# Patient Record
Sex: Female | Born: 1955 | Race: Black or African American | Hispanic: No | Smoking: Former smoker
Health system: Southern US, Community
[De-identification: ages and names within clinical notes are randomized; demographics above are authoritative.]

## PROBLEM LIST (undated history)

## (undated) DIAGNOSIS — F32A Depression, unspecified: Secondary | ICD-10-CM

## (undated) DIAGNOSIS — F419 Anxiety disorder, unspecified: Secondary | ICD-10-CM

## (undated) DIAGNOSIS — M5030 Other cervical disc degeneration, unspecified cervical region: Secondary | ICD-10-CM

## (undated) DIAGNOSIS — M810 Age-related osteoporosis without current pathological fracture: Secondary | ICD-10-CM

## (undated) DIAGNOSIS — M797 Fibromyalgia: Secondary | ICD-10-CM

## (undated) DIAGNOSIS — M179 Osteoarthritis of knee, unspecified: Secondary | ICD-10-CM

## (undated) DIAGNOSIS — K635 Polyp of colon: Secondary | ICD-10-CM

## (undated) DIAGNOSIS — E785 Hyperlipidemia, unspecified: Secondary | ICD-10-CM

## (undated) DIAGNOSIS — E042 Nontoxic multinodular goiter: Secondary | ICD-10-CM

## (undated) DIAGNOSIS — M1710 Unilateral primary osteoarthritis, unspecified knee: Secondary | ICD-10-CM

## (undated) DIAGNOSIS — K219 Gastro-esophageal reflux disease without esophagitis: Secondary | ICD-10-CM

## (undated) DIAGNOSIS — M069 Rheumatoid arthritis, unspecified: Secondary | ICD-10-CM

## (undated) DIAGNOSIS — M940 Chondrocostal junction syndrome [Tietze]: Secondary | ICD-10-CM

## (undated) DIAGNOSIS — D472 Monoclonal gammopathy: Secondary | ICD-10-CM

## (undated) HISTORY — DX: Anxiety disorder, unspecified: F41.9

## (undated) HISTORY — DX: Hyperlipidemia, unspecified: E78.5

## (undated) HISTORY — DX: Rheumatoid arthritis, unspecified: M06.9

## (undated) HISTORY — DX: Other cervical disc degeneration, unspecified cervical region: M50.30

## (undated) HISTORY — DX: Unilateral primary osteoarthritis, unspecified knee: M17.10

## (undated) HISTORY — DX: Fibromyalgia: M79.7

## (undated) HISTORY — PX: POLYPECTOMY: SHX149

## (undated) HISTORY — DX: Age-related osteoporosis without current pathological fracture: M81.0

## (undated) HISTORY — DX: Monoclonal gammopathy: D47.2

## (undated) HISTORY — DX: Depression, unspecified: F32.A

## (undated) HISTORY — DX: Polyp of colon: K63.5

## (undated) HISTORY — DX: Osteoarthritis of knee, unspecified: M17.9

## (undated) HISTORY — DX: Chondrocostal junction syndrome (tietze): M94.0

## (undated) HISTORY — DX: Nontoxic multinodular goiter: E04.2

---

## 2010-02-09 HISTORY — PX: COLONOSCOPY: SHX174

## 2013-02-09 HISTORY — PX: COLONOSCOPY: SHX174

## 2016-07-24 ENCOUNTER — Encounter: Payer: Self-pay | Admitting: Internal Medicine

## 2016-07-24 ENCOUNTER — Ambulatory Visit: Payer: BLUE CROSS/BLUE SHIELD | Attending: Internal Medicine | Admitting: Internal Medicine

## 2016-07-24 VITALS — BP 129/78 | HR 78 | Temp 98.0°F | Resp 17 | Ht 65.9 in | Wt 133.0 lb

## 2016-07-24 DIAGNOSIS — Z8249 Family history of ischemic heart disease and other diseases of the circulatory system: Secondary | ICD-10-CM | POA: Insufficient documentation

## 2016-07-24 DIAGNOSIS — Z8371 Family history of colonic polyps: Secondary | ICD-10-CM | POA: Diagnosis not present

## 2016-07-24 DIAGNOSIS — H209 Unspecified iridocyclitis: Secondary | ICD-10-CM | POA: Diagnosis not present

## 2016-07-24 DIAGNOSIS — M069 Rheumatoid arthritis, unspecified: Secondary | ICD-10-CM | POA: Insufficient documentation

## 2016-07-24 DIAGNOSIS — E042 Nontoxic multinodular goiter: Secondary | ICD-10-CM | POA: Insufficient documentation

## 2016-07-24 DIAGNOSIS — M940 Chondrocostal junction syndrome [Tietze]: Secondary | ICD-10-CM | POA: Insufficient documentation

## 2016-07-24 DIAGNOSIS — E559 Vitamin D deficiency, unspecified: Secondary | ICD-10-CM | POA: Diagnosis not present

## 2016-07-24 DIAGNOSIS — Z87898 Personal history of other specified conditions: Secondary | ICD-10-CM

## 2016-07-24 DIAGNOSIS — H2013 Chronic iridocyclitis, bilateral: Secondary | ICD-10-CM | POA: Diagnosis not present

## 2016-07-24 DIAGNOSIS — E785 Hyperlipidemia, unspecified: Secondary | ICD-10-CM | POA: Insufficient documentation

## 2016-07-24 DIAGNOSIS — Z86010 Personal history of colonic polyps: Secondary | ICD-10-CM | POA: Diagnosis not present

## 2016-07-24 DIAGNOSIS — Z880 Allergy status to penicillin: Secondary | ICD-10-CM | POA: Diagnosis not present

## 2016-07-24 DIAGNOSIS — Z87891 Personal history of nicotine dependence: Secondary | ICD-10-CM | POA: Diagnosis not present

## 2016-07-24 DIAGNOSIS — M179 Osteoarthritis of knee, unspecified: Secondary | ICD-10-CM | POA: Insufficient documentation

## 2016-07-24 DIAGNOSIS — R05 Cough: Secondary | ICD-10-CM | POA: Insufficient documentation

## 2016-07-24 DIAGNOSIS — M810 Age-related osteoporosis without current pathological fracture: Secondary | ICD-10-CM | POA: Diagnosis not present

## 2016-07-24 DIAGNOSIS — M1710 Unilateral primary osteoarthritis, unspecified knee: Secondary | ICD-10-CM | POA: Diagnosis not present

## 2016-07-24 DIAGNOSIS — E7849 Other hyperlipidemia: Secondary | ICD-10-CM | POA: Insufficient documentation

## 2016-07-24 DIAGNOSIS — K635 Polyp of colon: Secondary | ICD-10-CM

## 2016-07-24 DIAGNOSIS — D472 Monoclonal gammopathy: Secondary | ICD-10-CM | POA: Insufficient documentation

## 2016-07-24 DIAGNOSIS — Z8742 Personal history of other diseases of the female genital tract: Secondary | ICD-10-CM | POA: Insufficient documentation

## 2016-07-24 DIAGNOSIS — M5412 Radiculopathy, cervical region: Secondary | ICD-10-CM

## 2016-07-24 MED ORDER — PRAVASTATIN SODIUM 20 MG PO TABS: ORAL_TABLET | ORAL | 3 refills | Status: DC

## 2016-07-24 MED ORDER — FOLIC ACID 1 MG PO TABS: 1.0000 mg | ORAL_TABLET | Freq: Every day | ORAL | 2 refills | Status: DC

## 2016-07-24 MED ORDER — PREDNISONE 1 MG PO TABS: 4.0000 mg | ORAL_TABLET | Freq: Every day | ORAL | 2 refills | Status: DC

## 2016-07-24 NOTE — Patient Instructions (Signed)
Please sign a release for [place] to get your records from your previous primary physician in [place].  I have submitted a referral for you to see the rheumatologist.  Restart Pravachol. Take it every other day. Please let me know if you tolerate the medication.

## 2016-07-24 NOTE — Progress Notes (Signed)
Patient ID: Vickie Pearson, female    DOB: 1955/05/28  MRN: [id number]  CC: Establish Care and Arthritis (Needs medication)   Subjective: Vickie Pearson is a 61 y.o. female who presents for new patient visit. Her concerns today include:  Relocated from [place] to [place] end of April. Hx of RA, osteoporosis, degenerative disc disease of the C-spine, OA knee, osteoporosis, HL, vit D def, thyroid nodules chronic iritis of both eyes and monoclonal gammopathy.  She brings a summary of her medical history with her from her previous physician Dr. Jenene Slicker  1. Taking Z-pack since yesterday.  Seen at [facility] for upper respiratory symptoms. Diagnosed with sinusitis.  Also reports being told that she has costochondritis as she was having some tenderness in the chest with coughing.    2. RA: -Was being followed by rheumatologist. Currently on methotrexate 25 mg weekly, folic acid, prednisone 4 mg daily. The prednisone was being tapered. -had flare day after she moved to [place]. Hands, wrist and knees swollen.  Inc. Pred to 8 mg for 3 days with good results Requesting referral to a rheumatologist here in the area  3.  Osteoporosis/Vit D def:  Taking vit d 4000 QOD. -Had Reclast injection a few mths ago -Reports having had a bone density study prior to injection   4.  Reports some radicular symptoms (numbness and tingling) in the left arm and hand. Had MRI done earlier this year that revealed DDD of the C-spine C5-7 -Prescribed gabapentin but she takes only as needed  5.  HL: she stop taking Pravachol x 1 yr -cause cramping in legs. Last lipid profile done 1/2018T cholesterol 289/LDL 180  6.  Thyroid nodules: Discovered to have several nodules in the left lobe in 2014. Sizes were [phone] cm inferior complex nodule, left middle lobe subcentimeter nodule, left superior lobe complex nodule [phone] cm. Seen by endocrinologist. No FNA recommended. Repeat  ultrasound in 2016 showed that the nodules were smaller in size. Last ultrasound done 03/2016 nodules were stable. Recommended repeat ultrasound in 2 years.  HM: Last Pap March/2018 was normal per patient she will need repeat in 5 years. Does have a history of ASCUS favoring dysplasia in 2010 with colpo biopsy showing benign findings at that time. Colonoscopy/due again in 2020. History of colon polyps. MMG done 05/2016  Patient Active Problem List   Diagnosis Date Noted  . Rheumatoid arthritis involving multiple sites ([place]) 07/24/2016  . Cervical radiculopathy 07/24/2016  . Hyperlipidemia 07/24/2016  . Osteoporosis 07/24/2016  . Vitamin D deficiency 07/24/2016  . Multiple thyroid nodules 07/24/2016     No current outpatient prescriptions on file prior to visit.   No current facility-administered medications on file prior to visit.     Allergies  Allergen Reactions  . Penicillins Hives  . Piroxicam Hives  . Sulfa Antibiotics Hives    Social History   Social History  . Marital status: Widowed    Spouse name: N/A  . Number of children: N/A  . Years of education: N/A   Occupational History  . Not on file.   Social History Main Topics  . Smoking status: Former Smoker    Quit date: 07/24/1988  . Smokeless tobacco: Never Used  . Alcohol use No  . Drug use: No  . Sexual activity: Not on file   Other Topics Concern  . Not on file   Social History Narrative  . No narrative on file    Family History  Problem Relation Age  of Onset  . Hypertension Mother   . Colon polyps Mother   . Hypertension Father     History reviewed. No pertinent surgical history.  ROS: Review of Systems  Constitutional: Negative for activity change and appetite change.  Respiratory: Positive for cough. Negative for chest tightness and shortness of breath.   Cardiovascular: Negative for chest pain.  Musculoskeletal: Positive for neck pain. Negative for arthralgias.    PHYSICAL EXAM: BP  129/78 (BP Location: Left Arm, Patient Position: Sitting, Cuff Size: Normal)   Pulse 78   Temp 98 F (36.7 C) (Oral)   Resp 17   Ht 5' 5.9" (1.674 m)   Wt 133 lb (60.3 kg)   SpO2 98%   BMI 21.53 kg/m   Physical Exam  General appearance - alert, well appearing, older AAF and in no distress Mental status - alert, oriented to person, place, and time, normal mood, behavior, speech, dress, motor activity, and thought processes Mouth -dentures above Neck - supple, no significant adenopathy. No thyroid enlargement Chest - clear to auscultation, no wheezes, rales or rhonchi, symmetric air entry Heart - normal rate, regular rhythm, normal S1, S2, no murmurs, rubs, clicks or gallops Musculoskeletal - mild enlargement of PIP jts most pronounce LT index. No signs of active inflam. Extremities - peripheral pulses normal, no pedal edema, no clubbing or cyanosis  ASSESSMENT AND PLAN: 1. Rheumatoid arthritis involving multiple sites, unspecified rheumatoid factor presence ([place]) -Patient to continue her current medications until she gets established with a rheumatologist in the area. Currently she has no signs of disease activity - predniSONE (DELTASONE) 1 MG tablet; Take 4 tablets (4 mg total) by mouth daily with breakfast.  Dispense: 120 tablet; Refill: 2 - folic acid (FOLVITE) 1 MG tablet; Take 1 tablet (1 mg total) by mouth daily.  Dispense: 100 tablet; Refill: 2 - Comprehensive metabolic panel - CBC - Ambulatory referral to Rheumatology  2. Cervical radiculopathy -Continue gabapentin when necessary  3. Hyperlipidemia, unspecified hyperlipidemia type -Encouraged her to try statin again taking it every other day to see if she tolerates it. - pravastatin (PRAVACHOL) 20 MG tablet; Take one tab every other day.  Dispense: 60 tablet; Refill: 3  4. Osteoporosis, unspecified osteoporosis type, unspecified pathological fracture presence -Reclast injection this past spring. -Reportedly had a bone  density study this spring also. 5. Vitamin D deficiency - VITAMIN D 25 Hydroxy (Vit-D Deficiency, Fractures)  6. Multiple thyroid nodules -Per her old records she will be due for a follow-up ultrasound in 2020  I have asked patient to sign a release for me to get her full records from her previous physician. Patient was given the opportunity to ask questions.  Patient verbalized understanding of the plan and was able to repeat key elements of the plan.   Orders Placed This Encounter  Procedures  . Comprehensive metabolic panel  . CBC  . VITAMIN D 25 Hydroxy (Vit-D Deficiency, Fractures)  . Ambulatory referral to Rheumatology     Requested Prescriptions   Signed Prescriptions Disp Refills  . pravastatin (PRAVACHOL) 20 MG tablet 60 tablet 3    Sig: Take one tab every other day.  . predniSONE (DELTASONE) 1 MG tablet 120 tablet 2    Sig: Take 4 tablets (4 mg total) by mouth daily with breakfast.  . folic acid (FOLVITE) 1 MG tablet 100 tablet 2    Sig: Take 1 tablet (1 mg total) by mouth daily.    Return in about 7 weeks (around 09/11/2016).  Karle Plumber, MD, FACP

## 2016-07-25 LAB — CBC
Hematocrit: 40.5 % (ref 34.0–46.6)
Hemoglobin: 13.9 g/dL (ref 11.1–15.9)
MCH: 33.3 pg — ABNORMAL HIGH (ref 26.6–33.0)
MCHC: 34.3 g/dL (ref 31.5–35.7)
MCV: 97 fL (ref 79–97)
PLATELETS: 256 10*3/uL (ref 150–379)
RBC: 4.18 x10E6/uL (ref 3.77–5.28)
RDW: 15.6 % — AB (ref 12.3–15.4)
WBC: 5.8 10*3/uL (ref 3.4–10.8)

## 2016-07-25 LAB — COMPREHENSIVE METABOLIC PANEL
ALBUMIN: 4.5 g/dL (ref 3.6–4.8)
ALK PHOS: 65 IU/L (ref 39–117)
ALT: 10 IU/L (ref 0–32)
AST: 15 IU/L (ref 0–40)
Albumin/Globulin Ratio: 1.5 (ref 1.2–2.2)
BILIRUBIN TOTAL: 0.3 mg/dL (ref 0.0–1.2)
BUN / CREAT RATIO: 11 — AB (ref 12–28)
BUN: 9 mg/dL (ref 8–27)
CHLORIDE: 102 mmol/L (ref 96–106)
CO2: 25 mmol/L (ref 20–29)
CREATININE: 0.79 mg/dL (ref 0.57–1.00)
Calcium: 9.8 mg/dL (ref 8.7–10.3)
GFR calc non Af Amer: 80 mL/min/{1.73_m2} (ref >59)
GFR, EST AFRICAN AMERICAN: 93 mL/min/{1.73_m2} (ref >59)
GLOBULIN, TOTAL: 3.1 g/dL (ref 1.5–4.5)
Glucose: 99 mg/dL (ref 65–99)
Potassium: 4 mmol/L (ref 3.5–5.2)
SODIUM: 140 mmol/L (ref 134–144)
TOTAL PROTEIN: 7.6 g/dL (ref 6.0–8.5)

## 2016-07-25 LAB — VITAMIN D 25 HYDROXY (VIT D DEFICIENCY, FRACTURES): Vit D, 25-Hydroxy: 35.7 ng/mL (ref 30.0–100.0)

## 2016-07-29 ENCOUNTER — Telehealth: Payer: Self-pay

## 2016-07-29 NOTE — Telephone Encounter (Signed)
Contacted pt to go over lab results pt is aware of results and doesn't have any questions or concerns or concerns

## 2016-07-30 ENCOUNTER — Ambulatory Visit: Payer: Self-pay | Admitting: Internal Medicine

## 2016-08-04 ENCOUNTER — Encounter: Payer: Self-pay | Admitting: Internal Medicine

## 2016-08-10 ENCOUNTER — Other Ambulatory Visit: Payer: Self-pay | Admitting: Internal Medicine

## 2016-08-10 ENCOUNTER — Encounter: Payer: Self-pay | Admitting: Internal Medicine

## 2016-08-10 MED ORDER — METHOTREXATE SODIUM 2.5 MG PO TABS: 25.0000 mg | ORAL_TABLET | ORAL | 2 refills | Status: DC

## 2016-08-10 NOTE — Progress Notes (Signed)
Pt requested RF on MTX through Mychart.Marland Kitchen

## 2016-08-10 NOTE — Telephone Encounter (Signed)
Patient request

## 2016-09-15 ENCOUNTER — Ambulatory Visit: Payer: BLUE CROSS/BLUE SHIELD | Admitting: Internal Medicine

## 2016-09-29 NOTE — Progress Notes (Signed)
Office Visit Note  Patient: Vickie Pearson             Date of Birth: 1955/05/12           MRN: [id number]             PCP: Ladell Pier, MD Referring: Ladell Pier, MD Visit Date: 10/02/2016 Occupation: Retired    Subjective:  Pain in multiple joints.   History of Present Illness: Vickie Pearson is a 61 y.o. female seen in consultation per request of her PCP. According to patient she was diagnosed with rheumatoid arthritis and 1980s. Her symptoms started with a rash and joint pain and inflammation. She recalls having joint pain and swelling in her wrists joints hands and knee joints. She was having difficulty walking at the time. She was living in [place] and was seeing a rheumatologist there. She was started on prednisone and Plaquenil and then later on on methotrexate she did quite well for a while. She stopped her medications and lost follow-up with the rheumatologist for about 15 years. She was seeing her PCP and was treated with anti-inflammatories. She was initially taking ibuprofen and later nabumetone. About 2 years ago her symptoms flared and she started having increased pain in her neck, shoulders, wrist joints, hands, knees hip joints and feet. She saw her rheumatologist again and was restarted on methotrexate and prednisone. She was on prednisone 10 mg a day with gradual taper. She has tapered prednisone down to 1 mg now. Biologics were discussed with her but not a started. She'll also seen an orthopedic surgeon there who advised bilateral total knee replacement. She has had cortisone injections to her knee joints in the past. The last cortisone injection was in January 2018.Marland Kitchen She moved to [facility] in April 2018. She continues to have pain in multiple joints as described above. She developed an episode of iritis about 2 years ago for which she was seeing an ophthalmologist. She was given some eyedrops initially. Now she's been using over-the-counter  eyedrops. She has not established with an ophthalmologist here.  Activities of Daily Living:  Patient reports morning stiffness for 15 minutes.   Patient Reports nocturnal pain.  Difficulty dressing/grooming: Denies Difficulty climbing stairs: Reports Difficulty getting out of chair: Reports Difficulty using hands for taps, buttons, cutlery, and/or writing: Reports   Review of Systems  Constitutional: Negative for fatigue, night sweats, weight gain, weight loss and weakness.  HENT: Positive for mouth dryness. Negative for mouth sores, trouble swallowing, trouble swallowing and nose dryness.   Eyes: Positive for dryness. Negative for pain, redness and visual disturbance.  Respiratory: Negative for cough, shortness of breath and difficulty breathing.   Cardiovascular: Negative for chest pain, palpitations, hypertension, irregular heartbeat and swelling in legs/feet.  Gastrointestinal: Negative for blood in stool, constipation and diarrhea.  Endocrine: Negative for increased urination.  Genitourinary: Negative for vaginal dryness.  Musculoskeletal: Positive for arthralgias, joint pain, joint swelling and morning stiffness. Negative for myalgias, muscle weakness, muscle tenderness and myalgias.  Skin: Negative for color change, rash, hair loss, skin tightness, ulcers and sensitivity to sunlight.  Allergic/Immunologic: Negative for susceptible to infections.  Neurological: Negative for dizziness, memory loss and night sweats.  Hematological: Negative for swollen glands.  Psychiatric/Behavioral: Positive for sleep disturbance. Negative for depressed mood. The patient is not nervous/anxious.     PMFS History:  Patient Active Problem List   Diagnosis Date Noted  . Rheumatoid arthritis involving multiple sites ([place]) 07/24/2016  . Cervical radiculopathy 07/24/2016  .  Hyperlipidemia 07/24/2016  . Osteoporosis 07/24/2016  . Vitamin D deficiency 07/24/2016  . Multiple thyroid nodules  07/24/2016  . Monoclonal gammopathy 07/24/2016  . Colon polyps 07/24/2016  . 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 Hx of abnormal cervical Pap smear 07/24/2016  . OA (osteoarthritis) of knee 07/24/2016    Past Medical History:  Diagnosis Date  . DDD (degenerative disc disease), cervical   . Degenerative disc disease, cervical   . Hyperlipidemia   . Multiple thyroid nodules     Family History  Problem Relation Age of Onset  . Hypertension Mother   . Colon polyps Mother   . Hypertension Father    History reviewed. No pertinent surgical history. Social History   Social History Narrative  . No narrative on file     Objective: Vital Signs: BP 130/68 (BP Location: Right Arm)   Pulse 74   Resp 14   Ht 5\' 6"  (1.676 m)   Wt 140 lb (63.5 kg)   BMI 22.60 kg/m    Physical Exam  Constitutional: She is oriented to person, place, and time. She appears well-developed and well-nourished.  HENT:  Head: Normocephalic and atraumatic.  Eyes: Conjunctivae and EOM are normal.  Neck: Normal range of motion.  Cardiovascular: Normal rate, regular rhythm, normal heart sounds and intact distal pulses.   Pulmonary/Chest: Effort normal and breath sounds normal.  Abdominal: Soft. Bowel sounds are normal.  Lymphadenopathy:    She has no cervical adenopathy.  Neurological: She is alert and oriented to person, place, and time.  Skin: Skin is warm and dry. Capillary refill takes less than 2 seconds.  Psychiatric: She has a normal mood and affect. Her behavior is normal.  Nursing note and vitals reviewed.    Musculoskeletal Exam: C-spine and thoracic lumbar spine good range of motion. She has good range of motion of her shoulder joints and elbow joints. She has limited range of motion of her wrist joints with synovitis over bilateral wrist joints. Some synovitis over MCPs and PIPs was noted which is described below. She has limited range of motion of her hip joints with some discomfort. She has  limited extension of her bilateral knee joints with some warmth and swelling in her left knee. She is some tenderness over her ankle joints. Tenderness across the MTPs was noted. But no synovitis was noted.  CDAI Exam: CDAI Homunculus Exam:   Tenderness:  RUE: wrist LUE: wrist Right hand: 2nd MCP and 2nd PIP Left hand: 2nd MCP and 2nd PIP RLE: tibiofemoral LLE: tibiofemoral  Swelling:  RUE: wrist LUE: wrist Right hand: 2nd MCP Left hand: 2nd MCP RLE: tibiofemoral LLE: tibiofemoral  Joint Counts:  CDAI Tender Joint count: 8 CDAI Swollen Joint count: 6  Global Assessments:  Patient Global Assessment: 6 Provider Global Assessment: 6  CDAI Calculated Score: 26    Investigation: No additional findings. CBC Latest Ref Rng & Units 07/24/2016  WBC 3.4 - 10.8 x10E3/uL 5.8  Hemoglobin 11.1 - 15.9 g/dL 13.9  Hematocrit 34.0 - 46.6 % 40.5  Platelets 150 - 379 x10E3/uL 256   CMP Latest Ref Rng & Units 07/24/2016  Glucose 65 - 99 mg/dL 99  BUN 8 - 27 mg/dL 9  Creatinine 0.57 - 1.00 mg/dL 0.79  Sodium 134 - 144 mmol/L 140  Potassium 3.5 - 5.2 mmol/L 4.0  Chloride 96 - 106 mmol/L 102  CO2 20 - 29 mmol/L 25  Calcium 8.7 - 10.3 mg/dL 9.8  Total Protein 6.0 - 8.5  g/dL 7.6  Total Bilirubin 0.0 - 1.2 mg/dL 0.3  Alkaline Phos 39 - 117 IU/L 65  AST 0 - 40 IU/L 15  ALT 0 - 32 IU/L 10  Vitamin D 35.7 02/12/2016 anti-CCP negative Imaging: Xr Foot Complete Left  Result Date: 10/02/2016 PIP DIP narrowing was noted. First MTP narrowing was noted. No MTP erosions were noted. Metatarsotarsal and intertarsal joint space narrowing was noted. Dorsal spurring was noted. subtalar joint space narrowing was noted. Impression: These findings are consistent with rheumatoid arthritis  Xr Hip Unilat W Or W/o Pelvis 2-3 Views Left  Result Date: 10/02/2016 No hip joint narrowing or SI joint narrowing was noted.  Xr Hip Unilat W Or W/o Pelvis 2-3 Views Right  Result Date: 10/02/2016 No hip joint  narrowing was noted. No SI joint narrowing was noted.  Xr Foot Complete Right  Result Date: 10/02/2016 Tibiotarsal and subtarsal joint space severe narrowing was noted. Dorsal spurring was noted. Severe metatarsal tarsal intertarsal narrowing was noted. PIP/DIP narrowing was noted. No MTP joint narrowing or erosive changes were noted. No erosive changes were noted. Impression: These findings are consistent with rheumatoid arthritis.  Xr Hand 2 View Left  Result Date: 10/02/2016 PIP DIP narrowing was noted. Most prominent in the left second PIP and second DIP. Juxta articular osteopenia was needed noted. No significant MCP joint narrowing was noted. She has severe narrowing of intercarpal and radiocarpal joints. No erosive changes were noted. Impression: These findings are consistent with rheumatoid arthritis and osteoarthritis overlap.  Xr Hand 2 View Right  Result Date: 10/02/2016 PIP DIP narrowing was noted. Right third MCP narrowing was noted. She has severe intercarpal joint metacarpocarpal joint radiocarpal joint narrowing. No erosive changes were noted. Juxta articular osteopenia was noted. Impression: These findings are consistent with rheumatoid arthritis and osteoarthritis overlap.  Xr Knee 3 View Left  Result Date: 10/02/2016 Severe medial lateral compartment narrowing and severe patellofemoral narrowing was noted. 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    Procedures:  No procedures performed Allergies: Penicillins; Piroxicam; and Sulfa antibiotics   Assessment / Plan:     Visit Diagnoses: Rheumatoid  arthritis involving multiple sites, unspecified rheumatoid factor presence ([place]) -patient has severe rheumatoid arthritis with ongoing synovitis and multiple contractures. She is incomplete fist formation and decreased range of motion in her bilateral wrist joints. She has limitation of range of motion of bilateral hip joints. I'll obtain some baseline labs to start her on DMARD's and the following x-rays. Plan: XR Foot Complete Left, XR Foot Complete Right, XR HIP UNILAT W OR W/O PELVIS 2-3 VIEWS LEFT, XR HIP UNILAT W OR W/O PELVIS 2-3 VIEWS RIGHT, XR Hand 2 View Left, XR Hand 2 View Right, XR KNEE 3 VIEW LEFT, XR KNEE 3 VIEW RIGHT  High risk medication use - Methotrexate 8 tablets by mouth every week, folic acid 1 mg by mouth daily, prednisone 1 mg by mouth daily. A handout was given on methotrexate consent was taken. Once the lab results are available we will be able to refill her medication. She's been also advised to get a baseline chest x-ray. She is advised to get pneumococcal vaccine and also zoster vaccine. She may need more aggressive therapy.  On prednisone therapy - 1 mg per day   Chronic iritis of both eyes: She is currently not taking any medication for her iritis.  Osteoporosis: She is currently on no treatment.  Vitamin D deficiency: She states she's been taking vitamin D supplements.  Monoclonal gammopathy: She's not seeing a hematologist currently. She will need a pathology follow-up.  Multiple thyroid nodules  History of hyperlipidemia    Orders: Orders Placed This Encounter  Procedures  . XR Foot Complete Left  . XR Foot Complete Right  . XR HIP UNILAT W OR W/O PELVIS 2-3 VIEWS LEFT  . XR HIP UNILAT W OR W/O PELVIS 2-3 VIEWS RIGHT  . XR Hand 2 View Left  . XR Hand 2 View Right  . XR KNEE 3 VIEW LEFT  . XR KNEE 3 VIEW RIGHT  . DG Chest 2 View  . COMPLETE METABOLIC PANEL WITH GFR  . Urinalysis, Routine w reflex microscopic  . Urinalysis, Routine w reflex microscopic   . Sedimentation rate  . CK  . Antinuclear Antib (ANA)  . Rheumatoid Factor  . VITAMIN D 25 Hydroxy (Vit-D Deficiency, Fractures)  . Hepatitis panel, acute  . Quantiferon tb gold assay (blood)  . HIV antibody  . Immunofixation electrophoresis  . CBC with Differential/Platelet  . CBC with Differential/Platelet  . COMPLETE METABOLIC PANEL WITH GFR   No orders of the defined types were placed in this encounter.   Face-to-face time spent with patient was 50 minutes. 50% of time was spent in counseling and coordination of care.  Follow-Up Instructions: Return for Rheumatoid arthritis.   Bo Merino, MD  Note - This record has been created using Editor, commissioning.  Chart creation errors have been sought, but may not always  have been located. Such creation errors do not reflect on  the standard of medical care.

## 2016-10-01 ENCOUNTER — Ambulatory Visit: Payer: BLUE CROSS/BLUE SHIELD | Attending: Internal Medicine | Admitting: Internal Medicine

## 2016-10-01 ENCOUNTER — Encounter: Payer: Self-pay | Admitting: Internal Medicine

## 2016-10-01 VITALS — BP 113/73 | HR 72 | Temp 98.7°F | Resp 16 | Wt 140.6 lb

## 2016-10-01 DIAGNOSIS — E042 Nontoxic multinodular goiter: Secondary | ICD-10-CM | POA: Insufficient documentation

## 2016-10-01 DIAGNOSIS — Z86010 Personal history of colonic polyps: Secondary | ICD-10-CM | POA: Insufficient documentation

## 2016-10-01 DIAGNOSIS — Z09 Encounter for follow-up examination after completed treatment for conditions other than malignant neoplasm: Secondary | ICD-10-CM | POA: Diagnosis present

## 2016-10-01 DIAGNOSIS — E559 Vitamin D deficiency, unspecified: Secondary | ICD-10-CM | POA: Diagnosis not present

## 2016-10-01 DIAGNOSIS — D472 Monoclonal gammopathy: Secondary | ICD-10-CM | POA: Diagnosis not present

## 2016-10-01 DIAGNOSIS — M5412 Radiculopathy, cervical region: Secondary | ICD-10-CM | POA: Insufficient documentation

## 2016-10-01 DIAGNOSIS — H209 Unspecified iridocyclitis: Secondary | ICD-10-CM | POA: Insufficient documentation

## 2016-10-01 DIAGNOSIS — M810 Age-related osteoporosis without current pathological fracture: Secondary | ICD-10-CM | POA: Insufficient documentation

## 2016-10-01 DIAGNOSIS — M069 Rheumatoid arthritis, unspecified: Secondary | ICD-10-CM

## 2016-10-01 DIAGNOSIS — E785 Hyperlipidemia, unspecified: Secondary | ICD-10-CM | POA: Insufficient documentation

## 2016-10-01 DIAGNOSIS — Z87891 Personal history of nicotine dependence: Secondary | ICD-10-CM | POA: Insufficient documentation

## 2016-10-01 DIAGNOSIS — Z8371 Family history of colonic polyps: Secondary | ICD-10-CM | POA: Insufficient documentation

## 2016-10-01 NOTE — Progress Notes (Signed)
Patient ID: Vickie Pearson, female    DOB: 27-Mar-1955  MRN: [id number]  CC: Follow-up   Subjective: Vickie Pearson is a 61 y.o. female who presents for 2 mth f/u visit Her concerns today include:  Hx of RA, monoclonal gammopathy, HL, Vit D def, osteoporosis,cervical radiculopathy 1. RA:  appt with Rheumatologist tomorrow -doing ok on MTX and folic acid  2. Thyroid Nodules Discovered to have several nodules in the left lobe in 2014. Sizes were [phone] cm inferior complex nodule, left middle lobe subcentimeter nodule, left superior lobe complex nodule [phone] cm. Seen by endocrinologist. No FNA recommended. Repeat ultrasound in 2016 showed that the nodules were smaller in size. Last ultrasound done 03/2016 nodules were stable. Recommended repeat ultrasound in 2 years ie 03/2018  3. HL:  Tolerating Pravachol  4. Stomach grawls a lot. No abdominal pain, N/V  5. Monoclonal gammopathy Found 2010 on w/u of 2ary causes for osteoporosis  Patient Active Problem List   Diagnosis Date Noted  . Rheumatoid arthritis involving multiple sites ([place]) 07/24/2016  . Cervical radiculopathy 07/24/2016  . Hyperlipidemia 07/24/2016  . Osteoporosis 07/24/2016  . Vitamin D deficiency 07/24/2016  . Multiple thyroid nodules 07/24/2016  . Monoclonal gammopathy 07/24/2016  . Colon polyps 07/24/2016  . 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 Hx of abnormal cervical Pap smear 07/24/2016  . OA (osteoarthritis) of knee 07/24/2016     Current Outpatient Prescriptions on File Prior to Visit  Medication Sig Dispense Refill  . azithromycin (ZITHROMAX) 250 MG tablet   0  . folic acid (FOLVITE) 1 MG tablet Take 1 tablet (1 mg total) by mouth daily. 100 tablet 2  . gabapentin (NEURONTIN) 300 MG capsule Take 300 mg by mouth as needed.    . methotrexate 2.5 MG tablet Take 10 tablets (25 mg total) by mouth once a week. 40 tablet 2  . pravastatin (PRAVACHOL) 20 MG tablet Take one tab every  other day. 60 tablet 3  . predniSONE (DELTASONE) 1 MG tablet Take 4 tablets (4 mg total) by mouth daily with breakfast. 120 tablet 2   No current facility-administered medications on file prior to visit.     Allergies  Allergen Reactions  . Penicillins Hives  . Piroxicam Hives  . Sulfa Antibiotics Hives    Social History   Social History  . Marital status: Widowed    Spouse name: N/A  . Number of children: N/A  . Years of education: N/A   Occupational History  . Not on file.   Social History Main Topics  . Smoking status: Former Smoker    Quit date: 07/24/1988  . Smokeless tobacco: Never Used  . Alcohol use No  . Drug use: No  . Sexual activity: Not on file   Other Topics Concern  . Not on file   Social History Narrative  . No narrative on file    Family History  Problem Relation Age of Onset  . Hypertension Mother   . Colon polyps Mother   . Hypertension Father     No past surgical history on file.  ROS: Review of Systems Neg except as stated above PHYSICAL EXAM: BP 113/73   Pulse 72   Temp 98.7 F (37.1 C) (Oral)   Resp 16   Wt 140 lb 9.6 oz (63.8 kg)   SpO2 99%   BMI 22.76 kg/m   Wt Readings from Last 3 Encounters:  10/01/16 140 lb 9.6 oz (63.8 kg)  07/24/16 133 lb (60.3  kg)   Physical Exam  General appearance - alert, well appearing, older AAF and in no distress Mental status - alert, oriented to person, place, and time, normal mood, behavior, speech, dress, motor activity, and thought processes Mouth -dentures above Neck - supple, no significant adenopathy. No thyroid enlargement Chest - clear to auscultation Heart - RRR Abdomen - normal bowel sounds, soft, NT Musculoskeletal - mild enlargement of PIP jts most pronounce LT index. No signs of active inflam. Extremities - no LE edema  Results for orders placed or performed in visit on 07/24/16  Comprehensive metabolic panel  Result Value Ref Range   Glucose 99 65 - 99 mg/dL   BUN 9 8  - 27 mg/dL   Creatinine, Ser 0.79 0.57 - 1.00 mg/dL   GFR calc non Af Amer 80 >59 mL/min/1.73   GFR calc Af Amer 93 >59 mL/min/1.73   BUN/Creatinine Ratio 11 (L) 12 - 28   Sodium 140 134 - 144 mmol/L   Potassium 4.0 3.5 - 5.2 mmol/L   Chloride 102 96 - 106 mmol/L   CO2 25 20 - 29 mmol/L   Calcium 9.8 8.7 - 10.3 mg/dL   Total Protein 7.6 6.0 - 8.5 g/dL   Albumin 4.5 3.6 - 4.8 g/dL   Globulin, Total 3.1 1.5 - 4.5 g/dL   Albumin/Globulin Ratio 1.5 1.2 - 2.2   Bilirubin Total 0.3 0.0 - 1.2 mg/dL   Alkaline Phosphatase 65 39 - 117 IU/L   AST 15 0 - 40 IU/L   ALT 10 0 - 32 IU/L  CBC  Result Value Ref Range   WBC 5.8 3.4 - 10.8 x10E3/uL   RBC 4.18 3.77 - 5.28 x10E6/uL   Hemoglobin 13.9 11.1 - 15.9 g/dL   Hematocrit 40.5 34.0 - 46.6 %   MCV 97 79 - 97 fL   MCH 33.3 (H) 26.6 - 33.0 pg   MCHC 34.3 31.5 - 35.7 g/dL   RDW 15.6 (H) 12.3 - 15.4 %   Platelets 256 150 - 379 x10E3/uL  VITAMIN D 25 Hydroxy (Vit-D Deficiency, Fractures)  Result Value Ref Range   Vit D, 25-Hydroxy 35.7 30.0 - 100.0 ng/mL    ASSESSMENT AND PLAN: 1. Rheumatoid arthritis involving multiple sites, unspecified rheumatoid factor presence ([place]) -keep appt with rheumatologist tomorrow  2. Hyperlipidemia, unspecified hyperlipidemia type -cont Pravachol  3. Multiple thyroid nodules -stable.  4. Monoclonal gammopathy -will check SPEP and refer to heme/onc on future visit  HM: needs HiV/hep C screening. Pt thinks she may have blood test drawn tomorrow by rheumatologist. If so, she will request to have HIV/Hep C screening tests drawn same time.  Patient was given the opportunity to ask questions.  Patient verbalized understanding of the plan and was able to repeat key elements of the plan.   No orders of the defined types were placed in this encounter.    Requested Prescriptions    No prescriptions requested or ordered in this encounter    Return in about 4 months (around 01/31/2017).  Karle Plumber,  MD, FACP

## 2016-10-02 ENCOUNTER — Ambulatory Visit (INDEPENDENT_AMBULATORY_CARE_PROVIDER_SITE_OTHER): Payer: Self-pay

## 2016-10-02 ENCOUNTER — Encounter: Payer: Self-pay | Admitting: Rheumatology

## 2016-10-02 ENCOUNTER — Ambulatory Visit (HOSPITAL_COMMUNITY)
Admission: RE | Admit: 2016-10-02 | Discharge: 2016-10-02 | Disposition: A | Discharge status: Home / Self Care | Payer: BLUE CROSS/BLUE SHIELD | Source: Ambulatory Visit | Attending: Rheumatology | Admitting: Rheumatology

## 2016-10-02 ENCOUNTER — Ambulatory Visit (INDEPENDENT_AMBULATORY_CARE_PROVIDER_SITE_OTHER): Payer: BLUE CROSS/BLUE SHIELD | Admitting: Rheumatology

## 2016-10-02 VITALS — BP 130/68 | HR 74 | Resp 14 | Ht 66.0 in | Wt 140.0 lb

## 2016-10-02 DIAGNOSIS — Z79899 Other long term (current) drug therapy: Secondary | ICD-10-CM

## 2016-10-02 DIAGNOSIS — M069 Rheumatoid arthritis, unspecified: Secondary | ICD-10-CM | POA: Insufficient documentation

## 2016-10-02 DIAGNOSIS — M79642 Pain in left hand: Secondary | ICD-10-CM | POA: Diagnosis not present

## 2016-10-02 DIAGNOSIS — D472 Monoclonal gammopathy: Secondary | ICD-10-CM

## 2016-10-02 DIAGNOSIS — D849 Immunodeficiency, unspecified: Secondary | ICD-10-CM

## 2016-10-02 DIAGNOSIS — H2013 Chronic iridocyclitis, bilateral: Secondary | ICD-10-CM | POA: Diagnosis not present

## 2016-10-02 DIAGNOSIS — M25561 Pain in right knee: Secondary | ICD-10-CM | POA: Diagnosis not present

## 2016-10-02 DIAGNOSIS — M5030 Other cervical disc degeneration, unspecified cervical region: Secondary | ICD-10-CM

## 2016-10-02 DIAGNOSIS — D899 Disorder involving the immune mechanism, unspecified: Secondary | ICD-10-CM | POA: Insufficient documentation

## 2016-10-02 DIAGNOSIS — M79671 Pain in right foot: Secondary | ICD-10-CM

## 2016-10-02 DIAGNOSIS — M79641 Pain in right hand: Secondary | ICD-10-CM | POA: Diagnosis not present

## 2016-10-02 DIAGNOSIS — Z8639 Personal history of other endocrine, nutritional and metabolic disease: Secondary | ICD-10-CM | POA: Diagnosis not present

## 2016-10-02 DIAGNOSIS — M810 Age-related osteoporosis without current pathological fracture: Secondary | ICD-10-CM

## 2016-10-02 DIAGNOSIS — E559 Vitamin D deficiency, unspecified: Secondary | ICD-10-CM | POA: Diagnosis not present

## 2016-10-02 DIAGNOSIS — Z114 Encounter for screening for human immunodeficiency virus [HIV]: Secondary | ICD-10-CM

## 2016-10-02 DIAGNOSIS — M79672 Pain in left foot: Secondary | ICD-10-CM

## 2016-10-02 DIAGNOSIS — M25552 Pain in left hip: Secondary | ICD-10-CM | POA: Diagnosis not present

## 2016-10-02 DIAGNOSIS — M25551 Pain in right hip: Secondary | ICD-10-CM | POA: Diagnosis not present

## 2016-10-02 DIAGNOSIS — Z111 Encounter for screening for respiratory tuberculosis: Secondary | ICD-10-CM | POA: Diagnosis not present

## 2016-10-02 DIAGNOSIS — Z1159 Encounter for screening for other viral diseases: Secondary | ICD-10-CM | POA: Diagnosis not present

## 2016-10-02 DIAGNOSIS — R778 Other specified abnormalities of plasma proteins: Secondary | ICD-10-CM

## 2016-10-02 DIAGNOSIS — Z7952 Long term (current) use of systemic steroids: Secondary | ICD-10-CM

## 2016-10-02 DIAGNOSIS — E042 Nontoxic multinodular goiter: Secondary | ICD-10-CM

## 2016-10-02 DIAGNOSIS — M25562 Pain in left knee: Secondary | ICD-10-CM | POA: Diagnosis not present

## 2016-10-02 DIAGNOSIS — M47812 Spondylosis without myelopathy or radiculopathy, cervical region: Secondary | ICD-10-CM

## 2016-10-02 LAB — CBC WITH DIFFERENTIAL/PLATELET
BASOS ABS: 42 {cells}/uL (ref 0–200)
Basophils Relative: 1 %
EOS PCT: 3 %
Eosinophils Absolute: 126 cells/uL (ref 15–500)
HEMATOCRIT: 39.1 % (ref 35.0–45.0)
HEMOGLOBIN: 12.6 g/dL (ref 11.7–15.5)
LYMPHS ABS: 2142 {cells}/uL (ref 850–3900)
Lymphocytes Relative: 51 %
MCH: 31.7 pg (ref 27.0–33.0)
MCHC: 32.2 g/dL (ref 32.0–36.0)
MCV: 98.5 fL (ref 80.0–100.0)
MONO ABS: 336 {cells}/uL (ref 200–950)
MPV: 9.5 fL (ref 7.5–12.5)
Monocytes Relative: 8 %
NEUTROS ABS: 1554 {cells}/uL (ref 1500–7800)
NEUTROS PCT: 37 %
Platelets: 314 10*3/uL (ref 140–400)
RBC: 3.97 MIL/uL (ref 3.80–5.10)
RDW: 15.3 % — ABNORMAL HIGH (ref 11.0–15.0)
WBC: 4.2 10*3/uL (ref 3.8–10.8)

## 2016-10-02 NOTE — Progress Notes (Signed)
CXR normal.

## 2016-10-02 NOTE — Patient Instructions (Signed)
Go to [facility] for your chest Xray    Standing Labs We placed an order today for your standing lab work.    Please come back and get your standing labs in November   We have open lab Monday through Friday from 8:30-11:30 AM and 1:30-4 PM at the office of Dr. Bo Merino.   The office is located at [address], [place], [place], [place] [address] No appointment is necessary.   Labs are drawn by [facility].  You may receive a bill from [facility] for your lab work. If you have any questions regarding directions or hours of operation,  please call [phone].     Methotrexate tablets What is this medicine? METHOTREXATE (METH oh TREX ate) is a chemotherapy drug used to treat cancer including breast cancer, leukemia, and lymphoma. This medicine can also be used to treat psoriasis and certain kinds of arthritis. This medicine may be used for other purposes; ask your health care provider or pharmacist if you have questions. COMMON BRAND NAME(S): Rheumatrex, Trexall What should I tell my health care provider before I take this medicine? They need to know if you have any of these conditions: -fluid in the stomach area or lungs -if you often drink alcohol -infection or immune system problems -kidney disease or on hemodialysis -liver disease -low blood counts, like low white cell, platelet, or red cell counts -lung disease -radiation therapy -stomach ulcers -ulcerative colitis -an unusual or allergic reaction to methotrexate, other medicines, foods, dyes, or preservatives -pregnant or trying to get pregnant -breast-feeding How should I use this medicine? Take this medicine by mouth with a glass of water. Follow the directions on the prescription label. Take your medicine at regular intervals. Do not take it more often than directed. Do not stop taking except on your doctor's advice. Make sure you know why you are taking this medicine and how often you should take it. If this  medicine is used for a condition that is not cancer, like arthritis or psoriasis, it should be taken weekly, NOT daily. Taking this medicine more often than directed can cause serious side effects, even death. Talk to your healthcare provider about safe handling and disposal of this medicine. You may need to take special precautions. Talk to your pediatrician regarding the use of this medicine in children. While this drug may be prescribed for selected conditions, precautions do apply. Overdosage: If you think you have taken too much of this medicine contact a poison control center or emergency room at once. NOTE: This medicine is only for you. Do not share this medicine with others. What if I miss a dose? If you miss a dose, talk with your doctor or health care professional. Do not take double or extra doses. What may interact with this medicine? This medicine may interact with the following medication: -acitretin -aspirin and aspirin-like medicines including salicylates -azathioprine -certain antibiotics like penicillins, tetracycline, and chloramphenicol -cyclosporine -gold -hydroxychloroquine -live virus vaccines -NSAIDs, medicines for pain and inflammation, like ibuprofen or naproxen -other cytotoxic agents -penicillamine -phenylbutazone -phenytoin -probenecid -retinoids such as isotretinoin and tretinoin -steroid medicines like prednisone or cortisone -sulfonamides like sulfasalazine and trimethoprim/sulfamethoxazole -theophylline This list may not describe all possible interactions. Give your health care provider a list of all the medicines, herbs, non-prescription drugs, or dietary supplements you use. Also tell them if you smoke, drink alcohol, or use illegal drugs. Some items may interact with your medicine. What should I watch for while using this medicine? Avoid  alcoholic drinks. This medicine can make you more sensitive to the sun. Keep out of the sun. If you cannot avoid  being in the sun, wear protective clothing and use sunscreen. Do not use sun lamps or tanning beds/booths. You may need blood work done while you are taking this medicine. Call your doctor or health care professional for advice if you get a fever, chills or sore throat, or other symptoms of a cold or flu. Do not treat yourself. This drug decreases your body's ability to fight infections. Try to avoid being around people who are sick. This medicine may increase your risk to bruise or bleed. Call your doctor or health care professional if you notice any unusual bleeding. Check with your doctor or health care professional if you get an attack of severe diarrhea, nausea and vomiting, or if you sweat a lot. The loss of too much body fluid can make it dangerous for you to take this medicine. Talk to your doctor about your risk of cancer. You may be more at risk for certain types of cancers if you take this medicine. Both men and women must use effective birth control with this medicine. Do not become pregnant while taking this medicine or until at least 1 normal menstrual cycle has occurred after stopping it. Women should inform their doctor if they wish to become pregnant or think they might be pregnant. Men should not father a child while taking this medicine and for 3 months after stopping it. There is a potential for serious side effects to an unborn child. Talk to your health care professional or pharmacist for more information. Do not breast-feed an infant while taking this medicine. What side effects may I notice from receiving this medicine? Side effects that you should report to your doctor or health care professional as soon as possible: -allergic reactions like skin rash, itching or hives, swelling of the face, lips, or tongue -breathing problems or shortness of breath -diarrhea -dry, nonproductive cough -low blood counts - this medicine may decrease the number of white blood cells, red blood cells  and platelets. You may be at increased risk for infections and bleeding. -mouth sores -redness, blistering, peeling or loosening of the skin, including inside the mouth -signs of infection - fever or chills, cough, sore throat, pain or trouble passing urine -signs and symptoms of bleeding such as bloody or black, tarry stools; red or dark-brown urine; spitting up blood or brown material that looks like coffee grounds; red spots on the skin; unusual bruising or bleeding from the eye, gums, or nose -signs and symptoms of kidney injury like trouble passing urine or change in the amount of urine -signs and symptoms of liver injury like dark yellow or brown urine; general ill feeling or flu-like symptoms; light-colored stools; loss of appetite; nausea; right upper belly pain; unusually weak or tired; yellowing of the eyes or skin Side effects that usually do not require medical attention (report to your doctor or health care professional if they continue or are bothersome): -dizziness -hair loss -tiredness -upset stomach -vomiting This list may not describe all possible side effects. Call your doctor for medical advice about side effects. You may report side effects to FDA at 1-800-FDA-1088. Where should I keep my medicine? Keep out of the reach of children. Store at room temperature between 20 and 25 degrees C (68 and 77 degrees F). Protect from light. Throw away any unused medicine after the expiration date. NOTE: This sheet is a summary.  It may not cover all possible information. If you have questions about this medicine, talk to your doctor, pharmacist, or health care provider.  2018 Elsevier/Gold Standard (2014-10-01 05:39:22)

## 2016-10-03 LAB — URINALYSIS, MICROSCOPIC ONLY
BACTERIA UA: NONE SEEN [HPF]
CASTS: NONE SEEN [LPF]
CRYSTALS: NONE SEEN [HPF]
Yeast: NONE SEEN [HPF]

## 2016-10-03 LAB — QUANTIFERON TB GOLD ASSAY (BLOOD)
INTERFERON GAMMA RELEASE ASSAY: NEGATIVE
QUANTIFERON NIL VALUE: 0.04 [IU]/mL
Quantiferon Tb Ag Minus Nil Value: <0 IU/mL

## 2016-10-03 LAB — COMPLETE METABOLIC PANEL WITH GFR
ALT: 14 U/L (ref 6–29)
AST: 17 U/L (ref 10–35)
Albumin: 3.9 g/dL (ref 3.6–5.1)
Alkaline Phosphatase: 53 U/L (ref 33–130)
BUN: 10 mg/dL (ref 7–25)
CALCIUM: 9.4 mg/dL (ref 8.6–10.4)
CHLORIDE: 104 mmol/L (ref 98–110)
CO2: 29 mmol/L (ref 20–32)
CREATININE: 0.7 mg/dL (ref 0.50–0.99)
GFR, Est African American: >89 mL/min (ref >=60)
GFR, Est Non African American: >89 mL/min (ref >=60)
GLUCOSE: 79 mg/dL (ref 65–99)
Potassium: 4.6 mmol/L (ref 3.5–5.3)
SODIUM: 140 mmol/L (ref 135–146)
Total Bilirubin: 0.3 mg/dL (ref 0.2–1.2)
Total Protein: 6.5 g/dL (ref 6.1–8.1)

## 2016-10-03 LAB — HEPATITIS PANEL, ACUTE
HCV Ab: NONREACTIVE
Hep A IgM: 0
Hep B C IgM: NONREACTIVE
Hepatitis B Surface Ag: NONREACTIVE

## 2016-10-03 LAB — URINALYSIS, ROUTINE W REFLEX MICROSCOPIC
BILIRUBIN URINE: NEGATIVE
GLUCOSE, UA: NEGATIVE
Hgb urine dipstick: NEGATIVE
KETONES UR: NEGATIVE
Nitrite: NEGATIVE
PH: 7 (ref 5.0–8.0)
Protein, ur: NEGATIVE
SPECIFIC GRAVITY, URINE: 1.017 (ref 1.001–1.035)

## 2016-10-03 LAB — VITAMIN D 25 HYDROXY (VIT D DEFICIENCY, FRACTURES): Vit D, 25-Hydroxy: 27 ng/mL — ABNORMAL LOW (ref 30–100)

## 2016-10-03 LAB — HIV ANTIBODY (ROUTINE TESTING W REFLEX): HIV: NONREACTIVE

## 2016-10-03 LAB — SEDIMENTATION RATE: SED RATE: 7 mm/h (ref 0–30)

## 2016-10-03 LAB — CK: Total CK: 46 U/L (ref 29–143)

## 2016-10-05 LAB — ANA: ANA: NEGATIVE

## 2016-10-05 LAB — RHEUMATOID FACTOR

## 2016-10-07 LAB — IMMUNOFIXATION ELECTROPHORESIS
IMMUNOFIX ELECTR INT: DETECTED
IMMUNOGLOBULIN A: 233 mg/dL (ref 81–463)
Immunoglobulin G: 1167 mg/dL (ref 694–1618)
Immunoglobulin M: 39 mg/dL — ABNORMAL LOW (ref 48–271)

## 2016-10-07 NOTE — Progress Notes (Signed)
Patient has known history of mono clonal gammopathy. She has abnormal IFE. Please ask patient if she has an appointment with hematology here. If she does not have an appointment either she can get a referral from PCP or from [place]. I will discuss rest the labs at follow-up visit.

## 2016-10-08 ENCOUNTER — Telehealth: Payer: Self-pay | Admitting: Radiology

## 2016-10-08 DIAGNOSIS — R778 Other specified abnormalities of plasma proteins: Secondary | ICD-10-CM

## 2016-10-08 NOTE — Telephone Encounter (Signed)
-----   Message from Bo Merino, MD sent at 10/07/2016  1:22 PM EDT ----- Patient has known history of mono clonal gammopathy. She has abnormal IFE. Please ask patient if she has an appointment with hematology here. If she does not have an appointment either she can get a referral from PCP or from [place]. I will discuss rest t he labs at follow-up visit.

## 2016-10-08 NOTE — Telephone Encounter (Signed)
Called pt regarding abnormal SPEP she voiced understanding, and agrees to hematology referral

## 2016-10-26 ENCOUNTER — Telehealth: Payer: Self-pay | Admitting: Rheumatology

## 2016-10-26 NOTE — Telephone Encounter (Signed)
I called patient and gave # to [facility], left message on machine for [facility] to call patient to schedule appt.

## 2016-10-26 NOTE — Telephone Encounter (Signed)
Patient states Dr. Estanislado Pandy referred her to a hematologist, but she still has not heard anything from the referral. Per patient it has been several weeks now. Please call to advise patient on status of referral.

## 2016-10-28 ENCOUNTER — Encounter: Payer: Self-pay | Admitting: Hematology and Oncology

## 2016-10-28 ENCOUNTER — Telehealth: Payer: Self-pay | Admitting: Hematology and Oncology

## 2016-10-28 NOTE — Telephone Encounter (Signed)
Appt has been scheduled for the pt to see Dr. Lebron Conners on 10/1 at 11am. Pt aware to arrive 30 minutes early. Letter mailed.

## 2016-11-02 NOTE — Progress Notes (Signed)
Office Visit Note  Patient: Vickie Pearson             Date of Birth: Apr 19, 1955           MRN: [id number]             PCP: Ladell Pier, MD Referring: Ladell Pier, MD Visit Date: 11/03/2016 Occupation: _0 @    Subjective:  Pain knees.   History of Present Illness: Vickie Pearson is a 61 y.o. female with history of seronegative rheumatoid arthritis and chronic arthritis. She states she continues to have some joint stiffness she denies any joint swelling. She's been having some discomfort in her neck, lower back and her bilateral knee joints. She states her last flare of her arthritis was about a year ago. She's not seen an ophthalmologist years of 4.  Activities of Daily Living:  Patient reports morning stiffness for 15 minute.   Patient Reports nocturnal pain.  Difficulty dressing/grooming: Denies Difficulty climbing stairs: Denies Difficulty getting out of chair: Denies Difficulty using hands for taps, buttons, cutlery, and/or writing: Denies   Review of Systems  Constitutional: Negative.  Negative for fatigue.  HENT: Positive for mouth dryness.   Eyes: Positive for dryness.  Respiratory: Negative.  Negative for cough, shortness of breath and difficulty breathing.   Cardiovascular: Positive for hypertension. Negative for chest pain, palpitations and irregular heartbeat.  Gastrointestinal: Negative.  Negative for blood in stool, constipation and diarrhea.  Endocrine: Negative.   Genitourinary: Negative.  Negative for nocturia.  Musculoskeletal: Positive for arthralgias, joint pain and muscle weakness. Negative for joint swelling, myalgias and myalgias.  Skin: Negative.  Negative for color change, rash, hair loss, ulcers and sensitivity to sunlight.  Neurological: Negative for light-headedness, numbness, headaches and memory loss.  Hematological: Negative.  Negative for swollen glands.  Psychiatric/Behavioral: Positive for sleep disturbance.  Negative for depressed mood. The patient is not nervous/anxious.     PMFS History:  Patient Active Problem List   Diagnosis Date Noted  . Rheumatoid arthritis involving multiple sites ([place]) 07/24/2016  . Cervical radiculopathy 07/24/2016  . Hyperlipidemia 07/24/2016  . Osteoporosis 07/24/2016  . Vitamin D deficiency 07/24/2016  . Multiple thyroid nodules 07/24/2016  . Monoclonal gammopathy 07/24/2016  . Colon polyps 07/24/2016  . 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 Hx of abnormal cervical Pap smear 07/24/2016  . OA (osteoarthritis) of knee 07/24/2016    Past Medical History:  Diagnosis Date  . DDD (degenerative disc disease), cervical   . Degenerative disc disease, cervical   . Hyperlipidemia   . Multiple thyroid nodules     Family History  Problem Relation Age of Onset  . Hypertension Mother   . Colon polyps Mother   . Hypertension Father    History reviewed. No pertinent surgical history. Social History   Social History Narrative  . No narrative on file     Objective: Vital Signs: BP 125/70   Pulse 70   Resp 12   Ht _1  (1.676 m)   Wt 138 lb (62.6 kg)   BMI 22.27 kg/m    Physical Exam  Constitutional: She is oriented to person, place, and time. She appears well-developed and well-nourished.  HENT:  Head: Normocephalic and atraumatic.  Eyes: Conjunctivae and EOM are normal.  Neck: Normal range of motion.  Cardiovascular: Normal rate, regular rhythm, normal heart sounds and intact distal pulses.   Pulmonary/Chest: Effort normal and breath sounds normal.  Abdominal: Soft. Bowel sounds are normal.  Lymphadenopathy:  She has no cervical adenopathy.  Neurological: She is alert and oriented to person, place, and time.  Skin: Skin is warm and dry. Capillary refill takes less than 2 seconds.  Psychiatric: She has a normal mood and affect. Her behavior is normal.  Nursing note and vitals reviewed.    Musculoskeletal Exam: C-spine some discomfort  range of motion. Lumbar spine discomfort with range of motion. Shoulder joints elbow joints are good range of motion with no synovitis. She has very limited range of motion of her wrist joints. Synovitis was noted and wrist joints MCP joints as described below. She has bilateral knee joint contractures limited extension. There was no synovitis of her MTPs.  CDAI Exam: CDAI Homunculus Exam:   Tenderness:  RUE: wrist LUE: wrist Right hand: 2nd MCP RLE: tibiofemoral LLE: tibiofemoral  Swelling:  RUE: wrist LUE: wrist Right hand: 2nd MCP  Joint Counts:  CDAI Tender Joint count: 5 CDAI Swollen Joint count: 3  Global Assessments:  Patient Global Assessment: 4 Provider Global Assessment: 4  CDAI Calculated Score: 16   Investigation: No additional findings. 10/02/2016 CBC normal, CMP normal, UA negative, hep panel negative, TB gold negative, HIV negative, immunoglobulins normal, IFE positive for IgA Monoclonal Protein, vitamin D 27 ESR 7, CK 46, RF negative, ANA negative Chest x-ray normal Imaging: No results found.  Speciality Comments: No specialty comments available.    Procedures:  Large Joint Inj Date/Time: 11/03/2016 12:20 PM Performed by: Bo Merino Authorized by: Bo Merino   Consent Given by:  Patient Site marked: the procedure site was marked   Timeout: prior to procedure the correct patient, procedure, and site was verified   Indications:  Pain and joint swelling Location:  Knee Site:  R knee Prep: patient was prepped and draped in usual sterile fashion   Needle Size:  27 G Needle Length:  1.5 inches Approach:  Medial Ultrasound Guidance: No   Fluoroscopic Guidance: No   Arthrogram: No   Medications:  1.5 mL lidocaine 1 %; 40 mg triamcinolone acetonide 40 MG/ML Aspiration Attempted: Yes   Aspirate amount (mL):  0 Patient tolerance:  Patient tolerated the procedure well with no immediate complications Large Joint Inj Date/Time: 11/03/2016  12:20 PM Performed by: Bo Merino Authorized by: Bo Merino   Consent Given by:  Patient Site marked: the procedure site was marked   Timeout: prior to procedure the correct patient, procedure, and site was verified   Indications:  Pain and joint swelling Location:  Knee Site:  L knee Prep: patient was prepped and draped in usual sterile fashion   Needle Size:  27 G Needle Length:  1.5 inches Approach:  Medial Ultrasound Guidance: No   Fluoroscopic Guidance: No   Arthrogram: No   Medications:  1.5 mL lidocaine 1 %; 40 mg triamcinolone acetonide 40 MG/ML Aspiration Attempted: Yes   Aspirate amount (mL):  0 Patient tolerance:  Patient tolerated the procedure well with no immediate complications   Allergies: Penicillins; Piroxicam; and Sulfa antibiotics   Assessment / Plan:     Visit Diagnoses: Rheumatoid arthritis of multiple sites with negative rheumatoid factor ([place]) - With multiple contractures. I will check CCP antibody and 14 33 eta with her next labs.. She has severe disease with erosive changes. She still have some synovitis in her hands and wrist. She believes her arthritis is not that severe and she would like to continue with methotrexate without any Biologics. I discussed the option of subcutaneous methotrexate. She was in agreement. We  will proceed with subcutaneous methotrexate. Indications side effects contraindications were discussed. She was given a prescription for methotrexate 0.8 ML subcutaneously every week. Prescription for needles and syringes was given and injection technique was explained in the office today.  High risk medication use - Methotrexate 8 tablets by mouth every week, folic acid 1 mg by mouth daily, prednisone 1 mg by mouth daily . Her labs have been stable.we will continue to monitor her labs every 3 months. She will discontinue oral methotrexate once she started on subcutaneous methotrexate.  Chronic iritis of both eyes - last flare  07/2015. Patient needs to see ophthalmologist.  Vitamin D deficiency - 09/2016 vitamin D 27 -I will call and vitamin D 50,000 units once a week for 3 months and will check level III months later Plan: VITAMIN D 25 Hydroxy (Vit-D Deficiency, Fractures)  Age-related osteoporosis without current pathological fracture: Patient reports that she had DEXA this year at [place]. And she also had Reclast IV in 2018 at [place].I've advised her to get those records for [place].  Rheumatoid arthritis involving both hands : She is ongoing synovitis.  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 She was having severe pain and discomfort and has contractures. She states she used to get cortisone injections in the past per request bilateral knee joints were prepped in sterile fashion injected with cortisone as described above.  Rheumatoid arthritis involving both feet - With erosive changes  Monoclonal gammopathy - Patient has appointment scheduled for 11/09/2016.  Mixed hyperlipidemia  Multiple thyroid nodules    Orders: Orders Placed This Encounter  Procedures  . Large Joint Injection/Arthrocentesis  . Large Joint Injection/Arthrocentesis  . VITAMIN D 25 Hydroxy (Vit-D Deficiency, Fractures)  . 14-3-3 eta Protein  . Cyclic citrul peptide antibody, IgG   Meds ordered this encounter  Medications  . methotrexate 50 MG/2ML injection    Sig: Inject 0.8 mLs (20 mg total) into the skin once a week.    Dispense:  10 mL    Refill:  0    Please dispense 2 ml vials w/preservatives.  . Tuberculin-Allergy Syringes 27G X 1/2" 1 ML MISC    Sig: Patient to inject methotrexate once weekly.    Dispense:  12 each    Refill:  3  . Vitamin D, Ergocalciferol, (DRISDOL) 50000 units CAPS capsule    Sig: Take 1 capsule (50,000 Units total) by mouth every 7 (seven) days.    Dispense:  12 capsule    Refill:  0    Face-to-face time spent with patient was 30 minutes.  Greater than50% of time was spent in counseling and coordination of care.  Follow-Up Instructions: Return in about 4 months (around 03/05/2017) for Rheumatoid arthritis, iritis.   Bo Merino, MD  Note - This record has been created using Editor, commissioning.  Chart creation errors have been sought, but may not always  have been located. Such creation errors do not reflect on  the standard of medical care.

## 2016-11-03 ENCOUNTER — Ambulatory Visit (INDEPENDENT_AMBULATORY_CARE_PROVIDER_SITE_OTHER): Payer: BLUE CROSS/BLUE SHIELD | Admitting: Rheumatology

## 2016-11-03 ENCOUNTER — Telehealth: Payer: Self-pay

## 2016-11-03 ENCOUNTER — Encounter: Payer: Self-pay | Admitting: Rheumatology

## 2016-11-03 VITALS — BP 125/70 | HR 70 | Resp 12 | Ht 66.0 in | Wt 138.0 lb

## 2016-11-03 DIAGNOSIS — M06042 Rheumatoid arthritis without rheumatoid factor, left hand: Secondary | ICD-10-CM | POA: Diagnosis not present

## 2016-11-03 DIAGNOSIS — M170 Bilateral primary osteoarthritis of knee: Secondary | ICD-10-CM | POA: Diagnosis not present

## 2016-11-03 DIAGNOSIS — D472 Monoclonal gammopathy: Secondary | ICD-10-CM | POA: Diagnosis not present

## 2016-11-03 DIAGNOSIS — M06071 Rheumatoid arthritis without rheumatoid factor, right ankle and foot: Secondary | ICD-10-CM | POA: Diagnosis not present

## 2016-11-03 DIAGNOSIS — E782 Mixed hyperlipidemia: Secondary | ICD-10-CM

## 2016-11-03 DIAGNOSIS — H2013 Chronic iridocyclitis, bilateral: Secondary | ICD-10-CM

## 2016-11-03 DIAGNOSIS — M810 Age-related osteoporosis without current pathological fracture: Secondary | ICD-10-CM | POA: Diagnosis not present

## 2016-11-03 DIAGNOSIS — M06072 Rheumatoid arthritis without rheumatoid factor, left ankle and foot: Secondary | ICD-10-CM

## 2016-11-03 DIAGNOSIS — E559 Vitamin D deficiency, unspecified: Secondary | ICD-10-CM

## 2016-11-03 DIAGNOSIS — E042 Nontoxic multinodular goiter: Secondary | ICD-10-CM | POA: Diagnosis not present

## 2016-11-03 DIAGNOSIS — Z79899 Other long term (current) drug therapy: Secondary | ICD-10-CM | POA: Diagnosis not present

## 2016-11-03 DIAGNOSIS — M0609 Rheumatoid arthritis without rheumatoid factor, multiple sites: Secondary | ICD-10-CM

## 2016-11-03 DIAGNOSIS — M06041 Rheumatoid arthritis without rheumatoid factor, right hand: Secondary | ICD-10-CM | POA: Diagnosis not present

## 2016-11-03 MED ORDER — "TUBERCULIN-ALLERGY SYRINGES 27G X 1/2"" 1 ML MISC": 3 refills | Status: DC

## 2016-11-03 MED ORDER — VITAMIN D (ERGOCALCIFEROL) 1.25 MG (50000 UNIT) PO CAPS: 50000.0000 [IU] | ORAL_CAPSULE | ORAL | 0 refills | Status: DC

## 2016-11-03 MED ORDER — LIDOCAINE HCL 1 % IJ SOLN
1.5000 mL | INTRAMUSCULAR | Status: AC | PRN
  Administered 2016-11-03: 1.5 mL

## 2016-11-03 MED ORDER — TRIAMCINOLONE ACETONIDE 40 MG/ML IJ SUSP
40.0000 mg | INTRAMUSCULAR | Status: AC | PRN
  Administered 2016-11-03: 40 mg via INTRA_ARTICULAR

## 2016-11-03 MED ORDER — METHOTREXATE SODIUM CHEMO INJECTION 50 MG/2ML: 20.0000 mg | INTRAMUSCULAR | 0 refills | Status: DC

## 2016-11-03 NOTE — Patient Instructions (Addendum)
Please review following information on Humira. If methotrexate is not adequate to control her symptoms we may consider this medication in future. Adalimumab Injection What is this medicine? ADALIMUMAB (a dal AYE mu mab) is used to treat rheumatoid and psoriatic arthritis. It is also used to treat ankylosing spondylitis, Crohn's disease, ulcerative colitis, plaque psoriasis, hidradenitis suppurativa, and uveitis. This medicine may be used for other purposes; ask your health care provider or pharmacist if you have questions. COMMON BRAND NAME(S): CYLTEZO, Humira What should I tell my health care provider before I take this medicine? They need to know if you have any of these conditions: -diabetes -heart disease -hepatitis B or history of hepatitis B infection -immune system problems -infection or history of infections -multiple sclerosis -recently received or scheduled to receive a vaccine -scheduled to have surgery -tuberculosis, a positive skin test for tuberculosis or have recently been in close contact with someone who has tuberculosis -an unusual reaction to adalimumab, other medicines, mannitol, latex, rubber, foods, dyes, or preservatives -pregnant or trying to get pregnant -breast-feeding How should I use this medicine? This medicine is for injection under the skin. You will be taught how to prepare and give this medicine. Use exactly as directed. Take your medicine at regular intervals. Do not take your medicine more often than directed. A special MedGuide will be given to you by the pharmacist with each prescription and refill. Be sure to read this information carefully each time. It is important that you put your used needles and syringes in a special sharps container. Do not put them in a trash can. If you do not have a sharps container, call your pharmacist or healthcare provider to get one. Talk to your pediatrician regarding the use of this medicine in children. While this drug  may be prescribed for children as young as 2 years for selected conditions, precautions do apply. The manufacturer of the medicine offers free information to patients and their health care partners. Call 850-750-1995 for more information. Overdosage: If you think you have taken too much of this medicine contact a poison control center or emergency room at once. NOTE: This medicine is only for you. Do not share this medicine with others. What if I miss a dose? If you miss a dose, take it as soon as you can. If it is almost time for your next dose, take only that dose. Do not take double or extra doses. Give the next dose when your next scheduled dose is due. Call your doctor or health care professional if you are not sure how to handle a missed dose. What may interact with this medicine? Do not take this medicine with any of the following medications: -abatacept -anakinra -etanercept -infliximab -live virus vaccines -rilonacept This medicine may also interact with the following medications: -vaccines This list may not describe all possible interactions. Give your health care provider a list of all the medicines, herbs, non-prescription drugs, or dietary supplements you use. Also tell them if you smoke, drink alcohol, or use illegal drugs. Some items may interact with your medicine. What should I watch for while using this medicine? Visit your doctor or health care professional for regular checks on your progress. Tell your doctor or healthcare professional if your symptoms do not start to get better or if they get worse. You will be tested for tuberculosis (TB) before you start this medicine. If your doctor prescribes any medicine for TB, you should start taking the TB medicine before starting this medicine.  Make sure to finish the full course of TB medicine. Call your doctor or health care professional if you get a cold or other infection while receiving this medicine. Do not treat yourself.  This medicine may decrease your body's ability to fight infection. Talk to your doctor about your risk of cancer. You may be more at risk for certain types of cancers if you take this medicine. What side effects may I notice from receiving this medicine? Side effects that you should report to your doctor or health care professional as soon as possible: -allergic reactions like skin rash, itching or hives, swelling of the face, lips, or tongue -breathing problems -changes in vision -chest pain -fever, chills, or any other sign of infection -numbness or tingling -red, scaly patches or raised bumps on the skin -swelling of the ankles -swollen lymph nodes in the neck, underarm, or groin areas -unexplained weight loss -unusual bleeding or bruising -unusually weak or tired Side effects that usually do not require medical attention (report to your doctor or health care professional if they continue or are bothersome): -headache -nausea -redness, itching, swelling, or bruising at site where injected This list may not describe all possible side effects. Call your doctor for medical advice about side effects. You may report side effects to FDA at 1-800-FDA-1088. Where should I keep my medicine? Keep out of the reach of children. Store in the original container and in the refrigerator between 2 and 8 degrees C (36 and 46 degrees F). Do not freeze. The product may be stored in a cool carrier with an ice pack, if needed. Protect from light. Throw away any unused medicine after the expiration date. NOTE: This sheet is a summary. It may not cover all possible information. If you have questions about this medicine, talk to your doctor, pharmacist, or health care provider.  2018 Elsevier/Gold Standard (2014-08-15 11:11:43)  Please discontinue oral methotrexate when you start subcutaneous methotrexate.  Please schedule appointment with the ophthalmologist to follow-up on iritis.  Standing Labs We  placed an order today for your standing lab work.    Please come back and get your standing labs in November, 2018 then every 3 months. CCP antibody and 14 33 eta antibody with the next lab. Vitamin D level in 3 months  We have open lab Monday through Friday from 8:30-11:30 AM and 1:30-4 PM at the office of Dr. Bo Merino.   The office is located at [address] [place], [place], [place] [address] No appointment is necessary.   Labs are drawn by [facility].  You may receive a bill from [facility] for your lab work. If you have any questions regarding directions or hours of operation,  please call [phone].

## 2016-11-03 NOTE — Telephone Encounter (Signed)
A prior authorization for Humira has been submitted to patients insurance via cover my meds. Will update once we receive a response.   Blue Winther, [place], CPhT 12:47 PM

## 2016-11-05 NOTE — Telephone Encounter (Addendum)
Received a confirmation from Cover My Meds regarding a prior authorization approval for HUMIRA from 11/03/2016 to 02/08/2038.   Reference number:none  Phone number:none  Will send document to scan center once received.  Patient does not want to start a biologic at this time. Authorization is on file for future use if needed.   Abi Shoults, [place], CPhT 8:20 AM

## 2016-11-09 ENCOUNTER — Telehealth: Payer: Self-pay | Admitting: Hematology and Oncology

## 2016-11-09 ENCOUNTER — Encounter: Payer: BLUE CROSS/BLUE SHIELD | Admitting: Hematology and Oncology

## 2016-11-09 NOTE — Telephone Encounter (Signed)
MP out sick. Spoke with patient re cancelling appointment for today and rescheduling for 10/4 @ 11 am. Patient has new date/time.

## 2016-11-12 ENCOUNTER — Encounter: Payer: Self-pay | Admitting: Hematology and Oncology

## 2016-11-12 ENCOUNTER — Ambulatory Visit (HOSPITAL_BASED_OUTPATIENT_CLINIC_OR_DEPARTMENT_OTHER): Payer: BLUE CROSS/BLUE SHIELD

## 2016-11-12 ENCOUNTER — Telehealth: Payer: Self-pay | Admitting: Hematology and Oncology

## 2016-11-12 ENCOUNTER — Ambulatory Visit (HOSPITAL_BASED_OUTPATIENT_CLINIC_OR_DEPARTMENT_OTHER): Payer: BLUE CROSS/BLUE SHIELD | Admitting: Hematology and Oncology

## 2016-11-12 VITALS — BP 124/67 | HR 68 | Temp 98.5°F | Resp 18 | Ht 66.0 in | Wt 139.9 lb

## 2016-11-12 DIAGNOSIS — M542 Cervicalgia: Secondary | ICD-10-CM

## 2016-11-12 DIAGNOSIS — D472 Monoclonal gammopathy: Secondary | ICD-10-CM

## 2016-11-12 DIAGNOSIS — M0600 Rheumatoid arthritis without rheumatoid factor, unspecified site: Secondary | ICD-10-CM

## 2016-11-12 DIAGNOSIS — Z87891 Personal history of nicotine dependence: Secondary | ICD-10-CM

## 2016-11-12 LAB — CBC WITH DIFFERENTIAL/PLATELET
BASO%: 0.3 % (ref 0.0–2.0)
Basophils Absolute: 0 10*3/uL (ref 0.0–0.1)
EOS%: 0.9 % (ref 0.0–7.0)
Eosinophils Absolute: 0.1 10*3/uL (ref 0.0–0.5)
HCT: 40.5 % (ref 34.8–46.6)
HGB: 13.1 g/dL (ref 11.6–15.9)
LYMPH%: 23.9 % (ref 14.0–49.7)
MCH: 32.6 pg (ref 25.1–34.0)
MCHC: 32.3 g/dL (ref 31.5–36.0)
MCV: 100.7 fL (ref 79.5–101.0)
MONO#: 0.5 10*3/uL (ref 0.1–0.9)
MONO%: 7.7 % (ref 0.0–14.0)
NEUT%: 67.2 % (ref 38.4–76.8)
NEUTROS ABS: 4.6 10*3/uL (ref 1.5–6.5)
PLATELETS: 260 10*3/uL (ref 145–400)
RBC: 4.02 10*6/uL (ref 3.70–5.45)
RDW: 15.7 % — ABNORMAL HIGH (ref 11.2–14.5)
WBC: 6.9 10*3/uL (ref 3.9–10.3)
lymph#: 1.6 10*3/uL (ref 0.9–3.3)

## 2016-11-12 LAB — COMPREHENSIVE METABOLIC PANEL
ALT: 12 U/L (ref 0–55)
ANION GAP: 8 meq/L (ref 3–11)
AST: 18 U/L (ref 5–34)
Albumin: 3.7 g/dL (ref 3.5–5.0)
Alkaline Phosphatase: 61 U/L (ref 40–150)
BILIRUBIN TOTAL: 0.3 mg/dL (ref 0.20–1.20)
BUN: 12 mg/dL (ref 7.0–26.0)
CHLORIDE: 106 meq/L (ref 98–109)
CO2: 27 meq/L (ref 22–29)
CREATININE: 0.9 mg/dL (ref 0.6–1.1)
Calcium: 9.6 mg/dL (ref 8.4–10.4)
EGFR: 79 mL/min/{1.73_m2} — ABNORMAL LOW (ref >90)
GLUCOSE: 87 mg/dL (ref 70–140)
Potassium: 3.8 mEq/L (ref 3.5–5.1)
SODIUM: 141 meq/L (ref 136–145)
TOTAL PROTEIN: 7 g/dL (ref 6.4–8.3)

## 2016-11-12 LAB — LACTATE DEHYDROGENASE: LDH: 187 U/L (ref 125–245)

## 2016-11-12 LAB — URIC ACID: URIC ACID, SERUM: 3.6 mg/dL (ref 2.6–7.4)

## 2016-11-12 NOTE — Telephone Encounter (Signed)
Scheduled appt per 10/4 los - Gave patient AVS and calender per los.  

## 2016-11-13 LAB — KAPPA/LAMBDA LIGHT CHAINS
Ig Kappa Free Light Chain: 10.1 mg/L (ref 3.3–19.4)
Ig Lambda Free Light Chain: 9.6 mg/L (ref 5.7–26.3)
Kappa/Lambda FluidC Ratio: 1.05 (ref 0.26–1.65)

## 2016-11-16 ENCOUNTER — Telehealth: Payer: Self-pay

## 2016-11-16 NOTE — Assessment & Plan Note (Signed)
61 y.o. 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 The monoclonal protein may represent antibodies associated with arthritis or may represent development of a plasma cell disorder developing in the context of autoimmune condition.  We will repeat workup including complete protein panels including a urinalysis. We'll obtain imaging to assess for lytic lesions in the skeletal structures. The present time, patient does not meet any CRAB criteria for active multiple myeloma to be suspected.  Plan: --Labs today as outlined below --Skeletal survey

## 2016-11-16 NOTE — Telephone Encounter (Signed)
Pt called that she is due to collect 24 hour urine. She was asking if she has to wait or can collect it now. Explained she can collect it now as it fits her time schedule better. Instructed she needs to bring it to lab on the day she finishes the collection. She states she has her directions on how to collect.

## 2016-11-16 NOTE — Progress Notes (Signed)
[facility] New Visit:  Assessment: Monoclonal gammopathy 61 y.o. 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 The monoclonal protein may represent antibodies associated with arthritis or may represent development of a plasma cell disorder developing in the context of autoimmune condition.  We will repeat workup including complete protein panels including a urinalysis. We'll obtain imaging to assess for lytic lesions in the skeletal structures. The present time, patient does not meet any CRAB criteria for active multiple myeloma to be suspected.  Plan: --Labs today as outlined below --Skeletal survey  Return to clinic in 1 week to review the findings  Voice recognition software was used and creation of this note. Despite my best effort at editing the text, some misspelling/errors may have occurred.  Orders Placed This Encounter  Procedures  . DG Bone Survey Met    Standing Status:   Future    Standing Expiration Date:   01/12/2018    Order Specific Question:   Reason for Exam (SYMPTOM  OR DIAGNOSIS REQUIRED)    Answer:   MGUS, please eval for lytic skeletal lesion of possible Multiple myeloma    Order Specific Question:   Preferred imaging location?    Answer:   [facility]    Order Specific Question:   Radiology Contrast Protocol - do NOT remove file path    Answer:   [email or web address]  . CBC with Differential    Standing Status:   Future    Number of Occurrences:   1    Standing Expiration Date:   11/12/2017  . Comprehensive metabolic panel    Standing Status:   Future    Number of Occurrences:   1    Standing Expiration Date:   11/12/2017  . Lactate dehydrogenase (LDH)    Standing Status:   Future    Number of Occurrences:   1    Standing Expiration Date:   11/12/2017  . Uric acid    Standing Status:   Future    Number of  Occurrences:   1    Standing Expiration Date:   11/12/2017  . Multiple Myeloma Panel (SPEP&IFE w/QIG)    Standing Status:   Future    Number of Occurrences:   1    Standing Expiration Date:   11/12/2017  . Kappa/lambda light chains    Standing Status:   Future    Number of Occurrences:   1    Standing Expiration Date:   11/12/2017  . 24-Hr Ur UPEP/UIFE/Light Chains/TP    Standing Status:   Future    Number of Occurrences:   1    Standing Expiration Date:   11/12/2017    All questions were answered.  . The patient knows to call the clinic with any problems, questions or concerns.  This note was electronically signed.    History of Presenting Illness Vickie Pearson 61 y.o. presenting to the [place] for Evaluation of monoclonal gammopathy of unknown significance. Patient's current medical history is dominated by significantly uncomfortable seronegative rheumatoid arthritis. Patient is currently receiving methotrexate. In the past, hematuria initiation was considered, but patient refused that. The present time. Patient denies any fevers, chills, night sweats. Patient reports feeling reasonably well. She does complain off some pain in the left neck with some radiation down into the left upper extremity. Currently, denies having significant joint pain. In the past, did have cortisone injections in the knees was significant relief.  Denies any respiratory, gastrointestinal, into urinary, or dermatological complaints.  Oncological/hematological History: --Labs, 10/02/16: tProt 6.5, Alb 3.9, Ca 9.4, Cr 0.7, AP 53; SIFE -- IgA kappa monoclonal protein present; IgG 1167, IgA 233, IgM 39; WBC 4.2, Nl diff, Hgb 12.6, Plt 314; ESR 7, ANA & RF -- negative  Medical History: Past Medical History:  Diagnosis Date  . Colon polyps   . Costochondritis   . DDD (degenerative disc disease), cervical   . Degenerative arthritis of knee    Bilateral  . Degenerative disc disease, cervical   .  Degenerative disc disease, cervical   . Fibromyalgia   . Hyperlipidemia   . Monoclonal gammopathy   . Multiple thyroid nodules   . Rheumatoid arthritis [facility])     Surgical History: History reviewed. No pertinent surgical history.  Family History: Family History  Problem Relation Age of Onset  . Hypertension Mother   . Colon polyps Mother   . Stroke Mother   . Arthritis Mother   . Hypertension Father   . Heart disease Father   . Arthritis Father   . Kidney disease Father   . Hypertension Sister   . Hyperlipidemia Sister   . Hypertension Brother   . Hyperlipidemia Brother     Social History: Social History   Social History  . Marital status: Widowed    Spouse name: N/A  . Number of children: N/A  . Years of education: N/A   Occupational History  . Not on file.   Social History Main Topics  . Smoking status: Former Smoker    Packs/day: 1.00    Types: Cigarettes    Quit date: 07/24/1988  . Smokeless tobacco: Never Used  . Alcohol use Yes     Comment: occasionally  . Drug use: No  . Sexual activity: Not on file   Other Topics Concern  . Not on file   Social History Narrative  . No narrative on file    Allergies: Allergies  Allergen Reactions  . Penicillins Hives  . Piroxicam Hives  . Sulfa Antibiotics Hives    Medications:  Current Outpatient Prescriptions  Medication Sig Dispense Refill  . folic acid (FOLVITE) 1 MG tablet Take 1 tablet (1 mg total) by mouth daily. (Patient taking differently: Take 2 mg by mouth daily. ) 100 tablet 2  . gabapentin (NEURONTIN) 300 MG capsule Take 300 mg by mouth as needed.    . methotrexate 50 MG/2ML injection Inject 0.8 mLs (20 mg total) into the skin once a week. 10 mL 0  . pravastatin (PRAVACHOL) 20 MG tablet Take one tab every other day. 60 tablet 3  . predniSONE (DELTASONE) 1 MG tablet Take 4 tablets (4 mg total) by mouth daily with breakfast. (Patient taking differently: Take 1 mg by mouth daily with breakfast.  Tapering to 18m per day) 120 tablet 2  . Tuberculin-Allergy Syringes 27G X 1/2" 1 ML MISC Patient to inject methotrexate once weekly. 12 each 3  . Vitamin D, Ergocalciferol, (DRISDOL) 50000 units CAPS capsule Take 1 capsule (50,000 Units total) by mouth every 7 (seven) days. 12 capsule 0   No current facility-administered medications for this visit.     Review of Systems: Review of Systems  Constitutional: Negative for diaphoresis and fever.  Musculoskeletal: Positive for neck pain.  All other systems reviewed and are negative.    PHYSICAL EXAMINATION Blood pressure 124/67, pulse 68, temperature 98.5 F (36.9 C), temperature source Oral, resp. rate 18, height _0  (1.676 m), weight  139 lb 14.4 oz (63.5 kg), SpO2 100 %.  ECOG PERFORMANCE STATUS: 2 - Symptomatic, <50% confined to bed  Physical Exam  Constitutional: She is oriented to person, place, and time and well-developed, well-nourished, and in no distress.  HENT:  Head: Normocephalic and atraumatic.  Mouth/Throat: Oropharynx is clear and moist. No oropharyngeal exudate.  Eyes: Pupils are equal, round, and reactive to light. Conjunctivae are normal.  Neck: No thyromegaly present.  Cardiovascular: Normal rate, regular rhythm, normal heart sounds and intact distal pulses.   No murmur heard. Pulmonary/Chest: Effort normal and breath sounds normal. No respiratory distress. She has no wheezes.  Abdominal: Soft. Bowel sounds are normal. She exhibits no distension and no mass. There is no tenderness. There is no rebound and no guarding.  Musculoskeletal: Normal range of motion. She exhibits no edema or tenderness.  Lymphadenopathy:    She has no cervical adenopathy.  Neurological: She is alert and oriented to person, place, and time. She has normal reflexes. No cranial nerve deficit.  Skin: Skin is warm and dry. No rash noted. She is not diaphoretic. No erythema.     LABORATORY DATA: I have personally reviewed the data as  listed: Appointment on 11/12/2016  Component Date Value Ref Range Status  . WBC 11/12/2016 6.9  3.9 - 10.3 10e3/uL Final  . NEUT# 11/12/2016 4.6  1.5 - 6.5 10e3/uL Final  . HGB 11/12/2016 13.1  11.6 - 15.9 g/dL Final  . HCT 11/12/2016 40.5  34.8 - 46.6 % Final  . Platelets 11/12/2016 260  145 - 400 10e3/uL Final  . MCV 11/12/2016 100.7  79.5 - 101.0 fL Final  . MCH 11/12/2016 32.6  25.1 - 34.0 pg Final  . MCHC 11/12/2016 32.3  31.5 - 36.0 g/dL Final  . RBC 11/12/2016 4.02  3.70 - 5.45 10e6/uL Final  . RDW 11/12/2016 15.7* 11.2 - 14.5 % Final  . lymph# 11/12/2016 1.6  0.9 - 3.3 10e3/uL Final  . MONO# 11/12/2016 0.5  0.1 - 0.9 10e3/uL Final  . Eosinophils Absolute 11/12/2016 0.1  0.0 - 0.5 10e3/uL Final  . Basophils Absolute 11/12/2016 0.0  0.0 - 0.1 10e3/uL Final  . NEUT% 11/12/2016 67.2  38.4 - 76.8 % Final  . LYMPH% 11/12/2016 23.9  14.0 - 49.7 % Final  . MONO% 11/12/2016 7.7  0.0 - 14.0 % Final  . EOS% 11/12/2016 0.9  0.0 - 7.0 % Final  . BASO% 11/12/2016 0.3  0.0 - 2.0 % Final  . Sodium 11/12/2016 141  136 - 145 mEq/L Final  . Potassium 11/12/2016 3.8  3.5 - 5.1 mEq/L Final  . Chloride 11/12/2016 106  98 - 109 mEq/L Final  . CO2 11/12/2016 27  22 - 29 mEq/L Final  . Glucose 11/12/2016 87  70 - 140 mg/dl Final   Glucose reference range is for nonfasting patients. Fasting glucose reference range is 70- 100.  Marland Kitchen BUN 11/12/2016 12.0  7.0 - 26.0 mg/dL Final  . Creatinine 11/12/2016 0.9  0.6 - 1.1 mg/dL Final  . Total Bilirubin 11/12/2016 0.30  0.20 - 1.20 mg/dL Final  . Alkaline Phosphatase 11/12/2016 61  40 - 150 U/L Final  . AST 11/12/2016 18  5 - 34 U/L Final  . ALT 11/12/2016 12  0 - 55 U/L Final  . Total Protein 11/12/2016 7.0  6.4 - 8.3 g/dL Final  . Albumin 11/12/2016 3.7  3.5 - 5.0 g/dL Final  . Calcium 11/12/2016 9.6  8.4 - 10.4 mg/dL Final  .  Anion Gap 11/12/2016 8  3 - 11 mEq/L Final  . EGFR 11/12/2016 79* >90 ml/min/1.73 m2 Final   eGFR is calculated using the CKD-EPI  Creatinine Equation (2009)  . LDH 11/12/2016 187  125 - 245 U/L Final  . Uric Acid, Serum 11/12/2016 3.6  2.6 - 7.4 mg/dl Final  . Ig Kappa Free Light Chain 11/12/2016 10.1  3.3 - 19.4 mg/L Final  . Ig Lambda Free Light Chain 11/12/2016 9.6  5.7 - 26.3 mg/L Final  . Kappa/Lambda FluidC Ratio 11/12/2016 1.05  0.26 - 1.65 Final         Ardath Sax, MD

## 2016-11-17 LAB — MULTIPLE MYELOMA PANEL, SERUM
ALBUMIN SERPL ELPH-MCNC: 3.3 g/dL (ref 2.9–4.4)
ALPHA 1: 0.2 g/dL (ref 0.0–0.4)
Albumin/Glob SerPl: 1.2 (ref 0.7–1.7)
Alpha2 Glob SerPl Elph-Mcnc: 0.7 g/dL (ref 0.4–1.0)
B-Globulin SerPl Elph-Mcnc: 1 g/dL (ref 0.7–1.3)
GAMMA GLOB SERPL ELPH-MCNC: 1.1 g/dL (ref 0.4–1.8)
GLOBULIN, TOTAL: 3 g/dL (ref 2.2–3.9)
IGA/IMMUNOGLOBULIN A, SERUM: 218 mg/dL (ref 87–352)
IgG, Qn, Serum: 1055 mg/dL (ref 700–1600)
IgM, Qn, Serum: 41 mg/dL (ref 26–217)
M Protein SerPl Elph-Mcnc: 0.2 g/dL — ABNORMAL HIGH
Total Protein: 6.3 g/dL (ref 6.0–8.5)

## 2016-11-18 ENCOUNTER — Ambulatory Visit (HOSPITAL_BASED_OUTPATIENT_CLINIC_OR_DEPARTMENT_OTHER): Payer: BLUE CROSS/BLUE SHIELD | Admitting: Hematology and Oncology

## 2016-11-18 ENCOUNTER — Encounter: Payer: Self-pay | Admitting: Hematology and Oncology

## 2016-11-18 ENCOUNTER — Telehealth: Payer: Self-pay | Admitting: Hematology and Oncology

## 2016-11-18 VITALS — BP 112/70 | Temp 98.7°F | Resp 18 | Ht 66.0 in | Wt 138.7 lb

## 2016-11-18 DIAGNOSIS — M0600 Rheumatoid arthritis without rheumatoid factor, unspecified site: Secondary | ICD-10-CM

## 2016-11-18 DIAGNOSIS — D472 Monoclonal gammopathy: Secondary | ICD-10-CM | POA: Diagnosis not present

## 2016-11-18 DIAGNOSIS — M542 Cervicalgia: Secondary | ICD-10-CM | POA: Diagnosis not present

## 2016-11-18 LAB — UPEP/UIFE/LIGHT CHAINS/TP, 24-HR UR
% BETA, Urine: 0 %
ALBUMIN, U: 100 %
ALPHA 1 URINE: 0 %
ALPHA-2-GLOBULIN, U: 0 %
FREE LAMBDA LT CHAINS, UR: 0.44 mg/L (ref 0.24–6.66)
Free Kappa Lt Chains,Ur: 8.86 mg/L (ref 1.35–24.19)
GAMMA GLOBULIN URINE: 0 %
KAPPA/LAMBDA RATIO, U: 20.14 — AB (ref 2.04–10.37)
PROTEIN 24H UR: 83 mg/(24.h) (ref 30–150)
PROTEIN,TOTAL,URINE: 6.6 mg/dL

## 2016-11-18 NOTE — Telephone Encounter (Signed)
Gave avs and calendar for January 2019 °

## 2016-11-24 ENCOUNTER — Ambulatory Visit (HOSPITAL_COMMUNITY)
Admission: RE | Admit: 2016-11-24 | Discharge: 2016-11-24 | Disposition: A | Discharge status: Home / Self Care | Payer: BLUE CROSS/BLUE SHIELD | Source: Ambulatory Visit | Attending: Hematology and Oncology | Admitting: Hematology and Oncology

## 2016-11-24 ENCOUNTER — Encounter: Payer: Self-pay | Admitting: Hematology and Oncology

## 2016-11-24 DIAGNOSIS — M47816 Spondylosis without myelopathy or radiculopathy, lumbar region: Secondary | ICD-10-CM | POA: Diagnosis not present

## 2016-11-24 DIAGNOSIS — M47812 Spondylosis without myelopathy or radiculopathy, cervical region: Secondary | ICD-10-CM | POA: Diagnosis not present

## 2016-11-24 DIAGNOSIS — D472 Monoclonal gammopathy: Secondary | ICD-10-CM | POA: Insufficient documentation

## 2016-11-24 NOTE — Assessment & Plan Note (Signed)
60 y.o. 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 The monoclonal protein may represent antibodies associated with arthritis or may represent development of a plasma cell disorder developing in the context of autoimmune condition.   Repeat labwork demonstrates persistent over the monoclonal IgA kappa with preserved kappa to lambda ratio (KLR). No significant hypercalcemia, no renal insufficiency, and no anemia or thrombocytopenia. Skeletal survey obtained following the visit demonstrates no evidence of lytic lesions in the skeletal structures. 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 As we will not likely treat the disease at this time, bone marrow biopsy can be postponed. Patient does need to be surveyed as I'll gammopathy that we're observing presently may transition into active multiple myeloma in the future.  Plan: --Start observation/monitoring --RTC 3 months with labs as outlined below. On biochemical progression, we will repeat imaging and may consider obtaining bone marrow biopsy 

## 2016-11-24 NOTE — Progress Notes (Signed)
[facility] Follow-up Visit:  Assessment: Monoclonal gammopathy 61 y.o. 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 The monoclonal protein may represent antibodies associated with arthritis or may represent development of a plasma cell disorder developing in the context of autoimmune condition.   Repeat labwork demonstrates persistent over the monoclonal IgA kappa with preserved kappa to lambda ratio (KLR). No significant hypercalcemia, no renal insufficiency, and no anemia or thrombocytopenia. Skeletal survey obtained following the visit demonstrates no evidence of lytic lesions in the skeletal structures. 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 As we will not likely treat the disease at this time, bone marrow biopsy can be postponed. Patient does need to be surveyed as I'll gammopathy that we're observing presently may transition into active multiple myeloma in the future.  Plan: --Start observation/monitoring --RTC 3 months with labs as outlined below. On biochemical progression, we will repeat imaging and may consider obtaining bone marrow biopsy  Voice recognition software was used and creation of this note. Despite my best effort at editing the text, some misspelling/errors may have occurred.  Orders Placed This Encounter  Procedures  . CBC with Differential    Standing Status:   Future    Standing Expiration Date:   11/18/2017  . Comprehensive metabolic panel    Standing Status:   Future    Standing Expiration Date:   11/18/2017  . Lactate dehydrogenase (LDH)    Standing Status:   Future    Standing Expiration Date:   11/18/2017  . Multiple Myeloma Panel (SPEP&IFE w/QIG)    Standing Status:   Future    Standing Expiration Date:   11/18/2017  . Kappa/lambda light chains    Standing  Status:   Future    Standing Expiration Date:   11/18/2017    Cancer Staging No matching staging information was found for the patient.  All questions were answered.  . The patient knows to call the clinic with any problems, questions or concerns.  This note was electronically signed.    History of Presenting Illness Henchy Mccauley is a 61 y.o. female followed in the [facility] for diagnosis of monoclonal gammopathy of unknown significance (MGUS). Patient's current medical history is dominated by significantly uncomfortable seronegative rheumatoid arthritis. Patient is currently receiving methotrexate. In the past, hematuria initiation was considered, but patient refused that. The present time. Patient denies any fevers, chills, night sweats. Patient reports feeling reasonably well. She does complain off some pain in the left neck with some radiation down into the left upper extremity. Currently, denies having significant joint pain. In the past, did have cortisone injections in the knees was significant relief. Denies any respiratory, gastrointestinal, into urinary, or dermatological complaints.  Patient returns to the clinic to review findings of our lab workup. Patient denies any new complaints since last visit to the clinic.  Oncological/hematological History: --Labs, 10/02/16: tProt 6.5, Alb 3.9, Ca 9.4, Cr 0.7, AP 53; SPEP/SIFE -- IgA kappa monoclonal protein present; IgG 1167, IgA 233, IgM 39;                                                         WBC 4.2, Hgb 12.6, Plt  314; ESR 7, ANA & RF -- negative --Labs, 11/12/16: tProt 7.0, Alb 3.7, Ca 9.6, Cr 0.9, AP 61; SPEP/SIFE -- M-spike 0.2g/dL, IgA kappa;                                                             kappa 10.1, lambda 9.6, KLR 1.05; WBC 6.9, Hgb 13.1, Plt 260;  --Skeletal Survey, 11/24/16: no evidence of skeletal lytic lesions.  Medical History: Past Medical History:  Diagnosis Date  . Colon polyps   .  Costochondritis   . DDD (degenerative disc disease), cervical   . Degenerative arthritis of knee    Bilateral  . Degenerative disc disease, cervical   . Degenerative disc disease, cervical   . Fibromyalgia   . Hyperlipidemia   . Monoclonal gammopathy   . Multiple thyroid nodules   . Rheumatoid arthritis [facility])     Surgical History: No past surgical history on file.  Family History: Family History  Problem Relation Age of Onset  . Hypertension Mother   . Colon polyps Mother   . Stroke Mother   . Arthritis Mother   . Hypertension Father   . Heart disease Father   . Arthritis Father   . Kidney disease Father   . Hypertension Sister   . Hyperlipidemia Sister   . Hypertension Brother   . Hyperlipidemia Brother     Social History: Social History   Social History  . Marital status: Widowed    Spouse name: N/A  . Number of children: N/A  . Years of education: N/A   Occupational History  . Not on file.   Social History Main Topics  . Smoking status: Former Smoker    Packs/day: 1.00    Types: Cigarettes    Quit date: 07/24/1988  . Smokeless tobacco: Never Used  . Alcohol use Yes     Comment: occasionally  . Drug use: No  . Sexual activity: Not on file   Other Topics Concern  . Not on file   Social History Narrative  . No narrative on file    Allergies: Allergies  Allergen Reactions  . Penicillins Hives  . Piroxicam Hives  . Sulfa Antibiotics Hives    Medications:  Current Outpatient Prescriptions  Medication Sig Dispense Refill  . folic acid (FOLVITE) 1 MG tablet Take 1 tablet (1 mg total) by mouth daily. (Patient taking differently: Take 2 mg by mouth daily. ) 100 tablet 2  . gabapentin (NEURONTIN) 300 MG capsule Take 300 mg by mouth as needed.    . methotrexate 50 MG/2ML injection Inject 0.8 mLs (20 mg total) into the skin once a week. 10 mL 0  . pravastatin (PRAVACHOL) 20 MG tablet Take one tab every other day. 60 tablet 3  . predniSONE (DELTASONE)  1 MG tablet Take 4 tablets (4 mg total) by mouth daily with breakfast. (Patient taking differently: Take 1 mg by mouth daily with breakfast. Tapering to 23m per day) 120 tablet 2  . Vitamin D, Ergocalciferol, (DRISDOL) 50000 units CAPS capsule Take 1 capsule (50,000 Units total) by mouth every 7 (seven) days. 12 capsule 0  . Tuberculin-Allergy Syringes 27G X 1/2" 1 ML MISC Patient to inject methotrexate once weekly. 12 each 3   No current facility-administered medications for this visit.  Review of Systems: Review of Systems  Constitutional: Negative for diaphoresis and fever.  Musculoskeletal: Positive for neck pain.  All other systems reviewed and are negative.    PHYSICAL EXAMINATION Blood pressure 112/70, temperature 98.7 F (37.1 C), temperature source Oral, resp. rate 18, height _0  (1.676 m), weight 138 lb 11.2 oz (62.9 kg), SpO2 100 %.  ECOG PERFORMANCE STATUS: 1 - Symptomatic but completely ambulatory  Physical Exam  Constitutional: She is oriented to person, place, and time and well-developed, well-nourished, and in no distress.  HENT:  Head: Normocephalic and atraumatic.  Mouth/Throat: Oropharynx is clear and moist. No oropharyngeal exudate.  Eyes: Pupils are equal, round, and reactive to light. Conjunctivae are normal.  Neck: No thyromegaly present.  Cardiovascular: Normal rate, regular rhythm, normal heart sounds and intact distal pulses.   No murmur heard. Pulmonary/Chest: Effort normal and breath sounds normal. No respiratory distress. She has no wheezes.  Abdominal: Soft. Bowel sounds are normal. She exhibits no distension and no mass. There is no tenderness. There is no rebound and no guarding.  Musculoskeletal: Normal range of motion. She exhibits no edema or tenderness.  Lymphadenopathy:    She has no cervical adenopathy.  Neurological: She is alert and oriented to person, place, and time. She has normal reflexes. No cranial nerve deficit.  Skin: Skin is  warm and dry. No rash noted. She is not diaphoretic. No erythema.     LABORATORY DATA: I have personally reviewed the data as listed: Appointment on 11/12/2016  Component Date Value Ref Range Status  . WBC 11/12/2016 6.9  3.9 - 10.3 10e3/uL Final  . NEUT# 11/12/2016 4.6  1.5 - 6.5 10e3/uL Final  . HGB 11/12/2016 13.1  11.6 - 15.9 g/dL Final  . HCT 11/12/2016 40.5  34.8 - 46.6 % Final  . Platelets 11/12/2016 260  145 - 400 10e3/uL Final  . MCV 11/12/2016 100.7  79.5 - 101.0 fL Final  . MCH 11/12/2016 32.6  25.1 - 34.0 pg Final  . MCHC 11/12/2016 32.3  31.5 - 36.0 g/dL Final  . RBC 11/12/2016 4.02  3.70 - 5.45 10e6/uL Final  . RDW 11/12/2016 15.7* 11.2 - 14.5 % Final  . lymph# 11/12/2016 1.6  0.9 - 3.3 10e3/uL Final  . MONO# 11/12/2016 0.5  0.1 - 0.9 10e3/uL Final  . Eosinophils Absolute 11/12/2016 0.1  0.0 - 0.5 10e3/uL Final  . Basophils Absolute 11/12/2016 0.0  0.0 - 0.1 10e3/uL Final  . NEUT% 11/12/2016 67.2  38.4 - 76.8 % Final  . LYMPH% 11/12/2016 23.9  14.0 - 49.7 % Final  . MONO% 11/12/2016 7.7  0.0 - 14.0 % Final  . EOS% 11/12/2016 0.9  0.0 - 7.0 % Final  . BASO% 11/12/2016 0.3  0.0 - 2.0 % Final  . Sodium 11/12/2016 141  136 - 145 mEq/L Final  . Potassium 11/12/2016 3.8  3.5 - 5.1 mEq/L Final  . Chloride 11/12/2016 106  98 - 109 mEq/L Final  . CO2 11/12/2016 27  22 - 29 mEq/L Final  . Glucose 11/12/2016 87  70 - 140 mg/dl Final   Glucose reference range is for nonfasting patients. Fasting glucose reference range is 70- 100.  Marland Kitchen BUN 11/12/2016 12.0  7.0 - 26.0 mg/dL Final  . Creatinine 11/12/2016 0.9  0.6 - 1.1 mg/dL Final  . Total Bilirubin 11/12/2016 0.30  0.20 - 1.20 mg/dL Final  . Alkaline Phosphatase 11/12/2016 61  40 - 150 U/L Final  . AST 11/12/2016 18  5 -  34 U/L Final  . ALT 11/12/2016 12  0 - 55 U/L Final  . Total Protein 11/12/2016 7.0  6.4 - 8.3 g/dL Final  . Albumin 11/12/2016 3.7  3.5 - 5.0 g/dL Final  . Calcium 11/12/2016 9.6  8.4 - 10.4 mg/dL Final  .  Anion Gap 11/12/2016 8  3 - 11 mEq/L Final  . EGFR 11/12/2016 79* >90 ml/min/1.73 m2 Final   eGFR is calculated using the CKD-EPI Creatinine Equation (2009)  . LDH 11/12/2016 187  125 - 245 U/L Final  . Uric Acid, Serum 11/12/2016 3.6  2.6 - 7.4 mg/dl Final  . IgG, Qn, Serum 11/12/2016 1,055  700 - 1600 mg/dL Final  . IgA, Qn, Serum 11/12/2016 218  87 - 352 mg/dL Final  . IgM, Qn, Serum 11/12/2016 41  26 - 217 mg/dL Final  . Total Protein 11/12/2016 6.3  6.0 - 8.5 g/dL Final  . Albumin SerPl Elph-Mcnc 11/12/2016 3.3  2.9 - 4.4 g/dL Final  . Alpha 1 11/12/2016 0.2  0.0 - 0.4 g/dL Final  . Alpha2 Glob SerPl Elph-Mcnc 11/12/2016 0.7  0.4 - 1.0 g/dL Final  . B-Globulin SerPl Elph-Mcnc 11/12/2016 1.0  0.7 - 1.3 g/dL Final  . Gamma Glob SerPl Elph-Mcnc 11/12/2016 1.1  0.4 - 1.8 g/dL Final  . M Protein SerPl Elph-Mcnc 11/12/2016 0.2* Not Observed g/dL Final  . Globulin, Total 11/12/2016 3.0  2.2 - 3.9 g/dL Final  . Albumin/Glob SerPl 11/12/2016 1.2  0.7 - 1.7 Final  . IFE 1 11/12/2016 Comment   Final   Comment: Immunofixation shows IgA monoclonal protein with kappa light chain specificity.   . Please Note 11/12/2016 Comment   Final   Comment: Protein electrophoresis scan will follow via computer, mail, or courier delivery.   Loletha Grayer Kappa Free Light Chain 11/12/2016 10.1  3.3 - 19.4 mg/L Final  . Ig Lambda Free Light Chain 11/12/2016 9.6  5.7 - 26.3 mg/L Final  . Kappa/Lambda FluidC Ratio 11/12/2016 1.05  0.26 - 1.65 Final  . PROTEIN,TOTAL,URINE 11/17/2016 6.6  Not Estab. mg/dL Final   Total Volume: 1250 mL  . Prot,24hr calculated 11/17/2016 83  30 - 150 mg/24 hr Final  . ALBUMIN, U 11/17/2016 100.0  % Final  . ALPHA 1 URINE 11/17/2016 0.0  % Final  . ALPHA-2-GLOBULIN, U 11/17/2016 0.0  % Final  . % BETA, Urine 11/17/2016 0.0  % Final  . GAMMA GLOBULIN URINE 11/17/2016 0.0  % Final  . M-SPIKE, % 11/17/2016 Not Observed  Not Observed % Final  . Immunofixation Result, Urine 11/17/2016  Comment   Final   An apparent normal immunofixation pattern.  Marland Kitchen NOTE: 11/17/2016 Comment   Final   Comment: Protein electrophoresis scan will follow via computer, mail, or courier delivery.   . Free Kappa Lt Chains,Ur 11/17/2016 8.86  1.35 - 24.19 mg/L Final  . Free Lambda Lt Chains,Ur 11/17/2016 0.44  0.24 - 6.66 mg/L Final   **Results verified by repeat testing**  . Kappa/Lambda Ratio,U 11/17/2016 20.14* 2.04 - 10.37 Final       Ardath Sax, MD

## 2016-12-01 ENCOUNTER — Encounter: Payer: Self-pay | Admitting: Rheumatology

## 2016-12-11 ENCOUNTER — Telehealth: Payer: Self-pay | Admitting: Internal Medicine

## 2016-12-11 MED ORDER — PREDNISONE 1 MG PO TABS: 1.0000 mg | ORAL_TABLET | Freq: Every day | ORAL | 0 refills | Status: DC

## 2016-12-11 NOTE — Telephone Encounter (Signed)
Pharmacy called requesting medication refill on predniSONE (DELTASONE) 1 MG tablet  Please f/up

## 2016-12-11 NOTE — Telephone Encounter (Signed)
Will forward to PCP 

## 2016-12-30 ENCOUNTER — Telehealth: Payer: Self-pay | Admitting: Rheumatology

## 2016-12-30 NOTE — Telephone Encounter (Signed)
Patient returned my call and I walked her through the process of drawing her injection up. Patient states she was able to get her dose for today but if she has anymore concerns she will come by the office for a demonstration.

## 2016-12-30 NOTE — Telephone Encounter (Signed)
Attempted to call the patient and left message for patient to call the office.

## 2016-12-30 NOTE — Telephone Encounter (Signed)
Patient having trouble filling her injector with MTX. Please call to advise.

## 2017-01-25 ENCOUNTER — Other Ambulatory Visit: Payer: Self-pay | Admitting: *Deleted

## 2017-01-25 ENCOUNTER — Other Ambulatory Visit: Payer: Self-pay

## 2017-01-25 DIAGNOSIS — M069 Rheumatoid arthritis, unspecified: Secondary | ICD-10-CM

## 2017-01-25 DIAGNOSIS — Z79899 Other long term (current) drug therapy: Secondary | ICD-10-CM

## 2017-01-25 DIAGNOSIS — E559 Vitamin D deficiency, unspecified: Secondary | ICD-10-CM

## 2017-01-25 DIAGNOSIS — D899 Disorder involving the immune mechanism, unspecified: Secondary | ICD-10-CM

## 2017-01-25 DIAGNOSIS — D849 Immunodeficiency, unspecified: Secondary | ICD-10-CM

## 2017-01-25 DIAGNOSIS — Z7952 Long term (current) use of systemic steroids: Secondary | ICD-10-CM

## 2017-01-25 DIAGNOSIS — M0609 Rheumatoid arthritis without rheumatoid factor, multiple sites: Secondary | ICD-10-CM

## 2017-01-26 NOTE — Progress Notes (Signed)
Advised vitamin D 2000 units daily for maintenance.

## 2017-01-27 LAB — CBC WITH DIFFERENTIAL/PLATELET
BASOS PCT: 0.5 %
Basophils Absolute: 21 cells/uL (ref 0–200)
EOS PCT: 2.5 %
Eosinophils Absolute: 103 cells/uL (ref 15–500)
HCT: 39.3 % (ref 35.0–45.0)
Hemoglobin: 13.3 g/dL (ref 11.7–15.5)
LYMPHS ABS: 2066 {cells}/uL (ref 850–3900)
MCH: 32.5 pg (ref 27.0–33.0)
MCHC: 33.8 g/dL (ref 32.0–36.0)
MCV: 96.1 fL (ref 80.0–100.0)
MPV: 9.8 fL (ref 7.5–12.5)
Monocytes Relative: 7.9 %
NEUTROS PCT: 38.7 %
Neutro Abs: 1587 cells/uL (ref 1500–7800)
PLATELETS: 278 10*3/uL (ref 140–400)
RBC: 4.09 10*6/uL (ref 3.80–5.10)
RDW: 13.9 % (ref 11.0–15.0)
TOTAL LYMPHOCYTE: 50.4 %
WBC: 4.1 10*3/uL (ref 3.8–10.8)
WBCMIX: 324 {cells}/uL (ref 200–950)

## 2017-01-27 LAB — COMPLETE METABOLIC PANEL WITH GFR
AG RATIO: 1.6 (calc) (ref 1.0–2.5)
ALKALINE PHOSPHATASE (APISO): 56 U/L (ref 33–130)
ALT: 8 U/L (ref 6–29)
AST: 14 U/L (ref 10–35)
Albumin: 4.2 g/dL (ref 3.6–5.1)
BILIRUBIN TOTAL: 0.3 mg/dL (ref 0.2–1.2)
BUN: 9 mg/dL (ref 7–25)
CALCIUM: 9.7 mg/dL (ref 8.6–10.4)
CHLORIDE: 107 mmol/L (ref 98–110)
CO2: 29 mmol/L (ref 20–32)
Creat: 0.82 mg/dL (ref 0.50–0.99)
GFR, Est African American: 89 mL/min/{1.73_m2} (ref >=60)
GFR, Est Non African American: 77 mL/min/{1.73_m2} (ref >=60)
GLOBULIN: 2.6 g/dL (ref 1.9–3.7)
Glucose, Bld: 96 mg/dL (ref 65–99)
Potassium: 4.2 mmol/L (ref 3.5–5.3)
SODIUM: 142 mmol/L (ref 135–146)
Total Protein: 6.8 g/dL (ref 6.1–8.1)

## 2017-01-27 LAB — CYCLIC CITRUL PEPTIDE ANTIBODY, IGG

## 2017-01-27 LAB — 14-3-3 ETA PROTEIN: 14-3-3 eta Protein: <0.2 ng/mL (ref <0.2)

## 2017-01-27 LAB — VITAMIN D 25 HYDROXY (VIT D DEFICIENCY, FRACTURES): VIT D 25 HYDROXY: 45 ng/mL (ref 30–100)

## 2017-02-03 ENCOUNTER — Encounter: Payer: Self-pay | Admitting: Rheumatology

## 2017-02-10 ENCOUNTER — Telehealth: Payer: Self-pay | Admitting: Rheumatology

## 2017-02-10 NOTE — Telephone Encounter (Signed)
Patient is having trouble filing the syringe for her Methotrexate injection.  She wants to know if there is a way to get the syringes prefilled.  CB [phone]  Please advise

## 2017-02-11 ENCOUNTER — Encounter: Payer: Self-pay | Admitting: Rheumatology

## 2017-02-11 ENCOUNTER — Other Ambulatory Visit (HOSPITAL_BASED_OUTPATIENT_CLINIC_OR_DEPARTMENT_OTHER): Payer: BLUE CROSS/BLUE SHIELD

## 2017-02-11 DIAGNOSIS — D472 Monoclonal gammopathy: Secondary | ICD-10-CM | POA: Diagnosis not present

## 2017-02-11 LAB — COMPREHENSIVE METABOLIC PANEL
ALBUMIN: 3.7 g/dL (ref 3.5–5.0)
ALK PHOS: 63 U/L (ref 40–150)
ALT: 12 U/L (ref 0–55)
ANION GAP: 7 meq/L (ref 3–11)
AST: 14 U/L (ref 5–34)
BUN: 10.4 mg/dL (ref 7.0–26.0)
CALCIUM: 9.3 mg/dL (ref 8.4–10.4)
CO2: 25 mEq/L (ref 22–29)
Chloride: 108 mEq/L (ref 98–109)
Creatinine: 0.8 mg/dL (ref 0.6–1.1)
EGFR: >60 mL/min/{1.73_m2} (ref >60)
Glucose: 85 mg/dl (ref 70–140)
Potassium: 3.9 mEq/L (ref 3.5–5.1)
Sodium: 141 mEq/L (ref 136–145)
Total Bilirubin: 0.37 mg/dL (ref 0.20–1.20)
Total Protein: 7 g/dL (ref 6.4–8.3)

## 2017-02-11 LAB — CBC WITH DIFFERENTIAL/PLATELET
BASO%: 0.4 % (ref 0.0–2.0)
Basophils Absolute: 0 10*3/uL (ref 0.0–0.1)
EOS ABS: 0.1 10*3/uL (ref 0.0–0.5)
EOS%: 2 % (ref 0.0–7.0)
HEMATOCRIT: 39.6 % (ref 34.8–46.6)
HGB: 12.7 g/dL (ref 11.6–15.9)
LYMPH#: 2 10*3/uL (ref 0.9–3.3)
LYMPH%: 44 % (ref 14.0–49.7)
MCH: 32 pg (ref 25.1–34.0)
MCHC: 32.1 g/dL (ref 31.5–36.0)
MCV: 99.7 fL (ref 79.5–101.0)
MONO#: 0.3 10*3/uL (ref 0.1–0.9)
MONO%: 7.1 % (ref 0.0–14.0)
NEUT#: 2.1 10*3/uL (ref 1.5–6.5)
NEUT%: 46.5 % (ref 38.4–76.8)
PLATELETS: 239 10*3/uL (ref 145–400)
RBC: 3.97 10*6/uL (ref 3.70–5.45)
RDW: 14.9 % — ABNORMAL HIGH (ref 11.2–14.5)
WBC: 4.5 10*3/uL (ref 3.9–10.3)

## 2017-02-11 LAB — LACTATE DEHYDROGENASE: LDH: 173 U/L (ref 125–245)

## 2017-02-11 NOTE — Telephone Encounter (Signed)
Patient is coming to office 02/15/17 for a demonstration on how to properly draw up the MTX into the syringe.

## 2017-02-12 LAB — KAPPA/LAMBDA LIGHT CHAINS
IG KAPPA FREE LIGHT CHAIN: 10.6 mg/L (ref 3.3–19.4)
Ig Lambda Free Light Chain: 8.5 mg/L (ref 5.7–26.3)
Kappa/Lambda FluidC Ratio: 1.25 (ref 0.26–1.65)

## 2017-02-15 ENCOUNTER — Ambulatory Visit: Payer: BLUE CROSS/BLUE SHIELD

## 2017-02-15 LAB — MULTIPLE MYELOMA PANEL, SERUM
ALBUMIN SERPL ELPH-MCNC: 3.4 g/dL (ref 2.9–4.4)
Albumin/Glob SerPl: 1.1 (ref 0.7–1.7)
Alpha 1: 0.2 g/dL (ref 0.0–0.4)
Alpha2 Glob SerPl Elph-Mcnc: 0.7 g/dL (ref 0.4–1.0)
B-Globulin SerPl Elph-Mcnc: 1.1 g/dL (ref 0.7–1.3)
GAMMA GLOB SERPL ELPH-MCNC: 1.1 g/dL (ref 0.4–1.8)
GLOBULIN, TOTAL: 3.1 g/dL (ref 2.2–3.9)
IGA/IMMUNOGLOBULIN A, SERUM: 229 mg/dL (ref 87–352)
IgG, Qn, Serum: 1050 mg/dL (ref 700–1600)
IgM, Qn, Serum: 39 mg/dL (ref 26–217)
M Protein SerPl Elph-Mcnc: 0.2 g/dL — ABNORMAL HIGH
TOTAL PROTEIN: 6.5 g/dL (ref 6.0–8.5)

## 2017-02-18 ENCOUNTER — Inpatient Hospital Stay: Payer: BLUE CROSS/BLUE SHIELD | Attending: Hematology and Oncology | Admitting: Hematology and Oncology

## 2017-02-18 ENCOUNTER — Telehealth: Payer: Self-pay | Admitting: Hematology and Oncology

## 2017-02-18 ENCOUNTER — Encounter: Payer: Self-pay | Admitting: Hematology and Oncology

## 2017-02-18 VITALS — BP 120/66 | HR 70 | Temp 99.0°F | Resp 18 | Ht 66.0 in | Wt 135.0 lb

## 2017-02-18 DIAGNOSIS — D472 Monoclonal gammopathy: Secondary | ICD-10-CM | POA: Diagnosis present

## 2017-02-18 DIAGNOSIS — R1013 Epigastric pain: Secondary | ICD-10-CM | POA: Insufficient documentation

## 2017-02-18 DIAGNOSIS — R0789 Other chest pain: Secondary | ICD-10-CM | POA: Diagnosis not present

## 2017-02-18 NOTE — Telephone Encounter (Signed)
Scheduled appt per 1/10 los - Gave patient aVS and calender per los.

## 2017-02-19 ENCOUNTER — Other Ambulatory Visit: Payer: Self-pay | Admitting: Rheumatology

## 2017-02-19 DIAGNOSIS — Z79899 Other long term (current) drug therapy: Secondary | ICD-10-CM

## 2017-02-19 NOTE — Telephone Encounter (Signed)
Last Visit: 11/03/16 Next Visit: 04/23/17 Labs: 02/11/17 cbc/cmp wnl  Okay to refill per Dr. Estanislado Pandy

## 2017-03-01 DIAGNOSIS — R0789 Other chest pain: Secondary | ICD-10-CM | POA: Insufficient documentation

## 2017-03-01 NOTE — Assessment & Plan Note (Signed)
62 y.o. 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 The monoclonal protein may represent antibodies associated with arthritis or may represent development of a plasma cell disorder developing in the context of autoimmune condition.   Repeat lab work obtained prior to this clinic visit demonstrates no evidence of progression of monoclonal gammopathy, no significant hematological changes, or progressive renal or progressive renal dysfunction.  Plan: --Continue observation/monitoring --RTC 3 months with labs as outlined below. On biochemical progression, we will repeat imaging and may consider obtaining bone marrow biopsy

## 2017-03-01 NOTE — Assessment & Plan Note (Signed)
Patient had an episode of chest pain last week.  Initially, my suspicion was that of a cardiogenic chest pain, but additional questioning, clinical assessment, and EKG demonstrate no evidence of myocardial injury or dysrhythmia.  Physical exam is positive for mild epigastric tenderness suggesting possible reflux or gastritis.  Plan: --Over-the-counter Maalox or Tums if recurrent abdominal discomfort

## 2017-03-01 NOTE — Progress Notes (Signed)
[name] Follow-up Visit:  Assessment: Monoclonal gammopathy 62 y.o. 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 The monoclonal protein may represent antibodies associated with arthritis or may represent development of a plasma cell disorder developing in the context of autoimmune condition.   Repeat lab work obtained prior to this clinic visit demonstrates no evidence of progression of monoclonal gammopathy, no significant hematological changes, or progressive renal or progressive renal dysfunction.  Plan: --Continue observation/monitoring --RTC 3 months with labs as outlined below. On biochemical progression, we will repeat imaging and may consider obtaining bone marrow biopsy  Other chest pain Patient had an episode of chest pain last week.  Initially, my suspicion was that of a cardiogenic chest pain, but additional questioning, clinical assessment, and EKG demonstrate no evidence of myocardial injury or dysrhythmia.  Physical exam is positive for mild epigastric tenderness suggesting possible reflux or gastritis.  Plan: --Over-the-counter Maalox or Tums if recurrent abdominal discomfort   Voice recognition software was used and creation of this note. Despite my best effort at editing the text, some misspelling/errors may have occurred.  Orders Placed This Encounter  Procedures  . CBC with Differential (Cancer Center Only)    Standing Status:   Future    Standing Expiration Date:   02/18/2018  . CMP ([facility] only)    Standing Status:   Future    Standing Expiration Date:   02/18/2018  . Lactate dehydrogenase (LDH)    Standing Status:   Future    Standing Expiration Date:   02/18/2018  . Beta 2 microglobulin    Standing Status:   Future    Standing Expiration Date:   02/18/2018  . Multiple Myeloma Panel (SPEP&IFE w/QIG)    Standing Status:   Future    Standing Expiration  Date:   02/18/2018  . Kappa/lambda light chains    Standing Status:   Future    Standing Expiration Date:   02/18/2018  . EKG 12-Lead    Scheduling Instructions:     Now, in the room    Order Specific Question:   Where should this test be performed    Answer:   [facility]    Cancer Staging No matching staging information was found for the patient.  All questions were answered.  . The patient knows to call the clinic with any problems, questions or concerns.  This note was electronically signed.    History of Presenting Illness Vickie Pearson is a 62 y.o. female followed in the [facility] for diagnosis of monoclonal gammopathy of unknown significance (MGUS). Patient's current medical history is dominated by significantly uncomfortable seronegative rheumatoid arthritis. Patient is currently receiving methotrexate. In the past, hematuria initiation was considered, but patient refused that. The present time. Patient denies any fevers, chills, night sweats. Patient reports feeling reasonably well. She does complain off some pain in the left neck with some radiation down into the left upper extremity. Currently, denies having significant joint pain. In the past, did have cortisone injections in the knees was significant relief. Denies any respiratory, gastrointestinal, into urinary, or dermatological complaints.  Patient returns to the clinic for hematological monitoring.  Last week, patient reports feeling not well with chest congestion, pain in the chest and rapid heartbeat.  She also had some pain in the lower back.  This was associated with dizziness, no shortness of breath, no fever.  There was no pain radiation to the left  shoulder or left arm.  Patient did not have any presyncopal or syncopal events.  At this time, symptoms appear to have resolved completely.  Oncological/hematological History: --Labs, 10/02/16: tProt 6.5, Alb 3.9, Ca 9.4, Cr 0.7, AP 53;     SPEP/SIFE -- IgA  kappa monoclonal protein present; IgG 1167, IgA 233, IgM 39;                                                         WBC 4.2, Hgb 12.6, Plt 314; ESR 7, ANA & RF -- negative --Labs, 11/12/16: tProt 7.0, Alb 3.7, Ca 9.6, Cr 0.9, AP 61;     SPEP/SIFE -- M-spike 0.2g/dL, IgA kappa;                                                             kappa 10.1, lambda 9.6, KLR 1.05; WBC 6.9, Hgb 13.1, Plt 260;  --Skeletal Survey, 11/24/16: no evidence of skeletal lytic lesions. --Labs, 02/11/17: tProt 7.0, Alb 3.7, Ca 9.3, Cr 0.8, AP 63, LDH 173; SPEP/SIFE -- M-spike 0.2g/dL, IgA kappa;       IgG 1050, IgA 229, IgM 39; kappa 10.6, lambda 8.5, KLR 1.25; WBC 4.5, Hgb 12.7, Plt 239;   Medical History: Past Medical History:  Diagnosis Date  . Colon polyps   . Costochondritis   . DDD (degenerative disc disease), cervical   . Degenerative arthritis of knee    Bilateral  . Degenerative disc disease, cervical   . Degenerative disc disease, cervical   . Fibromyalgia   . Hyperlipidemia   . Monoclonal gammopathy   . Multiple thyroid nodules   . Rheumatoid arthritis [facility])     Surgical History: History reviewed. No pertinent surgical history.  Family History: Family History  Problem Relation Age of Onset  . Hypertension Mother   . Colon polyps Mother   . Stroke Mother   . Arthritis Mother   . Hypertension Father   . Heart disease Father   . Arthritis Father   . Kidney disease Father   . Hypertension Sister   . Hyperlipidemia Sister   . Hypertension Brother   . Hyperlipidemia Brother     Social History: Social History   Socioeconomic History  . Marital status: Widowed    Spouse name: Not on file  . Number of children: Not on file  . Years of education: Not on file  . Highest education level: Not on file  Social Needs  . Financial resource strain: Not on file  . Food insecurity - worry: Not on file  . Food insecurity - inability: Not on file  . Transportation needs - medical: Not on file   . Transportation needs - non-medical: Not on file  Occupational History  . Not on file  Tobacco Use  . Smoking status: Former Smoker    Packs/day: 1.00    Types: Cigarettes    Last attempt to quit: 07/24/1988    Years since quitting: 28.6  . Smokeless tobacco: Never Used  Substance and Sexual Activity  . Alcohol use: Yes    Comment: occasionally  . Drug use: No  .  Sexual activity: Not on file  Other Topics Concern  . Not on file  Social History Narrative  . Not on file    Allergies: Allergies  Allergen Reactions  . Penicillins Hives  . Piroxicam Hives  . Sulfa Antibiotics Hives    Medications:  Current Outpatient Medications  Medication Sig Dispense Refill  . folic acid (FOLVITE) 1 MG tablet Take 1 tablet (1 mg total) by mouth daily. (Patient taking differently: Take 2 mg by mouth daily. ) 100 tablet 2  . gabapentin (NEURONTIN) 300 MG capsule Take 300 mg by mouth as needed.    . pravastatin (PRAVACHOL) 20 MG tablet Take one tab every other day. 60 tablet 3  . Tuberculin-Allergy Syringes 27G X 1/2" 1 ML MISC Patient to inject methotrexate once weekly. 12 each 3  . methotrexate 50 MG/2ML injection INJECT 0.8 MLS (20 MG TOTAL) INTO THE SKIN ONCE A WEEK. 10 mL 0  . predniSONE (DELTASONE) 1 MG tablet Take 1 tablet (1 mg total) by mouth daily with breakfast. (Patient not taking: Reported on 02/18/2017) 60 tablet 0  . Vitamin D, Ergocalciferol, (DRISDOL) 50000 units CAPS capsule Take 1 capsule (50,000 Units total) by mouth every 7 (seven) days. (Patient not taking: Reported on 02/18/2017) 12 capsule 0   No current facility-administered medications for this visit.     Review of Systems: Review of Systems  Constitutional: Negative for diaphoresis and fever.  Musculoskeletal: Positive for neck pain.  All other systems reviewed and are negative.    PHYSICAL EXAMINATION Blood pressure 120/66, pulse 70, temperature 99 F (37.2 C), temperature source Oral, resp. rate 18, height  '5\' 6"'  (1.676 m), weight 135 lb (61.2 kg), SpO2 100 %.  ECOG PERFORMANCE STATUS: 1 - Symptomatic but completely ambulatory  Physical Exam  Constitutional: She is oriented to person, place, and time and well-developed, well-nourished, and in no distress.  HENT:  Head: Normocephalic and atraumatic.  Mouth/Throat: Oropharynx is clear and moist. No oropharyngeal exudate.  Eyes: Conjunctivae are normal. Pupils are equal, round, and reactive to light.  Neck: No thyromegaly present.  Cardiovascular: Normal rate, regular rhythm, normal heart sounds and intact distal pulses.  No murmur heard. Pulmonary/Chest: Effort normal and breath sounds normal. No respiratory distress. She has no wheezes.  Abdominal: Soft. Bowel sounds are normal. She exhibits no distension and no mass. There is no tenderness. There is no rebound and no guarding.  Musculoskeletal: Normal range of motion. She exhibits no edema or tenderness.  Lymphadenopathy:    She has no cervical adenopathy.  Neurological: She is alert and oriented to person, place, and time. She has normal reflexes. No cranial nerve deficit.  Skin: Skin is warm and dry. No rash noted. She is not diaphoretic. No erythema.     LABORATORY DATA: I have personally reviewed the data as listed: No visits with results within 1 Week(s) from this visit.  Latest known visit with results is:  Appointment on 02/11/2017  Component Date Value Ref Range Status  . Ig Kappa Free Light Chain 02/11/2017 10.6  3.3 - 19.4 mg/L Final  . Ig Lambda Free Light Chain 02/11/2017 8.5  5.7 - 26.3 mg/L Final  . Kappa/Lambda FluidC Ratio 02/11/2017 1.25  0.26 - 1.65 Final  . IgG, Qn, Serum 02/11/2017 1,050  700 - 1600 mg/dL Final  . IgA, Qn, Serum 02/11/2017 229  87 - 352 mg/dL Final  . IgM, Qn, Serum 02/11/2017 39  26 - 217 mg/dL Final  . Total Protein 02/11/2017 6.5  6.0 - 8.5 g/dL Final  . Albumin SerPl Elph-Mcnc 02/11/2017 3.4  2.9 - 4.4 g/dL Final  . Alpha 1 02/11/2017 0.2  0.0  - 0.4 g/dL Final  . Alpha2 Glob SerPl Elph-Mcnc 02/11/2017 0.7  0.4 - 1.0 g/dL Final  . B-Globulin SerPl Elph-Mcnc 02/11/2017 1.1  0.7 - 1.3 g/dL Final  . Gamma Glob SerPl Elph-Mcnc 02/11/2017 1.1  0.4 - 1.8 g/dL Final  . M Protein SerPl Elph-Mcnc 02/11/2017 0.2* Not Observed g/dL Final  . Globulin, Total 02/11/2017 3.1  2.2 - 3.9 g/dL Final  . Albumin/Glob SerPl 02/11/2017 1.1  0.7 - 1.7 Final  . IFE 1 02/11/2017 Comment   Final   Comment: Immunofixation shows IgA monoclonal protein with kappa light chain specificity.   . Please Note 02/11/2017 Comment   Final   Comment: Protein electrophoresis scan will follow via computer, mail, or courier delivery.   Marland Kitchen LDH 02/11/2017 173  125 - 245 U/L Final  . Sodium 02/11/2017 141  136 - 145 mEq/L Final  . Potassium 02/11/2017 3.9  3.5 - 5.1 mEq/L Final  . Chloride 02/11/2017 108  98 - 109 mEq/L Final  . CO2 02/11/2017 25  22 - 29 mEq/L Final  . Glucose 02/11/2017 85  70 - 140 mg/dl Final   Glucose reference range is for nonfasting patients. Fasting glucose reference range is 70- 100.  Marland Kitchen BUN 02/11/2017 10.4  7.0 - 26.0 mg/dL Final  . Creatinine 02/11/2017 0.8  0.6 - 1.1 mg/dL Final  . Total Bilirubin 02/11/2017 0.37  0.20 - 1.20 mg/dL Final  . Alkaline Phosphatase 02/11/2017 63  40 - 150 U/L Final  . AST 02/11/2017 14  5 - 34 U/L Final  . ALT 02/11/2017 12  0 - 55 U/L Final  . Total Protein 02/11/2017 7.0  6.4 - 8.3 g/dL Final  . Albumin 02/11/2017 3.7  3.5 - 5.0 g/dL Final  . Calcium 02/11/2017 9.3  8.4 - 10.4 mg/dL Final  . Anion Gap 02/11/2017 7  3 - 11 mEq/L Final  . EGFR 02/11/2017 >60  >60 ml/min/1.73 m2 Final   eGFR is calculated using the CKD-EPI Creatinine Equation (2009)  . WBC 02/11/2017 4.5  3.9 - 10.3 10e3/uL Final  . NEUT# 02/11/2017 2.1  1.5 - 6.5 10e3/uL Final  . HGB 02/11/2017 12.7  11.6 - 15.9 g/dL Final  . HCT 02/11/2017 39.6  34.8 - 46.6 % Final  . Platelets 02/11/2017 239  145 - 400 10e3/uL Final  . MCV 02/11/2017  99.7  79.5 - 101.0 fL Final  . MCH 02/11/2017 32.0  25.1 - 34.0 pg Final  . MCHC 02/11/2017 32.1  31.5 - 36.0 g/dL Final  . RBC 02/11/2017 3.97  3.70 - 5.45 10e6/uL Final  . RDW 02/11/2017 14.9* 11.2 - 14.5 % Final  . lymph# 02/11/2017 2.0  0.9 - 3.3 10e3/uL Final  . MONO# 02/11/2017 0.3  0.1 - 0.9 10e3/uL Final  . Eosinophils Absolute 02/11/2017 0.1  0.0 - 0.5 10e3/uL Final  . Basophils Absolute 02/11/2017 0.0  0.0 - 0.1 10e3/uL Final  . NEUT% 02/11/2017 46.5  38.4 - 76.8 % Final  . LYMPH% 02/11/2017 44.0  14.0 - 49.7 % Final  . MONO% 02/11/2017 7.1  0.0 - 14.0 % Final  . EOS% 02/11/2017 2.0  0.0 - 7.0 % Final  . BASO% 02/11/2017 0.4  0.0 - 2.0 % Final       Ardath Sax, MD

## 2017-03-24 ENCOUNTER — Ambulatory Visit: Payer: BLUE CROSS/BLUE SHIELD | Admitting: Rheumatology

## 2017-03-24 ENCOUNTER — Encounter: Payer: Self-pay | Admitting: Rheumatology

## 2017-04-19 ENCOUNTER — Encounter: Payer: Self-pay | Admitting: Rheumatology

## 2017-04-20 ENCOUNTER — Other Ambulatory Visit: Payer: Self-pay | Admitting: Rheumatology

## 2017-04-20 DIAGNOSIS — Z79899 Other long term (current) drug therapy: Secondary | ICD-10-CM

## 2017-04-20 NOTE — Telephone Encounter (Signed)
Last Visit: 11/03/16 Next Visit: 04/23/17  Okay to refill per Dr. Estanislado Pandy

## 2017-04-23 ENCOUNTER — Encounter: Payer: Self-pay | Admitting: Physician Assistant

## 2017-04-23 ENCOUNTER — Ambulatory Visit: Payer: BLUE CROSS/BLUE SHIELD | Admitting: Rheumatology

## 2017-04-23 ENCOUNTER — Ambulatory Visit: Payer: BLUE CROSS/BLUE SHIELD | Admitting: Physician Assistant

## 2017-04-23 VITALS — BP 123/75 | HR 67 | Resp 14 | Ht 66.0 in | Wt 137.0 lb

## 2017-04-23 DIAGNOSIS — Z79899 Other long term (current) drug therapy: Secondary | ICD-10-CM

## 2017-04-23 DIAGNOSIS — D472 Monoclonal gammopathy: Secondary | ICD-10-CM | POA: Diagnosis not present

## 2017-04-23 DIAGNOSIS — E782 Mixed hyperlipidemia: Secondary | ICD-10-CM

## 2017-04-23 DIAGNOSIS — M810 Age-related osteoporosis without current pathological fracture: Secondary | ICD-10-CM

## 2017-04-23 DIAGNOSIS — M7062 Trochanteric bursitis, left hip: Secondary | ICD-10-CM

## 2017-04-23 DIAGNOSIS — M170 Bilateral primary osteoarthritis of knee: Secondary | ICD-10-CM | POA: Diagnosis not present

## 2017-04-23 DIAGNOSIS — G8929 Other chronic pain: Secondary | ICD-10-CM

## 2017-04-23 DIAGNOSIS — M0609 Rheumatoid arthritis without rheumatoid factor, multiple sites: Secondary | ICD-10-CM | POA: Diagnosis not present

## 2017-04-23 DIAGNOSIS — E559 Vitamin D deficiency, unspecified: Secondary | ICD-10-CM

## 2017-04-23 DIAGNOSIS — H2013 Chronic iridocyclitis, bilateral: Secondary | ICD-10-CM | POA: Diagnosis not present

## 2017-04-23 DIAGNOSIS — E042 Nontoxic multinodular goiter: Secondary | ICD-10-CM | POA: Diagnosis not present

## 2017-04-23 DIAGNOSIS — M25562 Pain in left knee: Secondary | ICD-10-CM | POA: Diagnosis not present

## 2017-04-23 MED ORDER — TRIAMCINOLONE ACETONIDE 40 MG/ML IJ SUSP
40.0000 mg | INTRAMUSCULAR | Status: AC | PRN
  Administered 2017-04-23: 40 mg via INTRA_ARTICULAR

## 2017-04-23 MED ORDER — LIDOCAINE HCL 1 % IJ SOLN
1.5000 mL | INTRAMUSCULAR | Status: AC | PRN
  Administered 2017-04-23: 1.5 mL

## 2017-04-23 NOTE — Progress Notes (Signed)
Office Visit Note  Patient: Vickie Pearson             Date of Birth: Oct 05, 1955           MRN: [id number]             PCP: Ladell Pier, MD Referring: Ladell Pier, MD Visit Date: 04/23/2017 Occupation: @GUAROCC @    Subjective:  Left knee pain    History of Present Illness: Vickie Pearson is a 62 y.o. female with history of rheumatoid arthritis, iritis, and osteoarthritis.  Patient states she continues to be on methotrexate 0.8 mL weekly and folic acid 2 mg daily she is no longer on prednisone.  Patient states her rheumatoid arthritis seems to be very well controlled.  She has been having increased pain in her left knee for the past 2-3 weeks.  She denies any recent injuries or falls.  She denies any overuse activities.  She denies any mechanical symptoms or instability.  She states she has some radiation of pain down her leg.  Denies any numbness or tingling.  Denies any pain in her right knee.  She denies any joint swelling.  She states she experiences some occasional discomfort in her bilateral hands and bilateral feet.  She denies any joint swelling.  She reports some lower back pain.   Activities of Daily Living:  Patient reports morning stiffness for 20 minutes.   Patient Denies nocturnal pain.  Difficulty dressing/grooming: Denies Difficulty climbing stairs: Denies Difficulty getting out of chair: Denies Difficulty using hands for taps, buttons, cutlery, and/or writing: Denies   Review of Systems  Constitutional: Positive for fatigue. Negative for weakness.  HENT: Positive for mouth sores and mouth dryness. Negative for nose dryness.   Eyes: Positive for dryness. Negative for redness and visual disturbance.  Respiratory: Negative for cough, hemoptysis, shortness of breath and difficulty breathing.   Cardiovascular: Negative for chest pain, palpitations, hypertension and swelling in legs/feet.  Gastrointestinal: Negative for blood in stool,  constipation and diarrhea.  Endocrine: Negative for increased urination.  Genitourinary: Negative for painful urination.  Musculoskeletal: Positive for arthralgias, joint pain, morning stiffness and muscle tenderness. Negative for joint swelling, myalgias, muscle weakness and myalgias.  Skin: Negative for color change, pallor, rash, hair loss, nodules/bumps, redness, skin tightness, ulcers and sensitivity to sunlight.  Allergic/Immunologic: Negative for susceptible to infections.  Neurological: Positive for headaches. Negative for dizziness and numbness.  Hematological: Negative for swollen glands.  Psychiatric/Behavioral: Positive for sleep disturbance. Negative for depressed mood. The patient is not nervous/anxious.     PMFS History:  Patient Active Problem List   Diagnosis Date Noted  . Other chest pain 03/01/2017  . Rheumatoid arthritis involving multiple sites ([place]) 07/24/2016  . Cervical radiculopathy 07/24/2016  . Hyperlipidemia 07/24/2016  . Osteoporosis 07/24/2016  . Vitamin D deficiency 07/24/2016  . Multiple thyroid nodules 07/24/2016  . Monoclonal gammopathy 07/24/2016  . Colon polyps 07/24/2016  . 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 Hx of abnormal cervical Pap smear 07/24/2016  . OA (osteoarthritis) of knee 07/24/2016    Past Medical History:  Diagnosis Date  . Colon polyps   . Costochondritis   . DDD (degenerative disc disease), cervical   . Degenerative arthritis of knee    Bilateral  . Degenerative disc disease, cervical   . Degenerative disc disease, cervical   . Fibromyalgia   . Hyperlipidemia   . Monoclonal gammopathy   . Multiple thyroid nodules   . Rheumatoid arthritis ([place])  Family History  Problem Relation Age of Onset  . Hypertension Mother   . Colon polyps Mother   . Stroke Mother   . Arthritis Mother   . Hypertension Father   . Heart disease Father   . Arthritis Father   . Kidney disease Father   . Hypertension Sister   .  Hyperlipidemia Sister   . Hypertension Brother   . Hyperlipidemia Brother    History reviewed. No pertinent surgical history. Social History   Social History Narrative  . Not on file     Objective: Vital Signs: BP 123/75 (BP Location: Left Arm, Patient Position: Sitting, Cuff Size: Normal)   Pulse 67   Resp 14   Ht 5\' 6"  (1.676 m)   Wt 137 lb (62.1 kg)   BMI 22.11 kg/m    Physical Exam  Constitutional: She is oriented to person, place, and time. She appears well-developed and well-nourished.  HENT:  Head: Normocephalic and atraumatic.  Eyes: Conjunctivae and EOM are normal.  Neck: Normal range of motion.  Cardiovascular: Normal rate, regular rhythm, normal heart sounds and intact distal pulses.  Pulmonary/Chest: Effort normal and breath sounds normal.  Abdominal: Soft. Bowel sounds are normal.  Lymphadenopathy:    She has no cervical adenopathy.  Neurological: She is alert and oriented to person, place, and time.  Skin: Skin is warm and dry. Capillary refill takes less than 2 seconds.  Psychiatric: She has a normal mood and affect. Her behavior is normal.  Nursing note and vitals reviewed.    Musculoskeletal Exam: C-spine, thoracic spine, lumbar spine good range of motion.  No midline spinal tenderness.  No SI joint tenderness.  Shoulder joints, elbow joints, wrist joints, MCPs, PIPs, DIPs good range of motion with no synovitis.  No tenderness of MCPs.  She has PIP and DIP synovial thickening consistent with osteoarthritis.  Hip joints good range of motion.  She has tenderness of left trochanteric bursa.  She has limited extension of bilateral knees.  Valgus deformity of left knee.  She has flexion contractures of bilateral knees.  No warmth or effusion.  Ankle joints, MTPs, PIPs, DIPs good range of motion with no synovitis.  No tenderness of MTP joints.  She has dorsal spurs bilaterally.  CDAI Exam: CDAI Homunculus Exam:   Joint Counts:  CDAI Tender Joint count: 0 CDAI  Swollen Joint count: 0  Global Assessments:  Patient Global Assessment: 4 Provider Global Assessment: 3  CDAI Calculated Score: 7    Investigation: No additional findings.   Imaging: No results found.  Speciality Comments: No specialty comments available.    Procedures:  Large Joint Inj: L knee on 04/23/2017 9:46 AM Indications: pain Details: 27 G 1.5 in needle, medial approach  Arthrogram: No  Medications: 1.5 mL lidocaine 1 %; 40 mg triamcinolone acetonide 40 MG/ML Aspirate: 0 mL Outcome: tolerated well, no immediate complications Procedure, treatment alternatives, risks and benefits explained, specific risks discussed. Consent was given by the patient. Immediately prior to procedure a time out was called to verify the correct patient, procedure, equipment, support staff and site/side marked as required. Patient was prepped and draped in the usual sterile fashion.     Allergies: Penicillins; Piroxicam; and Sulfa antibiotics   Assessment / Plan:     Visit Diagnoses: Rheumatoid arthritis of multiple sites with negative rheumatoid factor ([place]): She has no synovitis on exam.  She has not had any recent flares.  She will continue on methotrexate 0.8 mL weekly and folic acid 2  mg daily she does not need any refills at this time.  She is no longer on prednisone.  High risk medication use - Methotrexate 0.8 ml every week, folic acid 2 mg by mouth daily. CBC and CMP on 02/11/17.  CBC and CMP will be checked in April and every 3 months to monitor for drug toxicity.  Chronic iritis of both eyes: No recent flares.  She sees ophthalmologist on a regular basis.  She has some eye dryness today.  Vitamin D deficiency: She takes vitamin D 3000 units daily.  Age-related osteoporosis without current pathological fracture: She takes vitamin D supplements.  Primary osteoarthritis of both knees: She has a valgus deformity of her left knee.  She has limited extension and knee crepitus  bilaterally.  She has no warmth or effusion.  She has been having increased discomfort in her left knee for the past 2-3 weeks.  She requested a cortisone injection today in the office.  She tolerated the procedure well. She was advised to monitor her blood pressure closely following the cortisone injection.  Chronic pain of left knee: She has limited extension and knee crepitus.  She had a left knee x-ray performed on 10/02/2016 which revealed severe medial and lateral compartment narrowing and severe chondromalacia patella.  She requested a cortisone injection today in the office.  She tolerated the procedure well.  Trochanteric bursitis, left hip: She has tenderness of the left trochanteric bursa.  She was given a handout of exercises that she can perform at home.  I demonstrated some these exercises in the office.  Other medical conditions are listed as follows:  Monoclonal gammopathy  Mixed hyperlipidemia  Multiple thyroid nodules     Orders: Orders Placed This Encounter  Procedures  . Large Joint Inj   No orders of the defined types were placed in this encounter.    Follow-Up Instructions: Return in about 5 months (around 09/23/2017) for Rheumatoid arthritis.   Ofilia Neas, PA-C   I examined and evaluated the patient with Hazel Sams PA. The plan of care was discussed as noted above.  Bo Merino, MD  Note - This record has been created using Editor, commissioning.  Chart creation errors have been sought, but may not always  have been located. Such creation errors do not reflect on  the standard of medical care.

## 2017-04-23 NOTE — Patient Instructions (Addendum)
Trochanteric Bursitis Rehab Ask your health care provider which exercises are safe for you. Do exercises exactly as told by your health care provider and adjust them as directed. It is normal to feel mild stretching, pulling, tightness, or discomfort as you do these exercises, but you should stop right away if you feel sudden pain or your pain gets worse.Do not begin these exercises until told by your health care provider. Stretching exercises These exercises warm up your muscles and joints and improve the movement and flexibility of your hip. These exercises also help to relieve pain and stiffness. Exercise A: Iliotibial band stretch  1. Lie on your side with your left / right leg in the top position. 2. Bend your left / right knee and grab your ankle. 3. Slowly bring your knee back so your thigh is behind your body. 4. Slowly lower your knee toward the floor until you feel a gentle stretch on the outside of your left / right thigh. If you do not feel a stretch and your knee will not fall farther, place the heel of your other foot on top of your outer knee and pull your thigh down farther. 5. Hold this position for __________ seconds. 6. Slowly return to the starting position. Repeat __________ times. Complete this exercise __________ times a day. Strengthening exercises These exercises build strength and endurance in your hip and pelvis. Endurance is the ability to use your muscles for a long time, even after they get tired. Exercise B: Bridge ( hip extensors) 1. Lie on your back on a firm surface with your knees bent and your feet flat on the floor. 2. Tighten your buttocks muscles and lift your buttocks off the floor until your trunk is level with your thighs. You should feel the muscles working in your buttocks and the back of your thighs. If this exercise is too easy, try doing it with your arms crossed over your chest. 3. Hold this position for __________ seconds. 4. Slowly return to the  starting position. 5. Let your muscles relax completely between repetitions. Repeat __________ times. Complete this exercise __________ times a day. Exercise C: Squats ( knee extensors and  quadriceps) 1. Stand in front of a table, with your feet and knees pointing straight ahead. You may rest your hands on the table for balance but not for support. 2. Slowly bend your knees and lower your hips like you are going to sit in a chair. ? Keep your weight over your heels, not over your toes. ? Keep your lower legs upright so they are parallel with the table legs. ? Do not let your hips go lower than your knees. ? Do not bend lower than told by your health care provider. ? If your hip pain increases, do not bend as low. 3. Hold this position for __________ seconds. 4. Slowly push with your legs to return to standing. Do not use your hands to pull yourself to standing. Repeat __________ times. Complete this exercise __________ times a day. Exercise D: Hip hike 1. Stand sideways on a bottom step. Stand on your left / right leg with your other foot unsupported next to the step. You can hold onto the railing or wall if needed for balance. 2. Keeping your knees straight and your torso square, lift your left / right hip up toward the ceiling. 3. Hold this position for __________ seconds. 4. Slowly let your left / right hip lower toward the floor, past the starting position. Your foot   should get closer to the floor. Do not lean or bend your knees. Repeat __________ times. Complete this exercise __________ times a day. Exercise E: Single leg stand 1. Stand near a counter or door frame that you can hold onto for balance as needed. It is helpful to stand in front of a mirror for this exercise so you can watch your hip. 2. Squeeze your left / right buttock muscles then lift up your other foot. Do not let your left / right hip push out to the side. 3. Hold this position for __________ seconds. Repeat  __________ times. Complete this exercise __________ times a day. This information is not intended to replace advice given to you by your health care provider. Make sure you discuss any questions you have with your health care provider. Document Released: 03/05/2004 Document Revised: 10/03/2015 Document Reviewed: 01/11/2015 Elsevier Interactive Patient Education  2018 [place] We placed an order today for your standing lab work.    Please come back and get your standing labs in April and every 3 months   We have open lab Monday through Friday from 8:30-11:30 AM and 1:30-4 PM at the office of Dr. Bo Merino.   The office is located at [address] [place], [place], [place] [address] No appointment is necessary.   Labs are drawn by [facility].  You may receive a bill from [facility] for your lab work. If you have any questions regarding directions or hours of operation,  please call [phone].

## 2017-04-30 ENCOUNTER — Telehealth: Payer: Self-pay | Admitting: Rheumatology

## 2017-04-30 NOTE — Telephone Encounter (Signed)
Contacted the patient and advised she should contact her PCP or urgent care for Tamiflu. Patient advised to hold her MTX if she has the Flu.

## 2017-04-30 NOTE — Telephone Encounter (Signed)
Per patient, she has been exposed to the FLU, and would like to get an rx for Tamaflu called into [facility] on [address]. Patient also schedule to take MTX today. Does she still take injection. Please call to advise.

## 2017-05-04 ENCOUNTER — Encounter: Payer: Self-pay | Admitting: Rheumatology

## 2017-05-04 ENCOUNTER — Encounter: Payer: Self-pay | Admitting: Internal Medicine

## 2017-05-05 ENCOUNTER — Other Ambulatory Visit: Payer: Self-pay | Admitting: Rheumatology

## 2017-05-05 DIAGNOSIS — M069 Rheumatoid arthritis, unspecified: Secondary | ICD-10-CM

## 2017-05-05 NOTE — Telephone Encounter (Signed)
Last Visit: 04/23/17 Next Visit: 09/29/17  Okay to refill per Dr. Estanislado Pandy

## 2017-05-11 ENCOUNTER — Other Ambulatory Visit: Payer: Self-pay

## 2017-05-12 ENCOUNTER — Inpatient Hospital Stay: Payer: BLUE CROSS/BLUE SHIELD | Attending: Hematology and Oncology

## 2017-05-12 DIAGNOSIS — D472 Monoclonal gammopathy: Secondary | ICD-10-CM | POA: Diagnosis not present

## 2017-05-12 DIAGNOSIS — M0600 Rheumatoid arthritis without rheumatoid factor, unspecified site: Secondary | ICD-10-CM | POA: Insufficient documentation

## 2017-05-12 LAB — CMP (CANCER CENTER ONLY)
ALT: 8 U/L (ref 0–55)
AST: 15 U/L (ref 5–34)
Albumin: 3.8 g/dL (ref 3.5–5.0)
Alkaline Phosphatase: 68 U/L (ref 40–150)
Anion gap: 6 (ref 3–11)
BUN: 10 mg/dL (ref 7–26)
CALCIUM: 9.9 mg/dL (ref 8.4–10.4)
CHLORIDE: 107 mmol/L (ref 98–109)
CO2: 27 mmol/L (ref 22–29)
Creatinine: 0.79 mg/dL (ref 0.60–1.10)
Glucose, Bld: 82 mg/dL (ref 70–140)
Potassium: 4.4 mmol/L (ref 3.5–5.1)
Sodium: 140 mmol/L (ref 136–145)
Total Bilirubin: 0.2 mg/dL (ref 0.2–1.2)
Total Protein: 7.3 g/dL (ref 6.4–8.3)

## 2017-05-12 LAB — CBC WITH DIFFERENTIAL (CANCER CENTER ONLY)
Basophils Absolute: 0 10*3/uL (ref 0.0–0.1)
Basophils Relative: 0 %
EOS ABS: 0.1 10*3/uL (ref 0.0–0.5)
EOS PCT: 3 %
HCT: 39.8 % (ref 34.8–46.6)
HEMOGLOBIN: 12.9 g/dL (ref 11.6–15.9)
LYMPHS ABS: 2.1 10*3/uL (ref 0.9–3.3)
Lymphocytes Relative: 47 %
MCH: 32.7 pg (ref 25.1–34.0)
MCHC: 32.4 g/dL (ref 31.5–36.0)
MCV: 101 fL (ref 79.5–101.0)
Monocytes Absolute: 0.3 10*3/uL (ref 0.1–0.9)
Monocytes Relative: 7 %
NEUTROS PCT: 43 %
Neutro Abs: 1.9 10*3/uL (ref 1.5–6.5)
PLATELETS: 266 10*3/uL (ref 145–400)
RBC: 3.94 MIL/uL (ref 3.70–5.45)
RDW: 15.3 % — ABNORMAL HIGH (ref 11.2–14.5)
WBC: 4.4 10*3/uL (ref 3.9–10.3)

## 2017-05-12 LAB — LACTATE DEHYDROGENASE: LDH: 212 U/L (ref 125–245)

## 2017-05-12 NOTE — Telephone Encounter (Signed)
We do not have her bone density results from 2018 which were done in [place].  We will need those results before infusing her with Reclast.  Please advise patient to request those records results.

## 2017-05-13 LAB — BETA 2 MICROGLOBULIN, SERUM: Beta-2 Microglobulin: 1.4 mg/L (ref 0.6–2.4)

## 2017-05-13 LAB — KAPPA/LAMBDA LIGHT CHAINS
Kappa free light chain: 13.6 mg/L (ref 3.3–19.4)
Kappa, lambda light chain ratio: 1.39 (ref 0.26–1.65)
LAMDA FREE LIGHT CHAINS: 9.8 mg/L (ref 5.7–26.3)

## 2017-05-17 LAB — MULTIPLE MYELOMA PANEL, SERUM
ALBUMIN SERPL ELPH-MCNC: 3.5 g/dL (ref 2.9–4.4)
Albumin/Glob SerPl: 1.1 (ref 0.7–1.7)
Alpha 1: 0.2 g/dL (ref 0.0–0.4)
Alpha2 Glob SerPl Elph-Mcnc: 0.7 g/dL (ref 0.4–1.0)
B-Globulin SerPl Elph-Mcnc: 1.2 g/dL (ref 0.7–1.3)
Gamma Glob SerPl Elph-Mcnc: 1 g/dL (ref 0.4–1.8)
Globulin, Total: 3.2 g/dL (ref 2.2–3.9)
IGG (IMMUNOGLOBIN G), SERUM: 1109 mg/dL (ref 700–1600)
IGM (IMMUNOGLOBULIN M), SRM: 41 mg/dL (ref 26–217)
IgA: 243 mg/dL (ref 87–352)
TOTAL PROTEIN ELP: 6.7 g/dL (ref 6.0–8.5)

## 2017-05-19 ENCOUNTER — Encounter: Payer: Self-pay | Admitting: Hematology and Oncology

## 2017-05-19 ENCOUNTER — Inpatient Hospital Stay (HOSPITAL_BASED_OUTPATIENT_CLINIC_OR_DEPARTMENT_OTHER): Payer: BLUE CROSS/BLUE SHIELD | Admitting: Hematology and Oncology

## 2017-05-19 ENCOUNTER — Telehealth: Payer: Self-pay

## 2017-05-19 VITALS — BP 129/79 | HR 86 | Temp 98.4°F | Resp 18 | Ht 66.0 in | Wt 133.5 lb

## 2017-05-19 DIAGNOSIS — D472 Monoclonal gammopathy: Secondary | ICD-10-CM | POA: Diagnosis not present

## 2017-05-19 DIAGNOSIS — M0600 Rheumatoid arthritis without rheumatoid factor, unspecified site: Secondary | ICD-10-CM | POA: Diagnosis not present

## 2017-05-19 NOTE — Telephone Encounter (Signed)
Printed avs and calender of upcoming appointment. Per 4/10 los 

## 2017-05-31 NOTE — Progress Notes (Signed)
[facility] Cancer Follow-up Visit:  Assessment: Monoclonal gammopathy 62 y.o. 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 The monoclonal protein may represent antibodies associated with arthritis or may represent development of a plasma cell disorder developing in the context of autoimmune condition.   Repeat lab work obtained prior to this clinic visit demonstrates no evidence of progression of monoclonal gammopathy, no significant hematological changes, or progressive renal or progressive renal dysfunction.  Plan: --Continue observation/monitoring --RTC 3 months with labs as outlined below. On biochemical progression, we will repeat imaging and may consider obtaining bone marrow biopsy  Voice recognition software was used and creation of this note. Despite my best effort at editing the text, some misspelling/errors may have occurred.  Orders Placed This Encounter  Procedures  . Beta 2 microglobulin, serum    Standing Status:   Future    Standing Expiration Date:   05/20/2018  . CBC with Differential (Cancer Center Only)    Standing Status:   Future    Standing Expiration Date:   05/20/2018  . CMP (Cancer Center only)    Standing Status:   Future    Standing Expiration Date:   05/20/2018  . Kappa/lambda light chains    Standing Status:   Future    Standing Expiration Date:   05/20/2018  . Lactate dehydrogenase    Standing Status:   Future    Standing Expiration Date:   05/20/2018  . SPEP & IFE with QIG    Standing Status:   Future    Standing Expiration Date:   05/20/2018    Cancer Staging No matching staging information was found for the patient.  All questions were answered.  . The patient knows to call the clinic with any problems, questions or concerns.  This note was electronically signed.    History of Presenting Illness Vickie Pearson is a 62 y.o. female followed in  the Cancer Center for diagnosis of monoclonal gammopathy of unknown significance (MGUS). Patient's current medical history is dominated by significantly uncomfortable seronegative rheumatoid arthritis. Patient is currently receiving methotrexate. In the past, hematuria initiation was considered, but patient refused that. The present time. Patient denies any fevers, chills, night sweats. Patient reports feeling reasonably well. She does complain off some pain in the left neck with some radiation down into the left upper extremity. Currently, denies having significant joint pain. In the past, did have cortisone injections in the knees was significant relief. Denies any respiratory, gastrointestinal, into urinary, or dermatological complaints.  Patient returns to the clinic for hematological monitoring.  Patient reports no significant symptoms since the last visit to the clinic.  Arthritic pains appear to be well controlled on current methotrexate therapy.  Reports sinus congestion without epistaxis or purulent drainage which she attributes to seasonal allergies.  Oncological/hematological History: --Labs, 10/02/16: tProt 6.5, Alb 3.9, Ca 9.4, Cr 0.7, AP 53;         SPEP/SIFE -- monoclonal IgA kappa;   IgG 1167, IgA 233, IgM 39;                                                         WBC 4.2, Hgb 12.6, Plt 314; ESR 7, ANA & RF -- negative --Labs, 11/12/16: tProt 7.0, Alb 3.7, Ca   9.6, Cr 0.9, AP 61;         SPEP/SIFE -- M-spike 0.2g/dL, IgA kappa;                                                           kappa 10.1, lambda 9.6, KLR 1.05; WBC 6.9, Hgb 13.1, Plt 260;  --Skeletal Survey, 11/24/16: no evidence of skeletal lytic lesions. --Labs, 02/11/17: tProt 7.0, Alb 3.7, Ca 9.3, Cr 0.8, AP 63, LDH 173;     SPEP/SIFE -- M-spike 0.2g/dL, IgA kappa;  IgG 1050, IgA 229, IgM 39; kappa 10.6, lambda 8.5, KLR 1.25; WBC 4.5, Hgb 12.7, Plt 239; --Labs, 05/12/17: tProt 7.3, Alb 3.8, Ca 9.4, Cr 0.8, AP 68, LDH 212, beta-2  microglobulin 1.4;  SPEP/SIFE -- No detectable M-spike, +IgA kappa;  IgG 1109, IgA 243, IgM 41; kappa 13.6, lambda 9.8, KLR 1.39; WBC 4.4, Hgb 12.9, Plt 266  Medical History: Past Medical History:  Diagnosis Date  . Colon polyps   . Costochondritis   . DDD (degenerative disc disease), cervical   . Degenerative arthritis of knee    Bilateral  . Degenerative disc disease, cervical   . Degenerative disc disease, cervical   . Fibromyalgia   . Hyperlipidemia   . Monoclonal gammopathy   . Multiple thyroid nodules   . Rheumatoid arthritis (HCC)     Surgical History: History reviewed. No pertinent surgical history.  Family History: Family History  Problem Relation Age of Onset  . Hypertension Mother   . Colon polyps Mother   . Stroke Mother   . Arthritis Mother   . Hypertension Father   . Heart disease Father   . Arthritis Father   . Kidney disease Father   . Hypertension Sister   . Hyperlipidemia Sister   . Hypertension Brother   . Hyperlipidemia Brother     Social History: Social History   Socioeconomic History  . Marital status: Widowed    Spouse name: Not on file  . Number of children: Not on file  . Years of education: Not on file  . Highest education level: Not on file  Occupational History  . Not on file  Social Needs  . Financial resource strain: Not on file  . Food insecurity:    Worry: Not on file    Inability: Not on file  . Transportation needs:    Medical: Not on file    Non-medical: Not on file  Tobacco Use  . Smoking status: Former Smoker    Packs/day: 1.00    Types: Cigarettes    Last attempt to quit: 07/24/1988    Years since quitting: 28.8  . Smokeless tobacco: Never Used  Substance and Sexual Activity  . Alcohol use: Yes    Comment: occasionally  . Drug use: No  . Sexual activity: Not on file  Lifestyle  . Physical activity:    Days per week: Not on file    Minutes per session: Not on file  . Stress: Not on file  Relationships  .  Social connections:    Talks on phone: Not on file    Gets together: Not on file    Attends religious service: Not on file    Active member of club or organization: Not on file    Attends meetings of clubs or   organizations: Not on file    Relationship status: Not on file  . Intimate partner violence:    Fear of current or ex partner: Not on file    Emotionally abused: Not on file    Physically abused: Not on file    Forced sexual activity: Not on file  Other Topics Concern  . Not on file  Social History Narrative  . Not on file    Allergies: Allergies  Allergen Reactions  . Penicillins Hives  . Piroxicam Hives  . Sulfa Antibiotics Hives    Medications:  Current Outpatient Medications  Medication Sig Dispense Refill  . cholecalciferol (VITAMIN D) 1000 units tablet Take 3,000 Units by mouth daily.    . folic acid (FOLVITE) 1 MG tablet Take 2 tablets (2 mg total) by mouth daily. 180 tablet 3  . methotrexate 50 MG/2ML injection INJECT 0.8 MLS (20 MG TOTAL) INTO THE SKIN ONCE A WEEK. 10 mL 0  . pravastatin (PRAVACHOL) 20 MG tablet Take one tab every other day. 60 tablet 3  . Tuberculin-Allergy Syringes (B-D ALLERGY SYRINGE 1CC/28G) 28G X 1/2" 1 ML MISC PATIENT TO INJECT METHOTREXATE ONCE WEEKLY. 12 each 3   No current facility-administered medications for this visit.     Review of Systems: Review of Systems  Constitutional: Negative for diaphoresis and fever.  Musculoskeletal: Positive for neck pain.  All other systems reviewed and are negative.    PHYSICAL EXAMINATION Blood pressure 129/79, pulse 86, temperature 98.4 F (36.9 C), temperature source Oral, resp. rate 18, height 5' 6" (1.676 m), weight 133 lb 8 oz (60.6 kg), SpO2 100 %.  ECOG PERFORMANCE STATUS: 1 - Symptomatic but completely ambulatory  Physical Exam  Constitutional: She is oriented to person, place, and time and well-developed, well-nourished, and in no distress.  HENT:  Head: Normocephalic and  atraumatic.  Mouth/Throat: Oropharynx is clear and moist. No oropharyngeal exudate.  Eyes: Pupils are equal, round, and reactive to light. Conjunctivae are normal.  Neck: No thyromegaly present.  Cardiovascular: Normal rate, regular rhythm, normal heart sounds and intact distal pulses.  No murmur heard. Pulmonary/Chest: Effort normal and breath sounds normal. No respiratory distress. She has no wheezes.  Abdominal: Soft. Bowel sounds are normal. She exhibits no distension and no mass. There is no tenderness. There is no rebound and no guarding.  Musculoskeletal: Normal range of motion. She exhibits no edema or tenderness.  Lymphadenopathy:    She has no cervical adenopathy.  Neurological: She is alert and oriented to person, place, and time. She has normal reflexes. No cranial nerve deficit.  Skin: Skin is warm and dry. No rash noted. She is not diaphoretic. No erythema.     LABORATORY DATA: I have personally reviewed the data as listed: No visits with results within 1 Week(s) from this visit.  Latest known visit with results is:  Appointment on 05/12/2017  Component Date Value Ref Range Status  . Kappa free light chain 05/12/2017 13.6  3.3 - 19.4 mg/L Final  . Lamda free light chains 05/12/2017 9.8  5.7 - 26.3 mg/L Final  . Kappa, lamda light chain ratio 05/12/2017 1.39  0.26 - 1.65 Final   Comment: (NOTE) Performed At: [facility] [address] [place], [place] [id number] Nagendra Sanjai MD Ph:[id number] Performed at [facility], [address] [place], [place] [address]   . IgG (Immunoglobin G), Serum 05/12/2017 1,109  700 - 1,600 mg/dL Final  . IgA 05/12/2017 243  87 - 352 mg/dL   Final  . IgM (Immunoglobulin M), Srm 05/12/2017 41  26 - 217 mg/dL Final  . Total Protein ELP 05/12/2017 6.7  6.0 - 8.5 g/dL Corrected  . Albumin SerPl Elph-Mcnc 05/12/2017 3.5  2.9 - 4.4 g/dL Corrected  . Alpha 1 05/12/2017 0.2  0.0 - 0.4 g/dL Corrected  . Alpha2  Glob SerPl Elph-Mcnc 05/12/2017 0.7  0.4 - 1.0 g/dL Corrected  . B-Globulin SerPl Elph-Mcnc 05/12/2017 1.2  0.7 - 1.3 g/dL Corrected  . Gamma Glob SerPl Elph-Mcnc 05/12/2017 1.0  0.4 - 1.8 g/dL Corrected  . M Protein SerPl Elph-Mcnc 05/12/2017 Not Observed  Not Observed g/dL Corrected  . Globulin, Total 05/12/2017 3.2  2.2 - 3.9 g/dL Corrected  . Albumin/Glob SerPl 05/12/2017 1.1  0.7 - 1.7 Corrected  . IFE 1 05/12/2017 Comment   Corrected   Comment: (NOTE) Immunofixation shows IgA monoclonal protein with kappa light chain specificity.   . Please Note 05/12/2017 Comment   Corrected   Comment: (NOTE) Protein electrophoresis scan will follow via computer, mail, or courier delivery. Performed At: [facility] [place], [place] [id number] Rush Farmer MD [id number] Performed at [facility], [place] [address], [place], [place] [address]   . Beta-2 Microglobulin 05/12/2017 1.4  0.6 - 2.4 mg/L Final   Comment: (NOTE) Siemens Immulite 2000 Immunochemiluminometric assay (ICMA) Values obtained with different assay methods or kits cannot be used interchangeably. Results cannot be interpreted as absolute evidence of the presence or absence of malignant disease. Performed At: [facility] [place], [place] [id number] Rush Farmer MD [id number] Performed at [facility], [place] [address], [place], [place] [address]   . LDH 05/12/2017 212  125 - 245 U/L Final   Performed at [facility], [place] [address], [place], [place] [address]  . Sodium 05/12/2017 140  136 - 145 mmol/L Final  . Potassium 05/12/2017 4.4  3.5 - 5.1 mmol/L Final  . Chloride 05/12/2017 107  98 - 109 mmol/L Final  . CO2 05/12/2017 27  22 - 29 mmol/L Final  . Glucose, Bld 05/12/2017 82  70 - 140 mg/dL Final  . BUN 05/12/2017 10  7 - 26 mg/dL Final  . Creatinine 05/12/2017 0.79  0.60 - 1.10 mg/dL Final   . Calcium 05/12/2017 9.9  8.4 - 10.4 mg/dL Final  . Total Protein 05/12/2017 7.3  6.4 - 8.3 g/dL Final  . Albumin 05/12/2017 3.8  3.5 - 5.0 g/dL Final  . AST 05/12/2017 15  5 - 34 U/L Final  . ALT 05/12/2017 8  0 - 55 U/L Final  . Alkaline Phosphatase 05/12/2017 68  40 - 150 U/L Final  . Total Bilirubin 05/12/2017 0.2  0.2 - 1.2 mg/dL Final  . GFR, Est Non Af Am 05/12/2017 >60  >60 mL/min Final  . GFR, Est AFR Am 05/12/2017 >60  >60 mL/min Final   Comment: (NOTE) The eGFR has been calculated using the CKD EPI equation. This calculation has not been validated in all clinical situations. eGFR's persistently <60 mL/min signify possible Chronic Kidney Disease.   Georgiann Hahn gap 05/12/2017 6  3 - 11 Final   Performed at [facility], [place] [address] [place], [place] [address]  . WBC Count 05/12/2017 4.4  3.9 - 10.3 K/uL Final  . RBC 05/12/2017 3.94  3.70 - 5.45 MIL/uL Final  . Hemoglobin 05/12/2017 12.9  11.6 - 15.9 g/dL Final  . HCT 05/12/2017 39.8  34.8 - 46.6 % Final  . MCV 05/12/2017 101.0  79.5 - 101.0 fL Final  . MCH 05/12/2017 32.7  25.1 - 34.0 pg Final  . MCHC 05/12/2017 32.4  31.5 - 36.0 g/dL Final  . RDW 05/12/2017 15.3* 11.2 - 14.5 % Final  . Platelet Count 05/12/2017 266  145 - 400 K/uL Final  . Neutrophils Relative % 05/12/2017 43  % Final  . Neutro Abs 05/12/2017 1.9  1.5 - 6.5 K/uL Final  . Lymphocytes Relative 05/12/2017 47  % Final  . Lymphs Abs 05/12/2017 2.1  0.9 - 3.3 K/uL Final  . Monocytes Relative 05/12/2017 7  % Final  . Monocytes Absolute 05/12/2017 0.3  0.1 - 0.9 K/uL Final  . Eosinophils Relative 05/12/2017 3  % Final  . Eosinophils Absolute 05/12/2017 0.1  0.0 - 0.5 K/uL Final  . Basophils Relative 05/12/2017 0  % Final  . Basophils Absolute 05/12/2017 0.0  0.0 - 0.1 K/uL Final   Performed at [facility], [place] [address], [place], [place] [address]       Ardath Sax, MD

## 2017-05-31 NOTE — Assessment & Plan Note (Signed)
62 y.o. 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 The monoclonal protein may represent antibodies associated with arthritis or may represent development of a plasma cell disorder developing in the context of autoimmune condition.   Repeat lab work obtained prior to this clinic visit demonstrates no evidence of progression of monoclonal gammopathy, no significant hematological changes, or progressive renal or progressive renal dysfunction.  Plan: --Continue observation/monitoring --RTC 3 months with labs as outlined below. On biochemical progression, we will repeat imaging and may consider obtaining bone marrow biopsy 

## 2017-06-02 ENCOUNTER — Other Ambulatory Visit: Payer: Self-pay | Admitting: Rheumatology

## 2017-06-02 DIAGNOSIS — Z79899 Other long term (current) drug therapy: Secondary | ICD-10-CM

## 2017-06-02 NOTE — Telephone Encounter (Signed)
Last Visit: 04/23/17 Next visit: 09/29/17 Labs: 05/12/17 stable  Okay to refill per Dr. Deveshwar  

## 2017-07-10 ENCOUNTER — Other Ambulatory Visit: Payer: Self-pay

## 2017-07-10 ENCOUNTER — Encounter (HOSPITAL_COMMUNITY): Payer: Self-pay

## 2017-07-10 ENCOUNTER — Emergency Department (HOSPITAL_COMMUNITY): Payer: BLUE CROSS/BLUE SHIELD

## 2017-07-10 ENCOUNTER — Emergency Department (HOSPITAL_COMMUNITY)
Admission: EM | Admit: 2017-07-10 | Discharge: 2017-07-10 | Disposition: A | Discharge status: Home / Self Care | Payer: BLUE CROSS/BLUE SHIELD | Attending: Emergency Medicine | Admitting: Emergency Medicine

## 2017-07-10 DIAGNOSIS — R42 Dizziness and giddiness: Secondary | ICD-10-CM | POA: Diagnosis not present

## 2017-07-10 DIAGNOSIS — R002 Palpitations: Secondary | ICD-10-CM | POA: Insufficient documentation

## 2017-07-10 DIAGNOSIS — Z79899 Other long term (current) drug therapy: Secondary | ICD-10-CM | POA: Diagnosis not present

## 2017-07-10 DIAGNOSIS — Z87891 Personal history of nicotine dependence: Secondary | ICD-10-CM | POA: Diagnosis not present

## 2017-07-10 DIAGNOSIS — R072 Precordial pain: Secondary | ICD-10-CM | POA: Diagnosis present

## 2017-07-10 LAB — BASIC METABOLIC PANEL
ANION GAP: 11 (ref 5–15)
BUN: 12 mg/dL (ref 6–20)
CO2: 25 mmol/L (ref 22–32)
Calcium: 10 mg/dL (ref 8.9–10.3)
Chloride: 106 mmol/L (ref 101–111)
Creatinine, Ser: 0.85 mg/dL (ref 0.44–1.00)
GFR calc Af Amer: >60 mL/min (ref >60)
GLUCOSE: 133 mg/dL — AB (ref 65–99)
POTASSIUM: 3.7 mmol/L (ref 3.5–5.1)
Sodium: 142 mmol/L (ref 135–145)

## 2017-07-10 LAB — CBC
HEMATOCRIT: 42.9 % (ref 36.0–46.0)
HEMOGLOBIN: 14.2 g/dL (ref 12.0–15.0)
MCH: 32.9 pg (ref 26.0–34.0)
MCHC: 33.1 g/dL (ref 30.0–36.0)
MCV: 99.5 fL (ref 78.0–100.0)
Platelets: 285 10*3/uL (ref 150–400)
RBC: 4.31 MIL/uL (ref 3.87–5.11)
RDW: 15 % (ref 11.5–15.5)
WBC: 4.4 10*3/uL (ref 4.0–10.5)

## 2017-07-10 LAB — I-STAT TROPONIN, ED
Troponin i, poc: 0 ng/mL (ref 0.00–0.08)
Troponin i, poc: 0 ng/mL (ref 0.00–0.08)

## 2017-07-10 NOTE — Discharge Instructions (Signed)
Please read and follow all provided instructions.  Your diagnoses today include:  1. Precordial pain     Tests performed today include:  An EKG of your heart  A chest x-ray  Cardiac enzymes - a blood test for heart muscle damage  Blood counts and electrolytes  Vital signs. See below for your results today.   Medications prescribed:   None  Take any prescribed medications only as directed.  Follow-up instructions: Please follow-up with your primary care provider as soon as you can for further evaluation of your symptoms.   Return instructions:  SEEK IMMEDIATE MEDICAL ATTENTION IF:  You have severe chest pain, especially if the pain is crushing or pressure-like and spreads to the arms, back, neck, or jaw, or if you have sweating, nausea (feeling sick to your stomach), or shortness of breath. THIS IS AN EMERGENCY. Don't wait to see if the pain will go away. Get medical help at once. Call 911 or 0 (operator). DO NOT drive yourself to the hospital.   Your chest pain gets worse and does not go away with rest.   You have an attack of chest pain lasting longer than usual, despite rest and treatment with the medications your caregiver has prescribed.   You wake from sleep with chest pain or shortness of breath.  You feel dizzy or faint.  You have chest pain not typical of your usual pain for which you originally saw your caregiver.   You have any other emergent concerns regarding your health.  Additional Information: Chest pain comes from many different causes. Your caregiver has diagnosed you as having chest pain that is not specific for one problem, but does not require admission.  You are at low risk for an acute heart condition or other serious illness.   Your vital signs today were: BP 122/60    Pulse 67    Temp 98.1 F (36.7 C) (Oral)    Resp 14    SpO2 100%  If your blood pressure (BP) was elevated above 135/85 this visit, please have this repeated by your doctor  within one month. --------------

## 2017-07-10 NOTE — ED Provider Notes (Signed)
[facility] DEPT Provider Note   CSN: [id number] Arrival date & time: 07/10/17  1136     History   Chief Complaint Chief Complaint  Patient presents with  . Chest Pain    HPI Vickie Pearson is a 62 y.o. female.  Patient with history of costochondritis, GERD, rheumatoid arthritis, high cholesterol -- presents to the emergency department today with complaint of intermittent chest pains and discomfort ongoing for the past 4 days.  She reports having chest discomfort that is intermittent, worse with certain positions and movements, worse with bending over and palpation to the ribs underneath her left breast.  She does not have associated diaphoresis, vomiting.  Symptoms are not exertional.  They can come on at rest.  They are not related to eating.  She did state the other day she took an antacid tablet and felt better temporarily.  She reports feeling very lightheaded today and had palpitations -- so she presented to make sure she wasn't having a heart attack.  She denies any abdominal pain, vomiting, diarrhea.  No urinary symptoms. Patient denies risk factors for pulmonary embolism including: unilateral leg swelling, history of DVT/PE/other blood clots, use of exogenous hormones, recent immobilizations, recent surgery, recent travel (>4hr segment), malignancy (has monoclonal gammopathy that is being monitored), hemoptysis.     Past Medical History:  Diagnosis Date  . Colon polyps   . Costochondritis   . DDD (degenerative disc disease), cervical   . Degenerative arthritis of knee    Bilateral  . Degenerative disc disease, cervical   . Degenerative disc disease, cervical   . Fibromyalgia   . Hyperlipidemia   . Monoclonal gammopathy   . Multiple thyroid nodules   . Rheumatoid arthritis [facility])     Patient Active Problem List   Diagnosis Date Noted  . Other chest pain 03/01/2017  . Rheumatoid arthritis involving multiple sites ([place]) 07/24/2016    . Cervical radiculopathy 07/24/2016  . Hyperlipidemia 07/24/2016  . Osteoporosis 07/24/2016  . Vitamin D deficiency 07/24/2016  . Multiple thyroid nodules 07/24/2016  . Monoclonal gammopathy 07/24/2016  . Colon polyps 07/24/2016  . 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 Hx of abnormal cervical Pap smear 07/24/2016  . OA (osteoarthritis) of knee 07/24/2016    No past surgical history on file.   OB History   None      Home Medications    Prior to Admission medications   Medication Sig Start Date End Date Taking? Authorizing Provider  cholecalciferol (VITAMIN D) 1000 units tablet Take 3,000 Units by mouth daily.    [provider]  folic acid (FOLVITE) 1 MG tablet Take 2 tablets (2 mg total) by mouth daily. 05/05/17   Bo Merino, MD  methotrexate 50 MG/2ML injection INJECT 0.8 MLS (20 MG TOTAL) INTO THE SKIN ONCE A WEEK. 06/02/17   Bo Merino, MD  pravastatin (PRAVACHOL) 20 MG tablet Take one tab every other day. 07/24/16   Ladell Pier, MD  Tuberculin-Allergy Syringes (B-D ALLERGY SYRINGE 1CC/28G) 28G X 1/2" 1 ML MISC PATIENT TO INJECT METHOTREXATE ONCE WEEKLY. 04/20/17   Bo Merino, MD    Family History Family History  Problem Relation Age of Onset  . Hypertension Mother   . Colon polyps Mother   . Stroke Mother   . Arthritis Mother   . Hypertension Father   . Heart disease Father   . Arthritis Father   . Kidney disease Father   . Hypertension Sister   . Hyperlipidemia  Sister   . Hypertension Brother   . Hyperlipidemia Brother     Social History Social History   Tobacco Use  . Smoking status: Former Smoker    Packs/day: 1.00    Types: Cigarettes    Last attempt to quit: 07/24/1988    Years since quitting: 28.9  . Smokeless tobacco: Never Used  Substance Use Topics  . Alcohol use: Yes    Comment: occasionally  . Drug use: No     Allergies   Penicillins; Piroxicam; and Sulfa antibiotics   Review of Systems Review  of Systems  Constitutional: Negative for diaphoresis and fever.  Eyes: Negative for redness.  Respiratory: Negative for cough and shortness of breath.   Cardiovascular: Positive for chest pain and palpitations. Negative for leg swelling.  Gastrointestinal: Negative for abdominal pain, nausea and vomiting.  Genitourinary: Negative for dysuria.  Musculoskeletal: Negative for back pain and neck pain.  Skin: Negative for rash.  Neurological: Positive for light-headedness. Negative for syncope.  Psychiatric/Behavioral: The patient is not nervous/anxious.      Physical Exam Updated Vital Signs BP 127/79   Pulse 100   Temp 98.1 F (36.7 C) (Oral)   Resp 18   SpO2 97%   Physical Exam  Constitutional: She appears well-developed and well-nourished.  HENT:  Head: Normocephalic and atraumatic.  Mouth/Throat: Oropharynx is clear and moist and mucous membranes are normal. Mucous membranes are not dry.  Eyes: Conjunctivae are normal.  Neck: Trachea normal and normal range of motion. Neck supple. Normal carotid pulses and no JVD present. No muscular tenderness present. Carotid bruit is not present. No tracheal deviation present.  Cardiovascular: Normal rate, regular rhythm, S1 normal, S2 normal, normal heart sounds and intact distal pulses. Exam reveals no decreased pulses.  No murmur heard. Pulmonary/Chest: Effort normal. No respiratory distress. She has no wheezes. She exhibits tenderness (Pt reports reproducible pain with palpation under L breast).  Abdominal: Soft. Normal aorta and bowel sounds are normal. There is no tenderness. There is no rebound and no guarding.  Musculoskeletal: Normal range of motion. She exhibits no edema or tenderness.  Neurological: She is alert.  Skin: Skin is warm and dry. She is not diaphoretic. No cyanosis. No pallor.  Psychiatric: She has a normal mood and affect.  Nursing note and vitals reviewed.    ED Treatments / Results  Labs (all labs ordered are  listed, but only abnormal results are displayed) Labs Reviewed  BASIC METABOLIC PANEL - Abnormal; Notable for the following components:      Result Value   Glucose, Bld 133 (*)    All other components within normal limits  CBC  I-STAT TROPONIN, ED  I-STAT TROPONIN, ED    EKG EKG Interpretation  Date/Time:  Saturday July 10 2017 15:48:01 EDT Ventricular Rate:  67 PR Interval:    QRS Duration: 82 QT Interval:  399 QTC Calculation: 422 R Axis:   68 Text Interpretation:  Sinus rhythm No significant change since last tracing Confirmed by Lajean Saver [phone]) on 07/10/2017 4:12:43 PM   Radiology Dg Chest 2 View  Result Date: 07/10/2017 CLINICAL DATA:  Four-day history of LEFT-sided chest pain. Former smoker. EXAM: CHEST - 2 VIEW COMPARISON:  11/24/2016, 10/02/2016. FINDINGS: Cardiomediastinal silhouette unremarkable, unchanged. Lungs clear. Bronchovascular markings normal. Pulmonary vascularity normal. No visible pleural effusions. No pneumothorax. Thoracolumbar levoscoliosis as noted previously. IMPRESSION: No acute cardiopulmonary disease.  Stable examination. Electronically Signed   By: Evangeline Dakin M.D.   On: 07/10/2017 12:06  Procedures Procedures (including critical care time)  Medications Ordered in ED Medications - No data to display   Initial Impression / Assessment and Plan / ED Course  I have reviewed the triage vital signs and the nursing notes.  Pertinent labs & imaging results that were available during my care of the patient were reviewed by me and considered in my medical decision making (see chart for details).     Patient seen and examined. Work-up initiated. Medications ordered. EKG reviewed.   Vital signs reviewed and are as follows: BP 127/79   Pulse 100   Temp 98.1 F (36.7 C) (Oral)   Resp 18   SpO2 97%   Will check repeat EKG and troponin. Patient has low-risk story for CP, PE.   Patient discussed with and seen by Dr. Ashok Cordia.   Patient  was counseled to return with severe chest pain, especially if the pain is crushing or pressure-like and spreads to the arms, back, neck, or jaw, or if they have sweating, nausea, or shortness of breath with the pain. They were encouraged to call 911 with these symptoms.   They were also told to return if their chest pain gets worse and does not go away with rest, they have an attack of chest pain lasting longer than usual despite rest and treatment with the medications their caregiver has prescribed, if they wake from sleep with chest pain or shortness of breath, if they feel dizzy or faint, if they have chest pain not typical of their usual pain, or if they have any other emergent concerns regarding their health.  The patient verbalized understanding and agreed.    Final Clinical Impressions(s) / ED Diagnoses   Final diagnoses:  Precordial pain   Patient with chest tightness, atypical features. Feel patient is low risk for ACS given history (poor story for ACS/MI), negative troponin(s) x2, normal/unchanged EKGx2. CXR clear. No vital sign abnormalities.    ED Discharge Orders    None       Carlisle Cater, Hershal Coria 07/10/17 1701    Lajean Saver, MD 07/10/17 1911

## 2017-07-10 NOTE — ED Triage Notes (Signed)
She reports intermittent left-sided chest discomfort since this Tuesday, which is gradually increasing in both frequency and intensity. She cites hx of RA, also hx of costochondritis; and is currently on methotrexate therapy. Her skin is normal, warm and dry and she is breathing normally. She also reports a few minutes this morning of feeling "lightheaded". EKS/labs performed at triage.

## 2017-08-04 ENCOUNTER — Other Ambulatory Visit: Payer: Self-pay | Admitting: Rheumatology

## 2017-08-04 DIAGNOSIS — Z79899 Other long term (current) drug therapy: Secondary | ICD-10-CM

## 2017-08-04 NOTE — Telephone Encounter (Signed)
Last Visit: 04/23/17 Next visit: 09/29/17 Labs: 05/12/17 stable  Okay to refill per Dr. Estanislado Pandy

## 2017-09-27 NOTE — Progress Notes (Signed)
Office Visit Note  Patient: Vickie Pearson             Date of Birth: 03-May-1955           MRN: [id number]             PCP: Ladell Pier, MD Referring: Ladell Pier, MD Visit Date: 09/29/2017 Occupation: @GUAROCC @  Subjective:  Right knee pain   History of Present Illness: Khyleigh Furney is a 62 y.o. female with history of seronegative rheumatoid arthritis, iritis, and osteoporosis.  She is injecting MTX 0.8 ml sq once weekly and folic acid 2 mg daily.  Patient reports that she has been having increased discomfort in bilateral first MCP joints.  She states the left thumb pain started last week and lasted 2 to 3 days.  She iced her left thumb which helped with the swelling.  She states she had pain with range of motion.  She states she has been exercising more regularly and was lifting 2 pound weights which she feels may have caused her left thumb pain.  She denies any locking or triggering.  She states that she has been having increased right knee pain.  She denies any other joint pain or joint swelling.  She reports that she needs bilateral knee replacements.  She continues to have chronic lower back pain and occasionally has pain that radiates down the left leg.  She has been going to water aerobics twice a week and going to the gym 2 other days a week.  She reports that 2 to 3 months ago her left eye felt unusual and she followed up with her retina specialist.  She reports that the exam was normal and her symptoms resolved.  She has not had any recurrence of iritis.  Activities of Daily Living:  Patient reports morning stiffness for 20  minutes.   Patient Denies nocturnal pain.  Difficulty dressing/grooming: Denies Difficulty climbing stairs: Denies Difficulty getting out of chair: Denies Difficulty using hands for taps, buttons, cutlery, and/or writing: Denies  Review of Systems  Constitutional: Negative for fatigue.  HENT: Positive for mouth dryness.  Negative for mouth sores and nose dryness.   Eyes: Positive for dryness. Negative for pain and visual disturbance.  Respiratory: Negative for cough, hemoptysis, shortness of breath and difficulty breathing.   Cardiovascular: Negative for chest pain, palpitations, hypertension and swelling in legs/feet.  Gastrointestinal: Negative for blood in stool, constipation and diarrhea.  Endocrine: Negative for increased urination.  Genitourinary: Negative for painful urination.  Musculoskeletal: Positive for arthralgias, joint pain, joint swelling, morning stiffness and muscle tenderness. Negative for myalgias, muscle weakness and myalgias.  Skin: Negative for color change, pallor, rash, hair loss, nodules/bumps, skin tightness, ulcers and sensitivity to sunlight.  Allergic/Immunologic: Negative for susceptible to infections.  Neurological: Negative for dizziness, numbness, headaches and weakness.  Hematological: Negative for swollen glands.  Psychiatric/Behavioral: Positive for depressed mood and sleep disturbance. The patient is not nervous/anxious.     PMFS History:  Patient Active Problem List   Diagnosis Date Noted  . Other chest pain 03/01/2017  . Rheumatoid arthritis involving multiple sites ([place]) 07/24/2016  . Cervical radiculopathy 07/24/2016  . Hyperlipidemia 07/24/2016  . Osteoporosis 07/24/2016  . Vitamin D deficiency 07/24/2016  . Multiple thyroid nodules 07/24/2016  . Monoclonal gammopathy 07/24/2016  . Colon polyps 07/24/2016  . 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 Hx of abnormal cervical Pap smear 07/24/2016  . OA (osteoarthritis) of knee 07/24/2016    Past  Medical History:  Diagnosis Date  . Colon polyps   . Costochondritis   . DDD (degenerative disc disease), cervical   . Degenerative arthritis of knee    Bilateral  . Degenerative disc disease, cervical   . Degenerative disc disease, cervical   . Fibromyalgia   . Hyperlipidemia   . Monoclonal gammopathy   .  Multiple thyroid nodules   . Rheumatoid arthritis ([place])     Family History  Problem Relation Age of Onset  . Hypertension Mother   . Colon polyps Mother   . Stroke Mother   . Arthritis Mother   . Hypertension Father   . Heart disease Father   . Arthritis Father   . Kidney disease Father   . Hypertension Sister   . Hyperlipidemia Sister   . Hypertension Brother   . Hyperlipidemia Brother    History reviewed. No pertinent surgical history. Social History   Social History Narrative  . Not on file    Objective: Vital Signs: BP 126/76 (BP Location: Left Arm, Patient Position: Sitting, Cuff Size: Normal)   Pulse 73   Resp 12   Ht 5\' 6"  (1.676 m)   Wt 135 lb (61.2 kg)   BMI 21.79 kg/m    Physical Exam  Constitutional: She is oriented to person, place, and time. She appears well-developed and well-nourished.  HENT:  Head: Normocephalic and atraumatic.  Eyes: Conjunctivae and EOM are normal.  Neck: Normal range of motion.  Cardiovascular: Normal rate, regular rhythm, normal heart sounds and intact distal pulses.  Pulmonary/Chest: Effort normal and breath sounds normal.  Abdominal: Soft. Bowel sounds are normal.  Lymphadenopathy:    She has no cervical adenopathy.  Neurological: She is alert and oriented to person, place, and time.  Skin: Skin is warm and dry. Capillary refill takes less than 2 seconds.  Psychiatric: She has a normal mood and affect. Her behavior is normal.  Nursing note and vitals reviewed.    Musculoskeletal Exam: C-spine, thoracic spine, and lumbar spine good ROM. No midline spinal tenderness.  No SI joint tenderness.  Shoulder joints, elbow joints, wrist joints, MCPs, PIPs, and DIPs good ROM with no synovitis.  PIP and DIP synovial thickening consistent with osteoarthritis. Tenderness of left 1st MCP joint. Subluxation of left 2nd PIP joint.  Hip joints, knee joints, ankle joints, MTPs, PIPs, and DIPs good ROM with no synovitis.  No warmth or effusion of  knee joints.  No tenderness of trochanteric bursa bilaterally.    CDAI Exam: CDAI Score: 1.8  Patient Global Assessment: 4 (mm); Provider Global Assessment: 4 (mm) Swollen: 0 ; Tender: 1  Joint Exam      Right  Left  MCP 1      Tender     Investigation: No additional findings.  Imaging: No results found.  Recent Labs: Lab Results  Component Value Date   WBC 4.4 07/10/2017   HGB 14.2 07/10/2017   PLT 285 07/10/2017   NA 142 07/10/2017   K 3.7 07/10/2017   CL 106 07/10/2017   CO2 25 07/10/2017   GLUCOSE 133 (H) 07/10/2017   BUN 12 07/10/2017   CREATININE 0.85 07/10/2017   BILITOT 0.2 05/12/2017   ALKPHOS 68 05/12/2017   AST 15 05/12/2017   ALT 8 05/12/2017   PROT 7.3 05/12/2017   ALBUMIN 3.8 05/12/2017   CALCIUM 10.0 07/10/2017   GFRAA >60 07/10/2017    Speciality Comments: No specialty comments available.  Procedures:  Large Joint Inj: R knee on  09/29/2017 10:49 AM Indications: pain Details: 27 G 1.5 in needle, medial approach  Arthrogram: No  Medications: 1.5 mL lidocaine 1 %; 40 mg triamcinolone acetonide 40 MG/ML Aspirate: 0 mL Outcome: tolerated well, no immediate complications Procedure, treatment alternatives, risks and benefits explained, specific risks discussed. Consent was given by the patient. Immediately prior to procedure a time out was called to verify the correct patient, procedure, equipment, support staff and site/side marked as required. Patient was prepped and draped in the usual sterile fashion.     Allergies: Penicillins; Piroxicam; and Sulfa antibiotics   Assessment / Plan:     Visit Diagnoses: Rheumatoid arthritis of multiple sites with negative rheumatoid factor ([place]): She has no synovitis on exam.  She has tenderness of bilateral first MCP joints but no inflammation was noted.  She reports two minor RA flares in the past 1 year.  She had x-rays of both hands and feet on 10/02/17 that revealed findings consistent with rheumatoid  arthritis but no erosive changes were noted. She is clinically doing well on MTX 0.8 ml subcutaneous injections once weekly and folic acid 2 mg daily.  A refill of MTX was sent to the pharmacy.  She will continue on this current treatment regimen.  She was advised to notify us if she develops increased joint pain or joint swelling.  If she continues to have frequent mild flares, we will consider adding another medication to her current treatment regimen.  She was previously on PLQ but has a documented sulfa allergy. She would like to continue on MTX sq injections once weekly at this time. She will follow up in 5 months.   High risk medication use - Methotrexate 8 tablets by mouth every week, folic acid 2 mg by mouth daily.  CBC and CMP were ordered today to monitor for drug toxicity.  She will return in November and every 3 months for lab work.  - Plan: COMPLETE METABOLIC PANEL WITH GFR, CBC with Differential/Platelet, methotrexate 50 MG/2ML injection  Chronic iritis of both eyes: She denies any recent recurrences.  She continues to follow up with retina specialist regularly.   Primary osteoarthritis of both knees: She has chronic pain in both knee joints. She had a left knee cortisone injection on 04/23/17, which provided significant relief.  She has no left knee discomfort at this time.  She has been having increased right knee pain. A right knee cortisone injection was performed today. We will apply for visco supplementation knee joint injections.  She is aware she needs bilateral knee replacements but she would not like to proceed with surgery at this time.     Chronic pain of right knee: She has been having increased right knee pain.  No warmth or effusion was noted.  She has limited extension on exam.  She had x-rays of the right knee on 10/02/17 that revealed findings consistent with severe rheumatoid arthritis and severe chondromalacia patella.  She requested a right knee cortisone injection today.   She tolerated the procedure well. We will apply for visco gel injections for bilateral knee joints.   Trochanteric bursitis, left hip: She has no tenderness on exam.   DDD (degenerative disc disease), cervical: She has good ROM with no discomfort.  No symptoms of radiculopathy at this time.    Age-related osteoporosis without current pathological fracture: She takes a vitamin D supplement daily.   Vitamin D deficiency: She takes vitamin D 3,000 units by mouth daily.   Other medical conditions are listed  as follows:   Monoclonal gammopathy  Mixed hyperlipidemia  Multiple thyroid nodules  Orders: Orders Placed This Encounter  Procedures  . Large Joint Inj: R knee  . COMPLETE METABOLIC PANEL WITH GFR  . CBC with Differential/Platelet   Meds ordered this encounter  Medications  . methotrexate 50 MG/2ML injection    Sig: Inject 0.8 mLs (20 mg total) into the skin once a week.    Dispense:  10 mL    Refill:  0    DX Code Needed - M06.09    Face-to-face time spent with patient was 30 minutes. Greater than 50% of time was spent in counseling and coordination of care.  Follow-Up Instructions: Return in about 5 months (around 03/01/2018) for Rheumatoid arthritis, Osteoporosis.   Bo Merino, MD  Note - This record has been created using Editor, commissioning.  Chart creation errors have been sought, but may not always  have been located. Such creation errors do not reflect on  the standard of medical care.

## 2017-09-29 ENCOUNTER — Ambulatory Visit: Payer: BLUE CROSS/BLUE SHIELD | Admitting: Rheumatology

## 2017-09-29 ENCOUNTER — Encounter: Payer: Self-pay | Admitting: Rheumatology

## 2017-09-29 ENCOUNTER — Telehealth: Payer: Self-pay

## 2017-09-29 VITALS — BP 126/76 | HR 73 | Resp 12 | Ht 66.0 in | Wt 135.0 lb

## 2017-09-29 DIAGNOSIS — Z79899 Other long term (current) drug therapy: Secondary | ICD-10-CM

## 2017-09-29 DIAGNOSIS — M810 Age-related osteoporosis without current pathological fracture: Secondary | ICD-10-CM

## 2017-09-29 DIAGNOSIS — E782 Mixed hyperlipidemia: Secondary | ICD-10-CM

## 2017-09-29 DIAGNOSIS — M0609 Rheumatoid arthritis without rheumatoid factor, multiple sites: Secondary | ICD-10-CM | POA: Diagnosis not present

## 2017-09-29 DIAGNOSIS — M7062 Trochanteric bursitis, left hip: Secondary | ICD-10-CM

## 2017-09-29 DIAGNOSIS — M170 Bilateral primary osteoarthritis of knee: Secondary | ICD-10-CM

## 2017-09-29 DIAGNOSIS — H2013 Chronic iridocyclitis, bilateral: Secondary | ICD-10-CM

## 2017-09-29 DIAGNOSIS — M25561 Pain in right knee: Secondary | ICD-10-CM | POA: Diagnosis not present

## 2017-09-29 DIAGNOSIS — E042 Nontoxic multinodular goiter: Secondary | ICD-10-CM

## 2017-09-29 DIAGNOSIS — G8929 Other chronic pain: Secondary | ICD-10-CM

## 2017-09-29 DIAGNOSIS — D472 Monoclonal gammopathy: Secondary | ICD-10-CM

## 2017-09-29 DIAGNOSIS — M5030 Other cervical disc degeneration, unspecified cervical region: Secondary | ICD-10-CM

## 2017-09-29 DIAGNOSIS — E559 Vitamin D deficiency, unspecified: Secondary | ICD-10-CM

## 2017-09-29 LAB — CBC WITH DIFFERENTIAL/PLATELET
BASOS ABS: 19 {cells}/uL (ref 0–200)
Basophils Relative: 0.4 %
EOS PCT: 2.5 %
Eosinophils Absolute: 118 cells/uL (ref 15–500)
HEMATOCRIT: 40 % (ref 35.0–45.0)
Hemoglobin: 13.2 g/dL (ref 11.7–15.5)
LYMPHS ABS: 2430 {cells}/uL (ref 850–3900)
MCH: 32.4 pg (ref 27.0–33.0)
MCHC: 33 g/dL (ref 32.0–36.0)
MCV: 98.3 fL (ref 80.0–100.0)
MPV: 10.2 fL (ref 7.5–12.5)
Monocytes Relative: 6.6 %
NEUTROS PCT: 38.8 %
Neutro Abs: 1824 cells/uL (ref 1500–7800)
PLATELETS: 307 10*3/uL (ref 140–400)
RBC: 4.07 10*6/uL (ref 3.80–5.10)
RDW: 13.4 % (ref 11.0–15.0)
Total Lymphocyte: 51.7 %
WBC: 4.7 10*3/uL (ref 3.8–10.8)
WBCMIX: 310 {cells}/uL (ref 200–950)

## 2017-09-29 LAB — COMPLETE METABOLIC PANEL WITH GFR
AG Ratio: 1.5 (calc) (ref 1.0–2.5)
ALBUMIN MSPROF: 4.1 g/dL (ref 3.6–5.1)
ALKALINE PHOSPHATASE (APISO): 64 U/L (ref 33–130)
ALT: 10 U/L (ref 6–29)
AST: 16 U/L (ref 10–35)
BILIRUBIN TOTAL: 0.3 mg/dL (ref 0.2–1.2)
BUN: 14 mg/dL (ref 7–25)
CHLORIDE: 105 mmol/L (ref 98–110)
CO2: 28 mmol/L (ref 20–32)
Calcium: 9.6 mg/dL (ref 8.6–10.4)
Creat: 0.79 mg/dL (ref 0.50–0.99)
GFR, EST AFRICAN AMERICAN: 92 mL/min/{1.73_m2} (ref >=60)
GFR, Est Non African American: 80 mL/min/{1.73_m2} (ref >=60)
GLOBULIN: 2.8 g/dL (ref 1.9–3.7)
GLUCOSE: 73 mg/dL (ref 65–99)
Potassium: 4.9 mmol/L (ref 3.5–5.3)
SODIUM: 142 mmol/L (ref 135–146)
TOTAL PROTEIN: 6.9 g/dL (ref 6.1–8.1)

## 2017-09-29 MED ORDER — LIDOCAINE HCL 1 % IJ SOLN
1.5000 mL | INTRAMUSCULAR | Status: AC | PRN
  Administered 2017-09-29: 1.5 mL

## 2017-09-29 MED ORDER — TRIAMCINOLONE ACETONIDE 40 MG/ML IJ SUSP
40.0000 mg | INTRAMUSCULAR | Status: AC | PRN
  Administered 2017-09-29: 40 mg via INTRA_ARTICULAR

## 2017-09-29 MED ORDER — METHOTREXATE SODIUM CHEMO INJECTION 50 MG/2ML: 20.0000 mg | INTRAMUSCULAR | 0 refills | Status: DC

## 2017-09-29 NOTE — Patient Instructions (Signed)
Standing Labs We placed an order today for your standing lab work.    Please come back and get your standing labs in November and every 3 months   We have open lab Monday through Friday from 8:30-11:30 AM and 1:30-4:00 PM  at the office of Dr. Shaili Deveshwar.   You may experience shorter wait times on Monday and Friday afternoons. The office is located at [address], [address], [place], [place] [address] No appointment is necessary.   Labs are drawn by [facility].  You may receive a bill from [facility] for your lab work. If you have any questions regarding directions or hours of operation,  please call [phone].     

## 2017-09-29 NOTE — Telephone Encounter (Signed)
Please apply for bilateral visco, per Lovena Le. Thanks!

## 2017-09-30 NOTE — Progress Notes (Signed)
Labs are WNL.

## 2017-10-04 ENCOUNTER — Telehealth (INDEPENDENT_AMBULATORY_CARE_PROVIDER_SITE_OTHER): Payer: Self-pay

## 2017-10-04 NOTE — Telephone Encounter (Signed)
Noted  

## 2017-10-04 NOTE — Telephone Encounter (Signed)
Submitted VOB for Synvisc series, bilateral knee. 

## 2017-10-25 ENCOUNTER — Telehealth (INDEPENDENT_AMBULATORY_CARE_PROVIDER_SITE_OTHER): Payer: Self-pay

## 2017-10-25 NOTE — Telephone Encounter (Signed)
PA required for Synvisc Series, bilateral knee. Faxed completed PA form to [facility] at [phone]

## 2017-11-02 ENCOUNTER — Telehealth: Payer: Self-pay | Admitting: Hematology and Oncology

## 2017-11-02 NOTE — Telephone Encounter (Signed)
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 Left message for patient re appointments for 10/2 and 10/9. Schedule mailed.

## 2017-11-09 ENCOUNTER — Telehealth (INDEPENDENT_AMBULATORY_CARE_PROVIDER_SITE_OTHER): Payer: Self-pay

## 2017-11-09 NOTE — Telephone Encounter (Signed)
Please schedule patient appointment with Dr. Estanislado Pandy or Lovena Le for gel injection.  Patient is approved for Synvisc Series, bilateral knee. Ama Covered at 100% after co-pay $60.00 Co-pay PA Required PA Approval# [id number] Valid 10/25/2017- 10/25/2018

## 2017-11-10 ENCOUNTER — Inpatient Hospital Stay: Payer: BLUE CROSS/BLUE SHIELD | Attending: Hematology and Oncology

## 2017-11-10 DIAGNOSIS — D472 Monoclonal gammopathy: Secondary | ICD-10-CM | POA: Diagnosis present

## 2017-11-10 DIAGNOSIS — Z79899 Other long term (current) drug therapy: Secondary | ICD-10-CM | POA: Diagnosis not present

## 2017-11-10 LAB — CBC WITH DIFFERENTIAL (CANCER CENTER ONLY)
BASOS PCT: 0 %
Basophils Absolute: 0 10*3/uL (ref 0.0–0.1)
EOS ABS: 0.1 10*3/uL (ref 0.0–0.5)
EOS PCT: 1 %
HCT: 38.7 % (ref 34.8–46.6)
HEMOGLOBIN: 12.5 g/dL (ref 11.6–15.9)
Lymphocytes Relative: 48 %
Lymphs Abs: 2 10*3/uL (ref 0.9–3.3)
MCH: 32.2 pg (ref 25.1–34.0)
MCHC: 32.3 g/dL (ref 31.5–36.0)
MCV: 99.7 fL (ref 79.5–101.0)
Monocytes Absolute: 0.2 10*3/uL (ref 0.1–0.9)
Monocytes Relative: 5 %
NEUTROS PCT: 46 %
Neutro Abs: 1.9 10*3/uL (ref 1.5–6.5)
PLATELETS: 270 10*3/uL (ref 145–400)
RBC: 3.88 MIL/uL (ref 3.70–5.45)
RDW: 15.8 % — ABNORMAL HIGH (ref 11.2–14.5)
WBC: 4.2 10*3/uL (ref 3.9–10.3)

## 2017-11-10 LAB — CMP (CANCER CENTER ONLY)
ALK PHOS: 57 U/L (ref 38–126)
ALT: 9 U/L (ref 0–44)
AST: 17 U/L (ref 15–41)
Albumin: 3.7 g/dL (ref 3.5–5.0)
Anion gap: 6 (ref 5–15)
BUN: 11 mg/dL (ref 8–23)
CALCIUM: 9.8 mg/dL (ref 8.9–10.3)
CHLORIDE: 108 mmol/L (ref 98–111)
CO2: 31 mmol/L (ref 22–32)
CREATININE: 0.8 mg/dL (ref 0.44–1.00)
Glucose, Bld: 61 mg/dL — ABNORMAL LOW (ref 70–99)
Potassium: 3.7 mmol/L (ref 3.5–5.1)
Sodium: 145 mmol/L (ref 135–145)
Total Bilirubin: 0.4 mg/dL (ref 0.3–1.2)
Total Protein: 7.1 g/dL (ref 6.5–8.1)

## 2017-11-10 LAB — LACTATE DEHYDROGENASE: LDH: 166 U/L (ref 98–192)

## 2017-11-11 LAB — BETA 2 MICROGLOBULIN, SERUM: Beta-2 Microglobulin: 1.2 mg/L (ref 0.6–2.4)

## 2017-11-11 LAB — KAPPA/LAMBDA LIGHT CHAINS
Kappa free light chain: 12.5 mg/L (ref 3.3–19.4)
Kappa, lambda light chain ratio: 1.52 (ref 0.26–1.65)
LAMDA FREE LIGHT CHAINS: 8.2 mg/L (ref 5.7–26.3)

## 2017-11-12 LAB — PROTEIN ELECTROPHORESIS, SERUM, WITH REFLEX
A/G RATIO SPE: 1.4 (ref 0.7–1.7)
ALBUMIN ELP: 3.7 g/dL (ref 2.9–4.4)
Alpha-1-Globulin: 0.2 g/dL (ref 0.0–0.4)
Alpha-2-Globulin: 0.6 g/dL (ref 0.4–1.0)
BETA GLOBULIN: 1.2 g/dL (ref 0.7–1.3)
Gamma Globulin: 0.7 g/dL (ref 0.4–1.8)
Globulin, Total: 2.7 g/dL (ref 2.2–3.9)
M-Spike, %: 0.1 g/dL — ABNORMAL HIGH
SPEP INTERP: 0
Total Protein ELP: 6.4 g/dL (ref 6.0–8.5)

## 2017-11-12 LAB — IMMUNOFIXATION REFLEX, SERUM
IGM (IMMUNOGLOBULIN M), SRM: 36 mg/dL (ref 26–217)
IgA: 246 mg/dL (ref 87–352)
IgG (Immunoglobin G), Serum: 1075 mg/dL (ref 700–1600)

## 2017-11-14 ENCOUNTER — Other Ambulatory Visit: Payer: Self-pay | Admitting: Hematology and Oncology

## 2017-11-17 ENCOUNTER — Telehealth: Payer: Self-pay | Admitting: Hematology and Oncology

## 2017-11-17 ENCOUNTER — Encounter: Payer: Self-pay | Admitting: Hematology and Oncology

## 2017-11-17 ENCOUNTER — Inpatient Hospital Stay: Payer: BLUE CROSS/BLUE SHIELD | Admitting: Hematology and Oncology

## 2017-11-17 ENCOUNTER — Other Ambulatory Visit: Payer: Self-pay | Admitting: Hematology and Oncology

## 2017-11-17 VITALS — BP 121/70 | HR 62 | Temp 98.3°F | Resp 17 | Ht 66.0 in | Wt 133.2 lb

## 2017-11-17 DIAGNOSIS — D472 Monoclonal gammopathy: Secondary | ICD-10-CM

## 2017-11-17 DIAGNOSIS — Z1231 Encounter for screening mammogram for malignant neoplasm of breast: Secondary | ICD-10-CM

## 2017-11-17 NOTE — Patient Instructions (Signed)
We discussed in detail the results of your laboratory studies .  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  We discussed also the natural history and behavior of a monoclonal gammopathy of undetermined significance (MGUS).  A bilateral screening mammogram was recommended and scheduled within the next month at [facility].  If you do not receive those results please call our office within 1 week following your examination.  Barring any unforeseen complications, your next scheduled lab work is on March 15, 2018.  Follow-up appointment has been scheduled on February 18 to discuss those results.  Please do not hesitate to call for those results in the interim should any new or untoward problems arise.  Thank you! Ladona Ridgel, MD Hematology/Oncology

## 2017-11-17 NOTE — Telephone Encounter (Signed)
Appts scheduled/ BC appt was also scheduled/ avs calendar printed per 10/9 los

## 2017-11-17 NOTE — Progress Notes (Signed)
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  Brief History: Vickie Pearson is a 62 year old resident of [place], originally from [place] and [place], whose past medical history is significant for degenerative disc disease involving the cervical spine and bilateral knees; dyslipidemia; costochondritis; benign thyroid nodules; and seronegative rheumatoid arthritis.  Her primary care provider is Dr. Karle Plumber.  Vickie Pearson was seen initially and evaluated by Dr. Oliva Bustard, hematology, at [facility] on November 12, 2016.  She had been aware for several years that her "proteins in the blood" were elevated.  Initial laboratory studies are detailed below.  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  She had no evidence of anemia, hypercalcemia, suspicious bone pain, or renal failure.    A skeletal survey was performed which failed to reveal any evidence of osteolytic skeletal disease.  She has been on parenteral methotrexate for the past year for her seronegative rheumatoid arthritis.  At the time of her last visit on May 31, 2017 there were no significant hematologic changes or progressive renal dysfunction.  Her kappa/lambda light chains and light chain ratio were essentially unchanged.  She has been followed by Dr. Bo Merino, rheumatology and Hazel Sams, rheumatology nurse practitioner.  Prior to today's visit, laboratory studies were obtained on November 10, 2017.  Those results are detailed below.   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  Interval History: In the interim since her last visit,  she denies any new problems or complaints.  She is taking no new medications.  Both her appetite and weight remain stable.  Her energy level has improved.  She reports no fever, shaking chills, sweats, or flulike symptoms.  There is no rash or itching.  She has frequent sinus headaches.  She denies any dizziness, lightheadedness, syncope, near syncopal episodes. She has no pain or difficulty in swallowing. She has occasional dyspepsia easily controlled with Tums. There is no chest or abdominal pain.  There is no unusual cough, sore throat, or orthopnea.  She reports no bleeding tendency.  She has no nausea, vomiting, diarrhea, or constipation.  There is no change in bowel habits. There is no melena or bright red blood per rectum.  There is no urinary frequency, urgency, hematuria, or dysuria.  She has lower back pain which appears to respond to heat treatments.  She denies any new bone, joint, or muscle pain.  She reports no numbness or tingling in the fingers or toes.  Past Medical History Reviewed        Family History Reviewed       Social History Reviewed  Allergies  Allergen Reactions  . Penicillins Hives    Has patient had a PCN reaction causing immediate rash, facial/tongue/throat swelling, SOB or lightheadedness with hypotension: No Has patient had a PCN reaction causing severe rash involving mucus membranes or skin necrosis: Yes Has patient had a PCN reaction that required hospitalization: No Has patient had a  PCN reaction occurring within the last 10 years: No If all of the above answers are "NO", then may proceed with Cephalosporin use.   . Piroxicam Hives  . Sulfa Antibiotics Hives   Current Outpatient Medications  on File Prior to Visit  Medication Sig  . cholecalciferol (VITAMIN D) 1000 units tablet Take 3,000 Units by mouth daily.  . fluticasone (FLONASE) 50 MCG/ACT nasal spray Place 1 spray into both nostrils as needed for allergies or rhinitis.  . folic acid (FOLVITE) 1 MG tablet Take 2 tablets (2 mg total) by mouth daily.  . methotrexate 50 MG/2ML injection Inject 0.8 mLs (20 mg total) into the skin once a week.  . Tuberculin-Allergy Syringes (B-D ALLERGY SYRINGE 1CC/28G) 28G X 1/2" 1 ML MISC PATIENT TO INJECT METHOTREXATE ONCE WEEKLY.   No current facility-administered medications on file prior to visit.     Review of Systems: Constitutional: No fever, sweats, or shaking chills.  No appetite or weight deficit.  Energy level stable. Skin: No rash, scaling, sores, lumps, or jaundice. HEENT: No visual changes or hearing deficit; chronic sinusitis; allergic rhinitis; benign thyroid nodules. Pulmonary: No unusual cough, sore throat, or orthopnea. Breasts: No complaints; last bilateral screening mammogram 2017. Cardiovascular: No coronary artery disease, angina, or myocardial infarction.  No cardiac dysrhythmia, essential hypertension; dyslipidemia. Gastrointestinal: No indigestion, dysphagia, abdominal pain, diarrhea, or constipation.  No change in bowel habits. Genitourinary: No frequency, urgency, hematuria, or dysuria. Musculoskeletal: No arthralgias or myalgias; no joint swelling, pain, or instability; fibromyalgia; costochondritis. Hematologic: No bleeding tendency or easy bruisability. Endocrine: No intolerance to heat or cold; no thyroid disease or diabetes mellitus. Vascular: No peripheral arterial or venous thromboembolic disease. Psychological: No anxiety, depression, or mood changes; no mental health illnesses. Neurological: Occasional dizziness and lightheadedness; no syncope, or near syncopal episodes; no numbness or tingling in the fingers or toes.  Physical Examination: Vital Signs:  Body surface area is 1.68 meters squared.  Vitals:   11/17/17 1030  BP: 121/70  Pulse: 62  Resp: 17  Temp: 98.3 F (36.8 C)  SpO2: 100%    Filed Weights   11/17/17 1030  Weight: 133 lb 3.2 oz (60.4 kg)  Constitutional: Vickie Pearson is a fully nourished and developed African-American. She looks age appropriate.  She is friendly and cooperative without respiratory compromise at rest. Skin: No rashes, scaling, dryness, jaundice, or itching. HEENT: Head is normocephalic and atraumatic.  Pupils are equal round and reactive to light and accommodation.  Sclerae are anicteric.  Conjunctivae are pink.  No sinus tenderness nor oropharyngeal lesions.  Lips without cracking or peeling; tongue without mass, inflammation, or nodularity.  Mucous membranes are moist. Neck: Supple and symmetric.  No jugular venous distention or thyromegaly.  Trachea is midline. Lymphatics: No cervical or supraclavicular lymphadenopathy.  No epitrochlear, axillary, or inguinal lymphadenopathy is appreciated. Breasts: No mass, discharge, dimpling, or retraction. Respiratory/chest: Thorax is symmetrical.  Breath sounds are clear to auscultation and percussion.  Normal excursion and respiratory effort. Back: Symmetric without deformity or tenderness. Cardiovascular: Heart rate and rhythm are regular without murmurs. Gastrointestinal: Abdomen is soft, nontender; no organomegaly.  Bowel sounds are normoactive.  No masses are appreciated. Rectal examination: Not performed. Extremities: In the lower extremities, there is no asymmetric swelling, erythema, tenderness, or cord formation.  No clubbing, cyanosis, nor edema. Hematologic: No petechiae, hematomas, or ecchymoses. Psychological: She is oriented to person, place, and time; normal affect, memory, and cognition. Neurological: There are no gross neurologic deficits.  Laboratory Results: CBC  Latest Ref Rng & Units 11/10/2017 09/29/2017 07/10/2017  WBC 3.9 - 10.3 K/uL  4.2 4.7 4.4  Hemoglobin 11.6 - 15.9 g/dL 12.5 13.2 14.2  Hematocrit 34.8 - 46.6 % 38.7 40.0 42.9  Platelets 145 - 400 K/uL 270 307 285    CMP Latest Ref Rng & Units 11/10/2017 09/29/2017 07/10/2017  Glucose 70 - 99 mg/dL 61(L) 73 133(H)  BUN 8 - 23 mg/dL '11 14 12  ' Creatinine 0.44 - 1.00 mg/dL 0.80 0.79 0.85  Sodium 135 - 145 mmol/L 145 142 142  Potassium 3.5 - 5.1 mmol/L 3.7 4.9 3.7  Chloride 98 - 111 mmol/L 108 105 106  CO2 22 - 32 mmol/L '31 28 25  ' Calcium 8.9 - 10.3 mg/dL 9.8 9.6 10.0  Total Protein 6.5 - 8.1 g/dL 7.1 6.9 -  Total Bilirubin 0.3 - 1.2 mg/dL 0.4 0.3 -  Alkaline Phos 38 - 126 U/L 57 - -  AST 15 - 41 U/L 17 16 -  ALT 0 - 44 U/L 9 10 -   November 10, 2017  Ref Range & Units 7d ago  Total Protein ELP 6.0 - 8.5 g/dL 6.4   Albumin ELP 2.9 - 4.4 g/dL 3.7   Alpha-1-Globulin 0.0 - 0.4 g/dL 0.2   Alpha-2-Globulin 0.4 - 1.0 g/dL 0.6   Beta Globulin 0.7 - 1.3 g/dL 1.2   Gamma Globulin 0.4 - 1.8 g/dL 0.7   M-Spike, % Not Observed g/dL 0.1High    GLOBULIN, TOTAL 2.2 - 3.9 g/dL 2.7 VC  A/G Ratio 0.7 - 1.7 1.4 VC    Ref Range & Units 7d ago  IgG (Immunoglobin G), Serum 700 - 1,600 mg/dL 1,075   IgA 87 - 352 mg/dL 246   IgM (Immunoglobulin M), Srm 26 - 217 mg/dL 36   IFE 1  Comment   Comment: (NOTE)  Immunofixation shows IgA monoclonal protein with kappa light chain  specificity.   Ref Range & Units 7d ago   Kappa free light chain 3.3 - 19.4 mg/L 12.5   Lamda free light chains 5.7 - 26.3 mg/L 8.2   Kappa, lamda light chain ratio 0.26 - 1.65 1.52   Comment: (NOTE)   Beta-2 microglobulin 1.2 LDH 166  November 12, 2016 . WBC 11/12/2016 6.9  3.9 - 10.3 10e3/uL Final  . NEUT# 11/12/2016 4.6  1.5 - 6.5 10e3/uL Final  . HGB 11/12/2016 13.1  11.6 - 15.9 g/dL Final  . HCT 11/12/2016 40.5  34.8 - 46.6 % Final  . Platelets 11/12/2016 260  145 - 400 10e3/uL Final  . MCV 11/12/2016 100.7  79.5 - 101.0 fL Final  . MCH 11/12/2016 32.6  25.1 - 34.0 pg Final  . MCHC 11/12/2016 32.3   31.5 - 36.0 g/dL Final  . RBC 11/12/2016 4.02  3.70 - 5.45 10e6/uL Final  . RDW 11/12/2016 15.7* 11.2 - 14.5 % Final  . lymph# 11/12/2016 1.6  0.9 - 3.3 10e3/uL Final  . MONO# 11/12/2016 0.5  0.1 - 0.9 10e3/uL Final  . Eosinophils Absolute 11/12/2016 0.1  0.0 - 0.5 10e3/uL Final  . Basophils Absolute 11/12/2016 0.0  0.0 - 0.1 10e3/uL Final  . NEUT% 11/12/2016 67.2  38.4 - 76.8 % Final  . LYMPH% 11/12/2016 23.9  14.0 - 49.7 % Final  . MONO% 11/12/2016 7.7  0.0 - 14.0 % Final  . EOS% 11/12/2016 0.9  0.0 - 7.0 % Final  . BASO% 11/12/2016 0.3  0.0 - 2.0 % Final  . Sodium 11/12/2016 141  136 - 145 mEq/L Final  . Potassium 11/12/2016 3.8  3.5 - 5.1 mEq/L Final  . Chloride 11/12/2016 106  98 - 109 mEq/L Final  . CO2 11/12/2016 27  22 - 29 mEq/L Final  . Glucose 11/12/2016 87  70 - 140 mg/dl Final   Glucose reference range is for nonfasting patients. Fasting glucose reference range is 70- 100.  Marland Kitchen BUN 11/12/2016 12.0  7.0 - 26.0 mg/dL Final  . Creatinine 11/12/2016 0.9  0.6 - 1.1 mg/dL Final  . Total Bilirubin 11/12/2016 0.30  0.20 - 1.20 mg/dL Final  . Alkaline Phosphatase 11/12/2016 61  40 - 150 U/L Final  . AST 11/12/2016 18  5 - 34 U/L Final  . ALT 11/12/2016 12  0 - 55 U/L Final  . Total Protein 11/12/2016 7.0  6.4 - 8.3 g/dL Final  . Albumin 11/12/2016 3.7  3.5 - 5.0 g/dL Final  . Calcium 11/12/2016 9.6  8.4 - 10.4 mg/dL Final  . Anion Gap 11/12/2016 8  3 - 11 mEq/L Final  . EGFR 11/12/2016 79* >90 ml/min/1.73 m2 Final   eGFR is calculated using the CKD-EPI Creatinine Equation (2009)  . LDH 11/12/2016 187  125 - 245 U/L Final  . Uric Acid, Serum 11/12/2016 3.6  2.6 - 7.4 mg/dl Final  . Ig Kappa Free Light Chain 11/12/2016 10.1  3.3 - 19.4 mg/L Final  . Ig Lambda Free Light Chain 11/12/2016 9.6  5.7 - 26.3 mg/L Final  . Kappa/Lambda FluidC Ratio 11/12/2016 1.05  0.26 - 1.65 Final   Diagnostic/Imaging Studies: November 24, 2016 METASTATIC BONE SURVEY  COMPARISON:   None.  FINDINGS: The lungs are clear and well aerated. Mediastinal and hilar contours are unremarkable. The heart is within normal limits in size.  A lateral view the skull shows no radiolucent lesion.  The cervical vertebrae are straightened in alignment. There is degenerative disc disease from C4-C7 with loss of disc space and sclerosis with spurring. No radiolucent lesion or compression deformity is seen.  There is mild thoracic curvature convex to the right. No compression deformity is noted.  The lumbar vertebrae are normal alignment. No compression deformity is seen. Mild degenerative disc disease is noted at L5-S1.  A view of the pelvis shows no radiolucent lesion. The hips appear to be normally positioned in the pelvic rami are intact. The SI joints are corticated.  Views the right shoulder, right humerus, and right forearm show no radiolucent lesion.  Views of the left shoulder, left humerus, and left forearm show no radiolucent lesion. There is degenerative change probably due to prior distal left ulnar fracture.  Views of the right femur, and right tibia and fibula show degenerative change of the right knee but no radiolucent lesion is seen.  Views of the left femur, tibia and fibula also show degenerative change in the left knee but no radiolucent lesion is noted.  IMPRESSION: 1. No radiolucent lesion is seen. 2. Degenerative change in the cervical and lumbar spine. No compression deformity. 3. Degenerative change in both knees.  Ivar Drape M.D. 11/24/2016 11:38  April 20, 2013 [facility] radiology [facility] Bilateral screening mammography: There are scattered areas of fibroglandular density.  There are widely scattered coarse calcifications in both breast which are stable.  Dermal calcifications are noted in the inferior aspect of the left breast which is stable.  No dominant mass, newly clustered  microcalcifications, architectural distortion or skin changes are present to suggest malignancy. Annual screening mammogram recommended  BI-RADS 2  Summary/Assessment:   Recommendation/Plan: We discussed in detail the results of her laboratory studies from October 2 compared with the prior studies in April, 2019 and October 2018.  Those results are detailed below.  We discussed also the natural history and behavior of a monoclonal gammopathy of undetermined significance (MGUS).  Questions were answered to her satisfaction.  The most recent mammogram from [facility] was dated 2015.  It was recommended that a bilateral screening mammogram be performed as soon as possible as part of health maintenance.  It has been scheduled at [facility] within the next 4 weeks.  She was advised thereafter to obtain yearly mammographic surveillance.  She was advised to call us for those results after 7 days if not notified sooner by the [place].  Barring any unforeseen complications her next scheduled laboratory studies will be performed on March 15, 2018.  A follow-up appointment has been scheduled on February 18 to discuss those results.  She was advised to call us in the interim should any new or untoward problems arise.  The total time spent discussing the laboratory evaluation, natural history and behavior of a monoclonal gammopathy of undetermined significance, consideration and recommendations was 25 minutes. At least 50% of that time was spent in discussion, reviewing outside records, laboratory evaluation, counseling, and answering questions.  This note was dictated using voice activated technology/software.  Unfortunately, typographical errors are not uncommon, and transcription is subject to mistakes and regrettably misinterpretation.  If necessary, clarification of the above information can be discussed with me at any time.  FOLLOW UP: AS DIRECTED   cc:      Karle Plumber, MD  Henreitta Leber, MD Hematology/Oncology [facility] [address] [place], [place] [address] Office: [phone] WIOM: [phone]

## 2017-11-19 NOTE — Telephone Encounter (Signed)
LMOM for patient to call and schedule Synvisc injections °

## 2017-12-23 ENCOUNTER — Ambulatory Visit
Admission: RE | Admit: 2017-12-23 | Discharge: 2017-12-23 | Disposition: A | Discharge status: Home / Self Care | Payer: BLUE CROSS/BLUE SHIELD | Source: Ambulatory Visit | Attending: Hematology and Oncology | Admitting: Hematology and Oncology

## 2017-12-23 DIAGNOSIS — Z1231 Encounter for screening mammogram for malignant neoplasm of breast: Secondary | ICD-10-CM

## 2018-01-07 ENCOUNTER — Other Ambulatory Visit: Payer: Self-pay | Admitting: Physician Assistant

## 2018-01-07 DIAGNOSIS — Z79899 Other long term (current) drug therapy: Secondary | ICD-10-CM

## 2018-01-10 ENCOUNTER — Telehealth: Payer: Self-pay | Admitting: Rheumatology

## 2018-01-10 DIAGNOSIS — Z79899 Other long term (current) drug therapy: Secondary | ICD-10-CM

## 2018-01-10 MED ORDER — "TUBERCULIN-ALLERGY SYRINGES 28G X 1/2"" 1 ML MISC": 3 refills | Status: DC

## 2018-01-10 NOTE — Telephone Encounter (Signed)
Patient called requesting prescription refill of Methotrexate and syringes to be sent to [facility] on [address] in [place].  Patient states she took her last injection yesterday.

## 2018-01-10 NOTE — Telephone Encounter (Signed)
ok 

## 2018-01-10 NOTE — Telephone Encounter (Signed)
Last Visit: 09/29/17 Next visit: 03/02/18 Labs: 11/10/17 low glucose  Okay to refill per Dr. Estanislado Pandy

## 2018-01-10 NOTE — Telephone Encounter (Signed)
Last Visit: 09/29/17 Next visit: 03/02/18  Okay to refill per Dr. Estanislado Pandy

## 2018-01-31 ENCOUNTER — Other Ambulatory Visit: Payer: Self-pay | Admitting: Physician Assistant

## 2018-01-31 DIAGNOSIS — Z79899 Other long term (current) drug therapy: Secondary | ICD-10-CM

## 2018-02-17 NOTE — Progress Notes (Deleted)
Office Visit Note  Patient: Vickie Pearson             Date of Birth: [birth year]           MRN: [id number]             PCP: Ladell Pier, MD Referring: Ladell Pier, MD Visit Date: 03/02/2018 Occupation: @GUAROCC @  Subjective:  No chief complaint on file.   History of Present Illness: Vickie Pearson is a 63 y.o. female ***   Activities of Daily Living:  Patient reports morning stiffness for *** {minute/hour:19697}.   Patient {ACTIONS;DENIES/REPORTS:21021675::"Denies"} nocturnal pain.  Difficulty dressing/grooming: {ACTIONS;DENIES/REPORTS:21021675::"Denies"} Difficulty climbing stairs: {ACTIONS;DENIES/REPORTS:21021675::"Denies"} Difficulty getting out of chair: {ACTIONS;DENIES/REPORTS:21021675::"Denies"} Difficulty using hands for taps, buttons, cutlery, and/or writing: {ACTIONS;DENIES/REPORTS:21021675::"Denies"}  No Rheumatology ROS completed.   PMFS History:  Patient Active Problem List   Diagnosis Date Noted  . Other chest pain 03/01/2017  . Rheumatoid arthritis involving multiple sites ([place]) 07/24/2016  . Cervical radiculopathy 07/24/2016  . Hyperlipidemia 07/24/2016  . Osteoporosis 07/24/2016  . Vitamin D deficiency 07/24/2016  . Multiple thyroid nodules 07/24/2016  . MGUS (monoclonal gammopathy of unknown significance) 07/24/2016  . Colon polyps 07/24/2016  . 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 Hx of abnormal cervical Pap smear 07/24/2016  . OA (osteoarthritis) of knee 07/24/2016    Past Medical History:  Diagnosis Date  . Colon polyps   . Costochondritis   . DDD (degenerative disc disease), cervical   . Degenerative arthritis of knee    Bilateral  . Degenerative disc disease, cervical   . Degenerative disc disease, cervical   . Fibromyalgia   . Hyperlipidemia   . Monoclonal gammopathy   . Multiple thyroid nodules   . Rheumatoid arthritis ([place])     Family History  Problem Relation Age of Onset  . Hypertension Mother     . Colon polyps Mother   . Stroke Mother   . Arthritis Mother   . Hypertension Father   . Heart disease Father   . Arthritis Father   . Kidney disease Father   . Hypertension Sister   . Hyperlipidemia Sister   . Hypertension Brother   . Hyperlipidemia Brother    No past surgical history on file. Social History   Social History Narrative  . Not on file   Immunization History  Administered Date(s) Administered  . Influenza, Quadrivalent, Recombinant, Inj, Pf 11/15/2017  . Influenza,inj,Quad PF,6+ Mos 11/09/2016     Objective: Vital Signs: There were no vitals taken for this visit.   Physical Exam   Musculoskeletal Exam: ***  CDAI Exam: CDAI Score: Not documented Patient Global Assessment: Not documented; Provider Global Assessment: Not documented Swollen: Not documented; Tender: Not documented Joint Exam   Not documented   There is currently no information documented on the homunculus. Go to the Rheumatology activity and complete the homunculus joint exam.  Investigation: No additional findings.  Imaging: No results found.  Recent Labs: Lab Results  Component Value Date   WBC 4.2 11/10/2017   HGB 12.5 11/10/2017   PLT 270 11/10/2017   NA 145 11/10/2017   K 3.7 11/10/2017   CL 108 11/10/2017   CO2 31 11/10/2017   GLUCOSE 61 (L) 11/10/2017   BUN 11 11/10/2017   CREATININE 0.80 11/10/2017   BILITOT 0.4 11/10/2017   ALKPHOS 57 11/10/2017   AST 17 11/10/2017   ALT 9 11/10/2017   PROT 7.1 11/10/2017   ALBUMIN 3.7 11/10/2017   CALCIUM 9.8 11/10/2017  GFRAA >60 11/10/2017    Speciality Comments: No specialty comments available.  Procedures:  No procedures performed Allergies: Penicillins; Piroxicam; and Sulfa antibiotics   Assessment / Plan:     Visit Diagnoses: Rheumatoid arthritis of multiple sites with negative rheumatoid factor (HCC)  High risk medication use -  Methotrexate 8 tablets by mouth every week, folic acid 2 mg by mouth daily.    Chronic iritis of both eyes  Primary osteoarthritis of both knees  DDD (degenerative disc disease), cervical  Age-related osteoporosis without current pathological fracture  Vitamin D deficiency  Monoclonal gammopathy  Mixed hyperlipidemia  Multiple thyroid nodules  Trochanteric bursitis, left hip   Orders: No orders of the defined types were placed in this encounter.  No orders of the defined types were placed in this encounter.   Face-to-face time spent with patient was *** minutes. Greater than 50% of time was spent in counseling and coordination of care.  Follow-Up Instructions: No follow-ups on file.   Ofilia Neas, PA-C  Note - This record has been created using Dragon software.  Chart creation errors have been sought, but may not always  have been located. Such creation errors do not reflect on  the standard of medical care.

## 2018-03-02 ENCOUNTER — Ambulatory Visit: Payer: BLUE CROSS/BLUE SHIELD | Admitting: Physician Assistant

## 2018-03-02 ENCOUNTER — Encounter: Payer: Self-pay | Admitting: Rheumatology

## 2018-03-03 ENCOUNTER — Telehealth: Payer: Self-pay | Admitting: Rheumatology

## 2018-03-03 DIAGNOSIS — M810 Age-related osteoporosis without current pathological fracture: Secondary | ICD-10-CM

## 2018-03-03 DIAGNOSIS — H2013 Chronic iridocyclitis, bilateral: Secondary | ICD-10-CM

## 2018-03-03 DIAGNOSIS — M0609 Rheumatoid arthritis without rheumatoid factor, multiple sites: Secondary | ICD-10-CM

## 2018-03-03 NOTE — Telephone Encounter (Signed)
Okay to refer patient per her request.

## 2018-03-03 NOTE — Telephone Encounter (Signed)
Patient called requesting Dr. Estanislado Pandy fax a referral to Dr. Hermelinda Medicus, Rheumatologist at [facility].   Fax [phone]

## 2018-03-04 NOTE — Telephone Encounter (Signed)
Referral placed.

## 2018-03-08 ENCOUNTER — Telehealth: Payer: Self-pay | Admitting: Internal Medicine

## 2018-03-08 NOTE — Telephone Encounter (Signed)
Former Pharmacologist patient last seen October 2019. Updated lab/fu appointments. Left message for patient re lab 1/31 and f/u 2/7. Schedule also mailed.

## 2018-03-09 NOTE — Telephone Encounter (Signed)
Patient returned call re appointments for lab/fu. Per patient requests appointments were cancelled due to an insurance change. Per patient she now has [facility] and has established care at [facility].

## 2018-03-11 ENCOUNTER — Other Ambulatory Visit: Payer: BLUE CROSS/BLUE SHIELD

## 2018-03-15 ENCOUNTER — Other Ambulatory Visit: Payer: BLUE CROSS/BLUE SHIELD

## 2018-03-18 ENCOUNTER — Ambulatory Visit: Payer: BLUE CROSS/BLUE SHIELD | Admitting: Internal Medicine

## 2018-04-27 ENCOUNTER — Other Ambulatory Visit: Payer: Self-pay | Admitting: Rheumatology

## 2018-04-27 ENCOUNTER — Telehealth: Payer: Self-pay | Admitting: Rheumatology

## 2018-04-27 DIAGNOSIS — Z79899 Other long term (current) drug therapy: Secondary | ICD-10-CM

## 2018-04-27 NOTE — Telephone Encounter (Signed)
Patient advised that per Dr. Estanislado Pandy we will be unable to refill MTX without updated labs. Patient states she has had a change in insurance and she has had to change rheumatologist. Patient advised that the [facility] is not through our office so it should be covered by insurance.

## 2018-04-27 NOTE — Telephone Encounter (Signed)
Patient requesting a refill on MTX, and Folic Acid sent to [facility] on [address]. Patient has an appt with her new Rheumatologist in April, but will be out before then.

## 2018-11-16 ENCOUNTER — Telehealth: Payer: Self-pay | Admitting: *Deleted

## 2018-11-16 NOTE — Telephone Encounter (Signed)
This patient called to schedule an appt with Dr. Walden Field as she understood that she replaced Dr. Denyse Amass.  Advised that Dr. Walden Field no longer works with [place]. Advised that I would send a scheduling request and that another provider would be assigned.   She states she thinks her appt would be in December.  Scheduling message sent

## 2018-11-24 ENCOUNTER — Encounter (HOSPITAL_COMMUNITY): Payer: Self-pay | Admitting: Emergency Medicine

## 2018-11-24 ENCOUNTER — Telehealth: Payer: Self-pay | Admitting: Hematology and Oncology

## 2018-11-24 ENCOUNTER — Other Ambulatory Visit: Payer: Self-pay

## 2018-11-24 ENCOUNTER — Emergency Department (HOSPITAL_COMMUNITY)
Admission: EM | Admit: 2018-11-24 | Discharge: 2018-11-24 | Disposition: A | Discharge status: Home / Self Care | Payer: Medicare Other | Attending: Emergency Medicine | Admitting: Emergency Medicine

## 2018-11-24 ENCOUNTER — Emergency Department (HOSPITAL_COMMUNITY): Payer: Medicare Other

## 2018-11-24 DIAGNOSIS — Z87891 Personal history of nicotine dependence: Secondary | ICD-10-CM | POA: Insufficient documentation

## 2018-11-24 DIAGNOSIS — R002 Palpitations: Secondary | ICD-10-CM | POA: Diagnosis present

## 2018-11-24 DIAGNOSIS — R0789 Other chest pain: Secondary | ICD-10-CM | POA: Insufficient documentation

## 2018-11-24 HISTORY — DX: Gastro-esophageal reflux disease without esophagitis: K21.9

## 2018-11-24 LAB — TROPONIN I (HIGH SENSITIVITY)
Troponin I (High Sensitivity): <2 ng/L (ref <18)
Troponin I (High Sensitivity): 3 ng/L (ref <18)

## 2018-11-24 LAB — CBC
HCT: 40.9 % (ref 36.0–46.0)
Hemoglobin: 13 g/dL (ref 12.0–15.0)
MCH: 30.5 pg (ref 26.0–34.0)
MCHC: 31.8 g/dL (ref 30.0–36.0)
MCV: 96 fL (ref 80.0–100.0)
Platelets: 269 10*3/uL (ref 150–400)
RBC: 4.26 MIL/uL (ref 3.87–5.11)
RDW: 13.6 % (ref 11.5–15.5)
WBC: 4.5 10*3/uL (ref 4.0–10.5)
nRBC: 0 % (ref 0.0–0.2)

## 2018-11-24 LAB — BASIC METABOLIC PANEL
Anion gap: 10 (ref 5–15)
BUN: 9 mg/dL (ref 8–23)
CO2: 24 mmol/L (ref 22–32)
Calcium: 9.7 mg/dL (ref 8.9–10.3)
Chloride: 105 mmol/L (ref 98–111)
Creatinine, Ser: 0.85 mg/dL (ref 0.44–1.00)
GFR calc Af Amer: >60 mL/min (ref >60)
GFR calc non Af Amer: >60 mL/min (ref >60)
Glucose, Bld: 105 mg/dL — ABNORMAL HIGH (ref 70–99)
Potassium: 3.4 mmol/L — ABNORMAL LOW (ref 3.5–5.1)
Sodium: 139 mmol/L (ref 135–145)

## 2018-11-24 LAB — TSH: TSH: 0.935 u[IU]/mL (ref 0.350–4.500)

## 2018-11-24 MED ORDER — METHOCARBAMOL 500 MG PO TABS: 500.0000 mg | ORAL_TABLET | Freq: Every evening | ORAL | 0 refills | Status: DC | PRN

## 2018-11-24 MED ORDER — SODIUM CHLORIDE 0.9% FLUSH: 3.0000 mL | Freq: Once | INTRAVENOUS | Status: DC

## 2018-11-24 NOTE — Telephone Encounter (Signed)
Returned call to patient regarding request for December follow up with Dr. Walden Field per message from desk nurse.   Patient was last seen by Dr. Truddie Coco and per patient she never saw Dr. Walden Field due to insurance change and having to go to [facility]. Per patient she has changed insurance again and wants to come back to [facility]. Per patient insurance will be [employer] in December and she would like an appointment for December. Per patient their are no hematology records from [facility] due to she only saw a rheumatologist there.   Patient aware I will contact her in December regarding setting up appointment and she can also call me to let me know when the insurance has changed to [facility].

## 2018-11-24 NOTE — ED Provider Notes (Signed)
[facility] EMERGENCY DEPARTMENT Provider Note   CSN: [id number] Arrival date & time: 11/24/18  0702     History   Chief Complaint Chief Complaint  Patient presents with  . Palpitations  . Chest Pain    HPI Vickie Pearson is a 63 y.o. female.     HPI   Pt is a 63 y/o female with a h/o DDD, fibromyalgia, GERD, HLD, RA who presents to the ED today for eval of palpitations and chest pain.   States that palpitations and chest pain have been ongoing for about a week.  Symptoms are intermittent.  Chest pain is located to the left side of her chest.  Episodes last 5 to 10 minutes at a time.  She states it feels like a pins/needles sensation in her chest and that she feels like she is having some congestion in her chest.  Her symptoms are worse with certain movements and activities like when she mops or when she is working too hard.  She states pain radiates to her back, left arm and into her jaw.  She intermittently feels lightheaded with the pain.  She denies any shortness of breath or pleuritic pain.  Denies any lower extremity swelling.  States she took omeprazole for her symptoms which helped she does have history of GERD.  States she had similar symptoms earlier this year back in April and was seen at urgent care but had reassuring work-up.  She reports history of hyperlipidemia but denies history of hypertension, diabetes, tobacco use or early family history of heart disease.  Past Medical History:  Diagnosis Date  . Colon polyps   . Costochondritis   . DDD (degenerative disc disease), cervical   . Degenerative arthritis of knee    Bilateral  . Degenerative disc disease, cervical   . Degenerative disc disease, cervical   . Fibromyalgia   . GERD (gastroesophageal reflux disease)   . Hyperlipidemia   . Monoclonal gammopathy   . Multiple thyroid nodules   . Rheumatoid arthritis [facility])     Patient Active Problem List   Diagnosis Date Noted  . Other  chest pain 03/01/2017  . Rheumatoid arthritis involving multiple sites ([place]) 07/24/2016  . Cervical radiculopathy 07/24/2016  . Hyperlipidemia 07/24/2016  . Osteoporosis 07/24/2016  . Vitamin D deficiency 07/24/2016  . Multiple thyroid nodules 07/24/2016  . MGUS (monoclonal gammopathy of unknown significance) 07/24/2016  . Colon polyps 07/24/2016  . 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 Hx of abnormal cervical Pap smear 07/24/2016  . OA (osteoarthritis) of knee 07/24/2016    No past surgical history on file.   OB History   No obstetric history on file.      Home Medications    Prior to Admission medications   Medication Sig Start Date End Date Taking? Authorizing Provider  cholecalciferol (VITAMIN D) 1000 units tablet Take 3,000 Units by mouth daily.    [provider]  fluticasone (FLONASE) 50 MCG/ACT nasal spray Place 1 spray into both nostrils as needed for allergies or rhinitis.    [provider]  folic acid (FOLVITE) 1 MG tablet Take 2 tablets (2 mg total) by mouth daily. 05/05/17   Bo Merino, MD  methocarbamol (ROBAXIN) 500 MG tablet Take 1 tablet (500 mg total) by mouth at bedtime as needed for muscle spasms. 11/24/18   Deanna Wiater S, PA-C  methotrexate 50 MG/2ML injection INJECT 0.8 MLS (20 MG TOTAL) INTO THE SKIN ONCE A WEEK 01/10/18  Bo Merino, MD  Tuberculin-Allergy Syringes (B-D ALLERGY SYRINGE 1CC/28G) 28G X 1/2" 1 ML MISC PATIENT TO INJECT METHOTREXATE ONCE WEEKLY. 01/10/18   Bo Merino, MD    Family History Family History  Problem Relation Age of Onset  . Hypertension Mother   . Colon polyps Mother   . Stroke Mother   . Arthritis Mother   . Hypertension Father   . Heart disease Father   . Arthritis Father   . Kidney disease Father   . Hypertension Sister   . Hyperlipidemia Sister   . Hypertension Brother   . Hyperlipidemia Brother     Social History Social History   Tobacco Use  . Smoking status:  Former Smoker    Packs/day: 1.00    Types: Cigarettes    Quit date: 07/24/1988    Years since quitting: 30.3  . Smokeless tobacco: Never Used  Substance Use Topics  . Alcohol use: Yes    Comment: occasionally  . Drug use: No     Allergies   Penicillins, Piroxicam, and Sulfa antibiotics   Review of Systems Review of Systems  Constitutional: Negative for fever.  HENT: Negative for ear pain and sore throat.   Eyes: Negative for visual disturbance.  Respiratory: Negative for cough and shortness of breath.   Cardiovascular: Positive for chest pain and palpitations. Negative for leg swelling.  Gastrointestinal: Negative for abdominal pain, constipation, diarrhea, nausea and vomiting.  Genitourinary: Negative for dysuria and hematuria.  Musculoskeletal: Positive for back pain (chronic).  Skin: Negative for rash.  Neurological: Positive for light-headedness. Negative for syncope.  All other systems reviewed and are negative.    Physical Exam Updated Vital Signs BP 125/72   Pulse 64   Temp 98.4 F (36.9 C) (Oral)   Resp 14   Ht 5\' 6"  (1.676 m)   Wt 59 kg   SpO2 100%   BMI 20.98 kg/m   Physical Exam Vitals signs and nursing note reviewed.  Constitutional:      General: She is not in acute distress.    Appearance: She is well-developed. She is not ill-appearing or toxic-appearing.  HENT:     Head: Normocephalic and atraumatic.  Eyes:     Conjunctiva/sclera: Conjunctivae normal.  Neck:     Musculoskeletal: Neck supple.  Cardiovascular:     Rate and Rhythm: Normal rate and regular rhythm.     Heart sounds: No murmur.  Pulmonary:     Effort: Pulmonary effort is normal. No respiratory distress.     Breath sounds: No decreased breath sounds, wheezing, rhonchi or rales.  Chest:     Chest wall: Tenderness (left mid chest ttp) present.  Abdominal:     Palpations: Abdomen is soft.     Tenderness: There is no abdominal tenderness. There is no guarding or rebound.   Musculoskeletal:     Right lower leg: She exhibits no tenderness. No edema.     Left lower leg: She exhibits no tenderness. No edema.  Skin:    General: Skin is warm and dry.  Neurological:     Mental Status: She is alert.      ED Treatments / Results  Labs (all labs ordered are listed, but only abnormal results are displayed) Labs Reviewed  BASIC METABOLIC PANEL - Abnormal; Notable for the following components:      Result Value   Potassium 3.4 (*)    Glucose, Bld 105 (*)    All other components within normal limits  CBC  TSH  TROPONIN I (HIGH SENSITIVITY)  TROPONIN I (HIGH SENSITIVITY)    EKG None  Radiology Dg Chest 2 View  Result Date: 11/24/2018 CLINICAL DATA:  Chest pain. EXAM: CHEST - 2 VIEW COMPARISON:  July 10, 2017. FINDINGS: The heart size and mediastinal contours are within normal limits. Both lungs are clear. No pneumothorax or pleural effusion is noted. The visualized skeletal structures are unremarkable. IMPRESSION: No active cardiopulmonary disease. Electronically Signed   By: Marijo Conception M.D.   On: 11/24/2018 08:17    Procedures Procedures (including critical care time)  Medications Ordered in ED Medications  sodium chloride flush (NS) 0.9 % injection 3 mL (has no administration in time range)     Initial Impression / Assessment and Plan / ED Course  I have reviewed the triage vital signs and the nursing notes.  Pertinent labs & imaging results that were available during my care of the patient were reviewed by me and considered in my medical decision making (see chart for details).      Final Clinical Impressions(s) / ED Diagnoses   Final diagnoses:  Atypical chest pain   Pt is a 63 y/o female with a h/o DDD, fibromyalgia, GERD, HLD, RA who presents to the ED today for eval of palpitations and chest pain.   VS reassuring.   CBC wnl BMP nonacute Initial trop negative, delta trop TSH is wnl  EKG NSR with occasional PVCs  CXR is  without acute findings   On reassessment patient remains stable.  I discussed the results of her work-up being reassuring.  I have low suspicion for ACS.  Symptoms seem atypical for this.  Patient low risk. HEART score 3 (history, age, risk factors (HLD)). Doubt PE or acute life threatening arrhythmia. She is requesting a muscle relaxer for her sxs. rx for robaxin given. I feel she can f/u as outpt for further w/u of her sxs. I will give ambulatory referral to cardiology. Advised on specific return precautions. She voices understanding and is in agreement with plan. All questions answered.   Discussed case with Dr. Laverta Baltimore who recommends delta trop and if negative then we will plan for referral to cardiology.   ED Discharge Orders         Ordered    Ambulatory referral to Cardiology     11/24/18 1159    methocarbamol (ROBAXIN) 500 MG tablet  At bedtime PRN     11/24/18 1200           Andreanna Mikolajczak S, PA-C 11/24/18 1205    Margette Fast, MD 11/24/18 2000

## 2018-11-24 NOTE — Discharge Instructions (Addendum)
You were given a prescription for Robaxin which is a muscle relaxer.  You should not drive, work, or operate machinery while taking this medication as it can make you very drowsy.    Please follow up with a cardiologist within 5-7 days for re-evaluation of your symptoms. You were given an ambulatory referral to cardiology and should be hearing from the office soon to make an appointment. Please return to the emergency department for any new or worsening symptoms.

## 2018-11-24 NOTE — ED Triage Notes (Signed)
Pt reports chest palpitations and burning in chest off and on for the last week. NAD at present.

## 2018-12-14 ENCOUNTER — Ambulatory Visit: Payer: Medicare Other | Admitting: Cardiology

## 2018-12-20 ENCOUNTER — Encounter: Payer: Self-pay | Admitting: Cardiovascular Disease

## 2018-12-20 ENCOUNTER — Telehealth (INDEPENDENT_AMBULATORY_CARE_PROVIDER_SITE_OTHER): Payer: Medicare Other | Admitting: Cardiovascular Disease

## 2018-12-20 VITALS — Ht 66.0 in | Wt 133.0 lb

## 2018-12-20 DIAGNOSIS — E785 Hyperlipidemia, unspecified: Secondary | ICD-10-CM

## 2018-12-20 DIAGNOSIS — R002 Palpitations: Secondary | ICD-10-CM | POA: Diagnosis not present

## 2018-12-20 DIAGNOSIS — R0789 Other chest pain: Secondary | ICD-10-CM | POA: Diagnosis not present

## 2018-12-20 DIAGNOSIS — R072 Precordial pain: Secondary | ICD-10-CM

## 2018-12-20 MED ORDER — METOPROLOL TARTRATE 100 MG PO TABS: ORAL_TABLET | ORAL | 0 refills | Status: DC

## 2018-12-20 NOTE — Progress Notes (Signed)
Cardiology Office Note   Date:  12/20/2018   ID:  Vickie Pearson [birth year], MRN [id number]  PCP:  Ladell Pier, MD  Cardiologist:   Skeet Latch, MD   No chief complaint on file.     History of Present Illness: Vickie Pearson is a 63 y.o. female who presents for Rheumatoid arthritis, MGUS, GERD, fibromyalgia, and hyperlipidemia who is being seen today for the evaluation of chest pain and palpitations at the request of Cortni Couture, PA-C.  History of Present Illness:   Vickie Pearson reports intermittent episodes of heart palpitations.  Its been occurring for the last 4 months but seems to be happening more recently.  It feels like a fluttering sensation and occurs both at rest and with exertion.  Episode lasts between 15 and 20 seconds.  She sometimes feels lightheaded or dizzy.  There is no associated shortness of breath, nausea, or diaphoresis.  It is sometimes associated with chest pressure.  It makes her feel anxious.  In the past she like to exercise with line dancing or aqua aerobics.  She also did chair yoga.  However she has not been doing this much lately due to the coronavirus.  She has no exertional chest pain or shortness of breath.  She has had intermittent episodes of chest discomfort and has been diagnosed with costochondritis, GERD, and degenerative disc disease in her neck which makes it hard for her to tell what the ultimate causes.  Vickie Pearson was seen in the ED 11/24/2018 with chest pain and palpitations.  EKG did show 1 PVC but otherwise no significant arrhythmias.  Cardiac enzymes were negative.  She did have chest wall tenderness on exam.  Her symptoms were thought to be atypical.  She was encouraged to follow-up with cardiology as an outpatient.    Past Medical History:  Diagnosis Date  . Colon polyps   . Costochondritis   . DDD (degenerative disc disease), cervical   . Degenerative arthritis of knee    Bilateral  .  Degenerative disc disease, cervical   . Degenerative disc disease, cervical   . Fibromyalgia   . GERD (gastroesophageal reflux disease)   . Hyperlipidemia   . Monoclonal gammopathy   . Multiple thyroid nodules   . Rheumatoid arthritis ([place])     No past surgical history on file.   Current Outpatient Medications  Medication Sig Dispense Refill  . cholecalciferol (VITAMIN D) 1000 units tablet Take 3,000 Units by mouth daily.    . Coenzyme Q10 100 MG TABS Take 200 mg by mouth daily.    . fluticasone (FLONASE) 50 MCG/ACT nasal spray Place 1 spray into both nostrils as needed for allergies or rhinitis.    . Magnesium Citrate 100 MG TABS Take by mouth daily.    . vitamin B-12 (CYANOCOBALAMIN) 1000 MCG tablet Take 1 tablet by mouth daily.    . vitamin C (ASCORBIC ACID) 250 MG tablet Take 500 mg by mouth daily.     Marland Kitchen zinc sulfate 220 (50 Zn) MG capsule Take by mouth 3 (three) times daily.    . metoprolol tartrate (LOPRESSOR) 100 MG tablet TAKE 1 TABLET 2 HOURS PRIOR TO YOUR CT 1 tablet 0   No current facility-administered medications for this visit.     Allergies:   Penicillins, Piroxicam, and Sulfa antibiotics    Social History:  The patient  reports that she quit smoking about 30 years ago. Her smoking use included cigarettes. She smoked 1.00 pack per day.  She has never used smokeless tobacco. She reports current alcohol use. She reports that she does not use drugs.   Family History:  The patient's family history includes Arthritis in her father and mother; Colon polyps in her mother; Heart disease in her father; Hyperlipidemia in her brother and sister; Hypertension in her brother, father, mother, and sister; Kidney disease in her father; Stroke in her mother.    ROS:  Please see the history of present illness.   Otherwise, review of systems are positive for none.   All other systems are reviewed and negative.    PHYSICAL EXAM: VS:  Ht 5\' 6"  (1.676 m)   Wt 133 lb (60.3 kg)   BMI  21.47 kg/m  , BMI Body mass index is 21.47 kg/m. GENERAL:  Well appearing HEENT:  Pupils equal round and reactive, fundi not visualized, oral mucosa unremarkable NECK:  No jugular venous distention, waveform within normal limits, carotid upstroke brisk and symmetric, no bruits, no thyromegaly LYMPHATICS:  No cervical adenopathy LUNGS:  Clear to auscultation bilaterally HEART:  RRR.  PMI not displaced or sustained,S1 and S2 within normal limits, no S3, no S4, no clicks, no rubs,  murmurs ABD:  Flat, positive bowel sounds normal in frequency in pitch, no bruits, no rebound, no guarding, no midline pulsatile mass, no hepatomegaly, no splenomegaly EXT:  2 plus pulses throughout, no edema, no cyanosis no clubbing SKIN:  No rashes no nodules NEURO:  Cranial nerves II through XII grossly intact, motor grossly intact throughout PSYCH:  Cognitively intact, oriented to person place and time   EKG:  EKG is not ordered today. The ekg ordered 11/24/18 demonstrates sinus rhythm.  Rate 81 bpm.  PVC.    Recent Labs: 11/24/2018: BUN 9; Creatinine, Ser 0.85; Hemoglobin 13.0; Platelets 269; Potassium 3.4; Sodium 139; TSH 0.935    Lipid Panel No results found for: CHOL, TRIG, HDL, CHOLHDL, VLDL, LDLCALC, LDLDIRECT    Wt Readings from Last 3 Encounters:  12/20/18 133 lb (60.3 kg)  11/24/18 130 lb (59 kg)  11/17/17 133 lb 3.2 oz (60.4 kg)      ASSESSMENT AND PLAN:  # Palpitations: EKG showed a PVC.  Her symptoms last for 15-20 minutes and occur several days per week.  We will get a 7 day event monitor to assess.  Labs were unremarkable in the ED.  # Atypical chest pain:  Symptoms are atypical.  However given her age and hyperlipidemia we will get a coronary CT-A to assess for obstructive coronary disease.  # Hyperlipidemia:  Vickie Pearson has a history of hyperlipidemia but no recent labs.  She will return for lipids and a CMP.  Current medicines are reviewed at length with the patient  today.  The patient does not have concerns regarding medicines.  The following changes have been made:  no change  Labs/ tests ordered today include:   Orders Placed This Encounter  Procedures  . CT CORONARY MORPH W/CTA COR W/SCORE W/CA W/CM &/OR WO/CM  . CT CORONARY FRACTIONAL FLOW RESERVE DATA PREP  . CT CORONARY FRACTIONAL FLOW RESERVE FLUID ANALYSIS  . Lipid Profile  . Comprehensive Metabolic Panel (CMET)  . Cardiac event monitor     Disposition:   FU with Kesa Birky C. Oval Linsey, MD, [facility] in 2 months.      Signed, Celeste Candelas C. Oval Linsey, MD, [facility]  12/20/2018 3:56 PM    [place]

## 2018-12-20 NOTE — Patient Instructions (Addendum)
Medication Instructions:  TAKE METOPROLOL 100 MG 2 HOURS PRIOR TO YOUR CARDIAC CT   *If you need a refill on your cardiac medications before your next appointment, please call your pharmacy*  Lab Work: FASTING LP/CMET 1 WEEK PRIOR TO CARDIAC CT  If you have labs (blood work) drawn today and your tests are completely normal, you will receive your results only by: Marland Kitchen MyChart Message (if you have MyChart) OR . A paper copy in the mail If you have any lab test that is abnormal or we need to change your treatment, we will call you to review the results.  Testing/Procedures: Your physician has requested that you have cardiac CT. Cardiac computed tomography (CT) is a painless test that uses an x-ray machine to take clear, detailed pictures of your heart. For further information please visit [email or web address]. Please follow instruction sheet as given. THE OFFICE WILL CALL TO SCHEDULE ONCE YOUR INSURANCE HAS APPROVED  Your physician has recommended that you wear an event monitor. Event monitors are medical devices that record the heart's electrical activity. Doctors most often [place] these monitors to diagnose arrhythmias. Arrhythmias are problems with the speed or rhythm of the heartbeat. The monitor is a small, portable device. You can wear one while you do your normal daily activities. This is usually used to diagnose what is causing palpitations/syncope (passing out). 7 DAY MONITOR SOMEONE WILL CALL YOU TO ARRANGE   IF YOU DO NOT HEAR ON EITHER OF ABOVE IN 2 WEEKS CALL [phone]  Follow-Up: At [facility], you and your health needs are our priority.  As part of our continuing mission to provide you with exceptional heart care, we have created designated Provider Care Teams.  These Care Teams include your primary Cardiologist (physician) and Advanced Practice Providers (APPs -  Physician Assistants and Nurse Practitioners) who all work together to provide you with the care you need, when you  need it.  Your next appointment:   2 MONTHS 02/22/2019 at 11:00 am in office with Dr Oval Linsey   The format for your next appointment:   In Person  Provider:   You may see DR [facility] or one of the following Advanced Practice Providers on your designated Care Team:    Kerin Ransom, PA-C  [place], [place]  Coletta Memos, [place]   Other Instructions  Your cardiac CT will be scheduled at one of the below locations:   [facility] [address] [place], [place] [address] [phone]  [place] [address] [place], [place] [address] [phone]  If scheduled at [facility], please arrive at the [facility] main entrance of [facility] 30-45 minutes prior to test start time. Proceed to the [facility] Radiology Department (first floor) to check-in and test prep.  If scheduled at [facility], please arrive 15 mins early for check-in and test prep.  Please follow these instructions carefully (unless otherwise directed):  On the Night Before the Test: . Be sure to Drink plenty of water. . Do not consume any caffeinated/decaffeinated beverages or chocolate 12 hours prior to your test. . Do not take any antihistamines 12 hours prior to your test. . If the patient has contrast allergy: ? Patient will need a prescription for Prednisone and very clear instructions (as follows): 1. Prednisone 50 mg - take 13 hours prior to test 2. Take another Prednisone 50 mg 7 hours prior to test 3. Take another Prednisone 50  mg 1 hour prior to test 4. Take Benadryl 50 mg 1 hour prior to test . Patient must complete all four doses of above prophylactic medications. . Patient will need a ride after test due to Benadryl.  On the Day of the Test: . Drink plenty of water. Do not drink any water within one hour of the test. . Do not eat any food 4 hours prior to the test. . You may take  your regular medications prior to the test.  . Take metoprolol (Lopressor) two hours prior to test. . HOLD Furosemide/Hydrochlorothiazide morning of the test. . FEMALES- please wear underwire-free bra if available      After the Test: . Drink plenty of water. . After receiving IV contrast, you may experience a mild flushed feeling. This is normal. . On occasion, you may experience a mild rash up to 24 hours after the test. This is not dangerous. If this occurs, you can take Benadryl 25 mg and increase your fluid intake. . If you experience trouble breathing, this can be serious. If it is severe call 911 IMMEDIATELY. If it is mild, please call our office. . If you take any of these medications: Glipizide/Metformin, Avandament, Glucavance, please do not take 48 hours after completing test unless otherwise instructed.   Once we have confirmed authorization from your insurance company, we will call you to set up a date and time for your test.   For non-scheduling related questions, please contact the cardiac imaging nurse navigator should you have any questions/concerns: Marchia Bond, RN Navigator Cardiac Imaging [facility] [phone] Office    Cardiac CT Angiogram  A cardiac CT angiogram is a procedure to look at the heart and the area around the heart. It may be done to help find the cause of chest pains or other symptoms of heart disease. During this procedure, a large X-ray machine, called a CT scanner, takes detailed pictures of the heart and the surrounding area after a dye (contrast material) has been injected into blood vessels in the area. The procedure is also sometimes called a coronary CT angiogram, coronary artery scanning, or CTA. A cardiac CT angiogram allows the health care provider to see how well blood is flowing to and from the heart. The health care provider will be able to see if there are any problems, such as: Blockage or narrowing of the  coronary arteries in the heart. Fluid around the heart. Signs of weakness or disease in the muscles, valves, and tissues of the heart. Tell a health care provider about: Any allergies you have. This is especially important if you have had a previous allergic reaction to contrast dye. All medicines you are taking, including vitamins, herbs, eye drops, creams, and over-the-counter medicines. Any blood disorders you have. Any surgeries you have had. Any medical conditions you have. Whether you are pregnant or may be pregnant. Any anxiety disorders, chronic pain, or other conditions you have that may increase your stress or prevent you from lying still. What are the risks? Generally, this is a safe procedure. However, problems may occur, including: Bleeding. Infection. Allergic reactions to medicines or dyes. Damage to other structures or organs. Kidney damage from the dye or contrast that is used. Increased risk of cancer from radiation exposure. This risk is low. Talk with your health care provider about: The risks and benefits of testing. How you can receive the lowest dose of radiation. What happens before the procedure? Wear comfortable clothing and  remove any jewelry, glasses, dentures, and hearing aids. Follow instructions from your health care provider about eating and drinking. This may include: For 12 hours before the test - avoid caffeine. This includes tea, coffee, soda, energy drinks, and diet pills. Drink plenty of water or other fluids that do not have caffeine in them. Being well-hydrated can prevent complications. For 4-6 hours before the test - stop eating and drinking. The contrast dye can cause nausea, but this is less likely if your stomach is empty. Ask your health care provider about changing or stopping your regular medicines. This is especially important if you are taking diabetes medicines, blood thinners, or medicines to treat erectile dysfunction. What happens  during the procedure? Hair on your chest may need to be removed so that small sticky patches called electrodes can be placed on your chest. These will transmit information that helps to monitor your heart during the test. An IV tube will be inserted into one of your veins. You might be given a medicine to control your heart rate during the test. This will help to ensure that good images are obtained. You will be asked to lie on an exam table. This table will slide in and out of the CT machine during the procedure. Contrast dye will be injected into the IV tube. You might feel warm, or you may get a metallic taste in your mouth. You will be given a medicine (nitroglycerin) to relax (dilate) the arteries in your heart. The table that you are lying on will move into the CT machine tunnel for the scan. The person running the machine will give you instructions while the scans are being done. You may be asked to: Keep your arms above your head. Hold your breath. Stay very still, even if the table is moving. When the scanning is complete, you will be moved out of the machine. The IV tube will be removed. The procedure may vary among health care providers and hospitals. What happens after the procedure? You might feel warm, or you may get a metallic taste in your mouth from the contrast dye. You may have a headache from the nitroglycerin. After the procedure, drink water or other fluids to wash (flush) the contrast material out of your body. Contact a health care provider if you have any symptoms of allergy to the contrast. These symptoms include: Shortness of breath. Rash or hives. A racing heartbeat. Most people can return to their normal activities right after the procedure. Ask your health care provider what activities are safe for you. It is up to you to get the results of your procedure. Ask your health care provider, or the department that is doing the procedure, when your results will be  ready. Summary A cardiac CT angiogram is a procedure to look at the heart and the area around the heart. It may be done to help find the cause of chest pains or other symptoms of heart disease. During this procedure, a large X-ray machine, called a CT scanner, takes detailed pictures of the heart and the surrounding area after a dye (contrast material) has been injected into blood vessels in the area. Ask your health care provider about changing or stopping your regular medicines before the procedure. This is especially important if you are taking diabetes medicines, blood thinners, or medicines to treat erectile dysfunction. After the procedure, drink water or other fluids to wash (flush) the contrast material out of your body. This information is not intended to replace  advice given to you by your health care provider. Make sure you discuss any questions you have with your health care provider. Document Released: 01/09/2008 Document Revised: 01/08/2017 Document Reviewed: 12/16/2015 Elsevier Patient Education  2020 [employer].   Ambulatory Cardiac Monitoring An ambulatory cardiac monitor is a small recording device that is used to detect abnormal heart rhythms (arrhythmias). Most monitors are connected by wires to flat, sticky disks (electrodes) that are then attached to your chest. You may need to wear a monitor if you have had symptoms such as:  Fast heartbeats (palpitations).  Dizziness.  Fainting or light-headedness.  Unexplained weakness.  Shortness of breath. There are several types of monitors. Some common monitors include:  Holter monitor. This records your heart rhythm continuously, usually for 24-48 hours.  Event (episodic) monitor. This monitor has a symptoms button, and when pushed, it will begin recording. You need to activate this monitor to record when you have a heart-related symptom.  Automatic detection monitor. This monitor will begin recording when it detects an  abnormal heartbeat. What are the risks? Generally, these devices are safe to use. However, it is possible that the skin under the electrodes will become irritated. How to prepare for monitoring Your health care provider will prepare your chest for the electrode placement and show you how to use the monitor.  Do not apply lotions to your chest before monitoring.  Follow directions on how to care for the monitor, and how to return the monitor when the testing period is complete. How to use your cardiac monitor  Follow directions about how long to wear the monitor, and if you can take the monitor off in order to shower or bathe. ? Do not let the monitor get wet. ? Do not bathe, swim, or use a hot tub while wearing the monitor.  Keep your skin clean. Do not put body lotion or moisturizer on your chest.  Change the electrodes as told by your health care provider, or any time they stop sticking to your skin. You may need to use medical tape to keep them on.  Try to put the electrodes in slightly different places on your chest to help prevent skin irritation. Follow directions from your health care provider about where to place the electrodes.  Make sure the monitor is safely clipped to your clothing or in a location close to your body as recommended by your health care provider.  If your monitor has a symptoms button, press the button to mark an event as soon as you feel a heart-related symptom, such as: ? Dizziness. ? Weakness. ? Light-headedness. ? Palpitations. ? Thumping or pounding in your chest. ? Shortness of breath. ? Unexplained weakness.  Keep a diary of your activities, such as walking, doing chores, and taking medicine. It is very important to note what you were doing when you pushed the button to record your symptoms. This will help your health care provider determine what might be contributing to your symptoms.  Send the recorded information as recommended by your health  care provider. It may take some time for your health care provider to process the results.  Change the batteries as told by your health care provider.  Keep electronic devices away from your monitor. These include: ? Tablets. ? MP3 players. ? Cell phones.  While wearing your monitor you should avoid: ? Electric blankets. ? Armed forces operational officer. ? Electric toothbrushes. ? Microwave ovens. ? Magnets. ? Metal detectors. Get help right away if:  You  have chest pain.  You have shortness of breath or extreme difficulty breathing.  You develop a very fast heartbeat that does not get better.  You develop dizziness that does not go away.  You faint or constantly feel like you are about to faint. Summary  An ambulatory cardiac monitor is a small recording device that is used to detect abnormal heart rhythms (arrhythmias).  Make sure you understand how to send the information from the monitor to your health care provider.  It is important to press the button on the monitor when you have any heart-related symptoms.  Keep a diary of your activities, such as walking, doing chores, and taking medicine. It is very important to note what you were doing when you pushed the button to record your symptoms. This will help your health care provider learn what might be causing your symptoms. This information is not intended to replace advice given to you by your health care provider. Make sure you discuss any questions you have with your health care provider. Document Released: 11/05/2007 Document Revised: 01/08/2017 Document Reviewed: 01/11/2016 Elsevier Patient Education  2020 [employer].

## 2019-01-03 ENCOUNTER — Telehealth: Payer: Self-pay | Admitting: *Deleted

## 2019-01-03 NOTE — Telephone Encounter (Signed)
[employer] to ship a 7 day cardiac event monitor to her home.  Instructions included in the monitor kit.

## 2019-01-15 ENCOUNTER — Ambulatory Visit (INDEPENDENT_AMBULATORY_CARE_PROVIDER_SITE_OTHER): Payer: Medicare Other

## 2019-01-15 DIAGNOSIS — R002 Palpitations: Secondary | ICD-10-CM

## 2019-01-15 DIAGNOSIS — R072 Precordial pain: Secondary | ICD-10-CM | POA: Diagnosis not present

## 2019-01-26 ENCOUNTER — Telehealth: Payer: Self-pay | Admitting: Hematology and Oncology

## 2019-01-26 NOTE — Telephone Encounter (Signed)
[facility] transfer to [place]. Confirmed January appointment with patient.  °

## 2019-02-07 LAB — COMPREHENSIVE METABOLIC PANEL
ALT: 13 IU/L (ref 0–32)
AST: 20 IU/L (ref 0–40)
Albumin/Globulin Ratio: 1.3 (ref 1.2–2.2)
Albumin: 4 g/dL (ref 3.8–4.8)
Alkaline Phosphatase: 85 IU/L (ref 39–117)
BUN/Creatinine Ratio: 11 — ABNORMAL LOW (ref 12–28)
BUN: 10 mg/dL (ref 8–27)
Bilirubin Total: 0.2 mg/dL (ref 0.0–1.2)
CO2: 21 mmol/L (ref 20–29)
Calcium: 9.8 mg/dL (ref 8.7–10.3)
Chloride: 104 mmol/L (ref 96–106)
Creatinine, Ser: 0.88 mg/dL (ref 0.57–1.00)
GFR calc Af Amer: 80 mL/min/{1.73_m2} (ref >59)
GFR calc non Af Amer: 70 mL/min/{1.73_m2} (ref >59)
Globulin, Total: 3.1 g/dL (ref 1.5–4.5)
Glucose: 87 mg/dL (ref 65–99)
Potassium: 4.3 mmol/L (ref 3.5–5.2)
Sodium: 142 mmol/L (ref 134–144)
Total Protein: 7.1 g/dL (ref 6.0–8.5)

## 2019-02-07 LAB — LIPID PANEL
Chol/HDL Ratio: 5.3 ratio — ABNORMAL HIGH (ref 0.0–4.4)
Cholesterol, Total: 347 mg/dL — ABNORMAL HIGH (ref 100–199)
HDL: 65 mg/dL (ref >39)
LDL Chol Calc (NIH): 251 mg/dL — ABNORMAL HIGH (ref 0–99)
Triglycerides: 162 mg/dL — ABNORMAL HIGH (ref 0–149)
VLDL Cholesterol Cal: 31 mg/dL (ref 5–40)

## 2019-02-14 NOTE — Progress Notes (Signed)
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 Lebron Conners at [facility]. M protein 0.2, kappa 10.1, lambda 9.6, ratio 1.05. Met survey 11/24/2016 showed no evidence of lytic lesions.  2) 11/10/2017: M protein 0.1, kappa 12.5, lambda 8.2, ratio 1.52.  3) 02/14/2019: establish care with Dr. Lorenso Courier   Interval History:  Vickie Pearson 64 y.o. female with medical history significant for IgA kappa MGUS  presents for a follow up visit. She was last seen by Dr. Audelia Hives at [facility] on 11/17/2017. In Vickie interim since her last visit Vickie Pearson has had no interval changes.  She has been having issues with costochondritis in Vickie left lower sternum.  She reports that for this she used to take methotrexate however this was stopped last year.  She currently takes Tylenol for pain control when it flares periodically.  She is currently scheduled for a CT scan tomorrow and will meeting with cardiology next week.  On further review she notes that she does have okay energy, and does have some sweats at night.  She does have lower back pain due to degenerative disc disease.  She also has been experiencing anxiety.  Overall she notes no changes in her urinary habits, no new bone or back pain, and reports no bleeding, bruising, or shortness of breath.  A full 10 point ROS is listed below.  MEDICAL HISTORY:  Past Medical History:  Diagnosis Date  . Colon polyps   . Costochondritis   . DDD (degenerative disc disease), cervical   . Degenerative arthritis of knee    Bilateral  . Degenerative disc disease, cervical   . Degenerative disc disease, cervical   . Fibromyalgia   . GERD (gastroesophageal reflux disease)   . Hyperlipidemia   . Monoclonal gammopathy   .  Multiple thyroid nodules   . Rheumatoid arthritis ([place])     SURGICAL HISTORY: No past surgical history on file.  SOCIAL HISTORY: Social History   Socioeconomic History  . Marital status: Widowed    Spouse name: Not on file  . Number of children: Not on file  . Years of education: Not on file  . Highest education level: Not on file  Occupational History  . Not on file  Tobacco Use  . Smoking status: Former Smoker    Packs/day: 1.00    Types: Cigarettes    Quit date: 07/24/1988    Years since quitting: 30.5  . Smokeless tobacco: Never Used  Substance and Sexual Activity  . Alcohol use: Yes    Comment: occasionally  . Drug use: No  . Sexual activity: Not on file  Other Topics Concern  . Not on file  Social History Narrative  . Not on file   Social Determinants of Health   Financial Resource Strain:   . Difficulty of Paying Living Expenses: Not on file  Food Insecurity:   . Worried About Charity fundraiser in [place] Last Year: Not on file  . Ran Out of Food in [place] Last Year: Not on file  Transportation Needs:   . Lack of Transportation (Medical): Not on file  . Lack of Transportation (Non-Medical): Not on file  Physical Activity:   . Days of Exercise per Week: Not on file  . Minutes of Exercise per Session: Not on file  Stress:   . Feeling of Stress : Not on file  Social Connections:   . Frequency of Communication with Friends and Family: Not on file  . Frequency of Social Gatherings with Friends and Family: Not on file  . Attends Religious Services: Not on file  . Active Member of Clubs or Organizations: Not on file  . Attends Archivist Meetings: Not on file  . Marital Status: Not on file  Intimate Partner Violence:   . Fear of Current or Ex-Partner: Not on file  . Emotionally Abused: Not on file  . Physically Abused: Not on file  . Sexually Abused: Not on file    FAMILY HISTORY: Family History  Problem Relation Age of Onset  . Hypertension  Mother   . Colon polyps Mother   . Stroke Mother   . Arthritis Mother   . Hypertension Father   . Heart disease Father   . Arthritis Father   . Kidney disease Father   . Hypertension Sister   . Hyperlipidemia Sister   . Hypertension Brother   . Hyperlipidemia Brother     ALLERGIES:  is allergic to penicillins; piroxicam; and sulfa antibiotics.  MEDICATIONS:  Current Outpatient Medications  Medication Sig Dispense Refill  . cholecalciferol (VITAMIN D) 1000 units tablet Take 3,000 Units by mouth daily.    . Coenzyme Q10 100 MG TABS Take 200 mg by mouth daily.    . fluticasone (FLONASE) 50 MCG/ACT nasal spray Place 1 spray into both nostrils as needed for allergies or rhinitis.    . Magnesium Citrate 100 MG TABS Take by mouth daily.    . metoprolol tartrate (LOPRESSOR) 100 MG tablet TAKE 1 TABLET 2 HOURS PRIOR TO YOUR CT 1 tablet 0  . vitamin B-12 (CYANOCOBALAMIN) 1000 MCG tablet Take 1 tablet by mouth daily.    . vitamin C (ASCORBIC ACID) 250 MG tablet Take 500 mg by mouth daily.     Marland Kitchen zinc sulfate 220 (50 Zn) MG capsule Take by mouth 3 (three) times daily.     No current facility-administered medications for this visit.    REVIEW OF SYSTEMS:   Constitutional: ( - ) fevers, ( - )  chills , ( + ) night sweats Eyes: ( - ) blurriness of vision, ( - ) double vision, ( - ) watery eyes Ears, nose, mouth, throat, and face: ( - ) mucositis, ( - ) sore throat Respiratory: ( - ) cough, ( - ) dyspnea, ( - ) wheezes Cardiovascular: ( - ) palpitation, ( + ) chest discomfort, ( - ) lower extremity swelling Gastrointestinal:  ( - ) nausea, ( - ) heartburn, ( - ) change in bowel habits Skin: ( - ) abnormal skin rashes Lymphatics: ( - ) new lymphadenopathy, ( - ) easy bruising Neurological: ( - ) numbness, ( - ) tingling, ( - ) new weaknesses Behavioral/Psych: ( - ) mood change, ( - ) new changes  All other systems were reviewed with Vickie Pearson and are negative.  PHYSICAL EXAMINATION: ECOG  PERFORMANCE STATUS: 0 - Asymptomatic  Vitals:   02/15/19 1101  BP: 124/79  Pulse: 79  Resp: 17  Temp: (!) 97 F (36.1 C)  SpO2: 100%   Filed Weights   02/15/19 1101  Weight: 143 lb 3.2 oz (65 kg)    GENERAL: well appearing middle aged [place] American female. alert, no distress and comfortable SKIN: skin color, texture, turgor are normal, no rashes or significant lesions EYES: conjunctiva are pink  and non-injected, sclera clear LUNGS: clear to auscultation and percussion with normal breathing effort HEART: regular rate & rhythm and no murmurs and no lower extremity edema ABDOMEN: soft, non-tender, non-distended, normal bowel sounds Musculoskeletal: no cyanosis of digits and no clubbing  PSYCH: alert & oriented x 3, fluent speech NEURO: no focal motor/sensory deficits  LABORATORY DATA:  I have reviewed Vickie data as listed Recent Results (from Vickie past 2160 hour(s))  Basic metabolic panel     Status: Abnormal   Collection Time: 11/24/18  7:24 AM  Result Value Ref Range   Sodium 139 135 - 145 mmol/L   Potassium 3.4 (L) 3.5 - 5.1 mmol/L   Chloride 105 98 - 111 mmol/L   CO2 24 22 - 32 mmol/L   Glucose, Bld 105 (H) 70 - 99 mg/dL   BUN 9 8 - 23 mg/dL   Creatinine, Ser 0.85 0.44 - 1.00 mg/dL   Calcium 9.7 8.9 - 10.3 mg/dL   GFR calc non Af Amer >60 >60 mL/min   GFR calc Af Amer >60 >60 mL/min   Anion gap 10 5 - 15    Comment: Performed at [facility], [place] [address] [place], [place] [address]  CBC     Status: None   Collection Time: 11/24/18  7:24 AM  Result Value Ref Range   WBC 4.5 4.0 - 10.5 K/uL   RBC 4.26 3.87 - 5.11 MIL/uL   Hemoglobin 13.0 12.0 - 15.0 g/dL   HCT 40.9 36.0 - 46.0 %   MCV 96.0 80.0 - 100.0 fL   MCH 30.5 26.0 - 34.0 pg   MCHC 31.8 30.0 - 36.0 g/dL   RDW 13.6 11.5 - 15.5 %   Platelets 269 150 - 400 K/uL   nRBC 0.0 0.0 - 0.2 %    Comment: Performed at [facility], [place] [address] [place], [place] [address]  Troponin I (High  Sensitivity)     Status: None   Collection Time: 11/24/18  7:24 AM  Result Value Ref Range   Troponin I (High Sensitivity) <2 <18 ng/L    Comment: Performed at [facility] [address], [place], [place] [address]  Troponin I (High Sensitivity)     Status: None   Collection Time: 11/24/18 10:11 AM  Result Value Ref Range   Troponin I (High Sensitivity) 3 <18 ng/L    Comment: (NOTE) Elevated high sensitivity troponin I (hsTnI) values and significant  changes across serial measurements may suggest ACS but many other  chronic and acute conditions are known to elevate hsTnI results.  Refer to Vickie "Links" section for chest pain algorithms and additional  guidance. Performed at [facility], [place] [address], [place], [place] [address]   TSH     Status: None   Collection Time: 11/24/18 10:30 AM  Result Value Ref Range   TSH 0.935 0.350 - 4.500 uIU/mL    Comment: Performed by a 3rd Generation assay with a functional sensitivity of <=0.01 uIU/mL. Performed at [facility], [place] [address], [place],  [address]   Lipid Profile     Status: Abnormal   Collection Time: 02/06/19 11:28 AM  Result Value Ref Range   Cholesterol, Total 347 (H) 100 - 199 mg/dL   Triglycerides 162 (H) 0 - 149 mg/dL   HDL 65 >39 mg/dL   VLDL Cholesterol Cal 31 5 - 40 mg/dL   LDL Chol Calc (NIH) 251 (H) 0 - 99  mg/dL   Chol/HDL Ratio 5.3 (H) 0.0 - 4.4 ratio    Comment:                                   T. Chol/HDL Ratio                                             Men  Women                               1/2 Avg.Risk  3.4    3.3                                   Avg.Risk  5.0    4.4                                2X Avg.Risk  9.6    7.1                                3X Avg.Risk 23.4   11.0   Comprehensive Metabolic Panel (CMET)     Status: Abnormal   Collection Time: 02/06/19 11:28 AM  Result Value Ref Range   Glucose 87 65 - 99 mg/dL   BUN 10 8 - 27 mg/dL   Creatinine, Ser 0.88 0.57 -  1.00 mg/dL   GFR calc non Af Amer 70 >59 mL/min/1.73   GFR calc Af Amer 80 >59 mL/min/1.73   BUN/Creatinine Ratio 11 (L) 12 - 28   Sodium 142 134 - 144 mmol/L   Potassium 4.3 3.5 - 5.2 mmol/L   Chloride 104 96 - 106 mmol/L   CO2 21 20 - 29 mmol/L   Calcium 9.8 8.7 - 10.3 mg/dL   Total Protein 7.1 6.0 - 8.5 g/dL   Albumin 4.0 3.8 - 4.8 g/dL   Globulin, Total 3.1 1.5 - 4.5 g/dL   Albumin/Globulin Ratio 1.3 1.2 - 2.2   Bilirubin Total 0.2 0.0 - 1.2 mg/dL   Alkaline Phosphatase 85 39 - 117 IU/L   AST 20 0 - 40 IU/L   ALT 13 0 - 32 IU/L  Lactate dehydrogenase (LDH)     Status: None   Collection Time: 02/15/19 10:53 AM  Result Value Ref Range   LDH 169 98 - 192 U/L    Comment: Performed at [facility], [address], [place], [place] [address]  CMP ([place] only)     Status: None   Collection Time: 02/15/19 10:53 AM  Result Value Ref Range   Sodium 140 135 - 145 mmol/L   Potassium 3.8 3.5 - 5.1 mmol/L   Chloride 106 98 - 111 mmol/L   CO2 28 22 - 32 mmol/L   Glucose, Bld 93 70 - 99 mg/dL   BUN 10 8 - 23 mg/dL   Creatinine 0.84 0.44 - 1.00 mg/dL   Calcium 9.6 8.9 - 10.3 mg/dL   Total Protein 7.8 6.5 - 8.1 g/dL   Albumin 4.0 3.5 - 5.0 g/dL   AST 15 15 - 41  U/L   ALT 10 0 - 44 U/L   Alkaline Phosphatase 81 38 - 126 U/L   Total Bilirubin 0.3 0.3 - 1.2 mg/dL   GFR, Est Non Af Am >60 >60 mL/min   GFR, Est AFR Am >60 >60 mL/min   Anion gap 6 5 - 15    Comment: Performed at [facility], [place] [address] [place], [place] [address]  CBC with Differential ([place] Only)     Status: None   Collection Time: 02/15/19 10:53 AM  Result Value Ref Range   WBC Count 5.8 4.0 - 10.5 K/uL   RBC 4.31 3.87 - 5.11 MIL/uL   Hemoglobin 13.3 12.0 - 15.0 g/dL   HCT 40.9 36.0 - 46.0 %   MCV 94.9 80.0 - 100.0 fL   MCH 30.9 26.0 - 34.0 pg   MCHC 32.5 30.0 - 36.0 g/dL   RDW 14.4 11.5 - 15.5 %   Platelet Count 278 150 - 400 K/uL   nRBC 0.0  0.0 - 0.2 %   Neutrophils Relative % 44 %   Neutro Abs 2.6 1.7 - 7.7 K/uL   Lymphocytes Relative 48 %   Lymphs Abs 2.7 0.7 - 4.0 K/uL   Monocytes Relative 6 %   Monocytes Absolute 0.4 0.1 - 1.0 K/uL   Eosinophils Relative 2 %   Eosinophils Absolute 0.1 0.0 - 0.5 K/uL   Basophils Relative 0 %   Basophils Absolute 0.0 0.0 - 0.1 K/uL   Immature Granulocytes 0 %   Abs Immature Granulocytes 0.01 0.00 - 0.07 K/uL    Comment: Performed at [facility], [place] [address], [place], [place] [address]    RADIOGRAPHIC STUDIES: None relevant to review.  ASSESSMENT & PLAN Vickie Pearson 64 y.o. female with medical history significant for IgA kappa MGUS  presents for a follow up visit. She was last seen by Dr. Audelia Hives at [facility] on 11/17/2017.  After review of Vickie labs and discussion with Vickie Pearson, her finding is most consistent with monoclonal gammopathy of undetermined significance.  It appears at this has been very stable over Vickie last 2 years, with a very low M protein.  Given these findings I think would be reasonable to continue to observe on a yearly basis with SPEP, UPEP, and serum free light chains.  Technically Vickie Pearson meets Vickie criteria for a bone marrow biopsy with an IgA MGUS, however given Vickie extremely low levels of M protein and Vickie lack of any anemia or increased calcium I think that observation would be very appropriate at this time.  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 --yearly metastatic survey is due (last in 2019) --M protein is only mildly elevated and Hgb/Ca/Cr are WNL. As such I recommend holding on a bone marrow biopsy at this time. --RTC in 1 years time for repeat serologies and metastatic survey.   Orders Placed This Encounter  Procedures  . CBC with Differential ([facility] Center Only)    Standing Status:   Future    Number of Occurrences:   1    Standing Expiration Date:    02/15/2020  . CMP ([place] only)    Standing Status:   Future    Number of Occurrences:   1    Standing Expiration Date:   02/15/2020  . Lactate dehydrogenase (LDH)    Standing Status:   Future    Number of Occurrences:   1  Standing Expiration Date:   02/15/2020  . Multiple Myeloma Panel (SPEP&IFE w/QIG)    Standing Status:   Future    Number of Occurrences:   1    Standing Expiration Date:   02/15/2020  . Kappa/lambda light chains    Standing Status:   Future    Number of Occurrences:   1    Standing Expiration Date:   02/15/2020  . 24-Hr Ur UPEP/UIFE/Light Chains/TP    Standing Status:   Future    Standing Expiration Date:   02/15/2020    All questions were answered. Vickie Pearson knows to call Vickie clinic with any problems, questions or concerns.  A total of more than 40 minutes were spent on this encounter and over half of that time was spent on counseling and coordination of care as outlined above.   Ledell Peoples, MD Department of Hematology/Oncology [place] [facility] Phone: [phone] Pager: [phone] Email: [email or web address]  02/15/2019 5:27 PM   Literature Support:  1) Kyle RA, Durie BG, Rajkumar SV, et al. Monoclonal gammopathy of undetermined significance (MGUS) and smoldering (asymptomatic) multiple myeloma: IMWG consensus perspectives risk factors for progression and guidelines for monitoring and management. Leukemia. 2010;24(6):1121-1127. doi:10.1038/leu.2010.60   --If a Pearson with apparent MGUS has a serum monoclonal protein >15 g/l, IgA or IgM protein type, or an abnormal FLC ratio, a BM aspirate and biopsy should be carried out at baseline to rule out underlying PC malignancy.   2) Marylyn Ishihara RA, Therneau TM, Rajkumar SV, et al. Prevalence of monoclonal gammopathy of undetermined significance. N Engl J Med 1505;[id number]:9480-1655   --MGUS was found in 3.2 percent of persons 30 years of age or older and 5.3 percent of persons 65  years of age or older.

## 2019-02-15 ENCOUNTER — Encounter (HOSPITAL_COMMUNITY): Payer: Self-pay

## 2019-02-15 ENCOUNTER — Inpatient Hospital Stay: Payer: Medicare Other

## 2019-02-15 ENCOUNTER — Telehealth (HOSPITAL_COMMUNITY): Payer: Self-pay | Admitting: Emergency Medicine

## 2019-02-15 ENCOUNTER — Other Ambulatory Visit: Payer: Self-pay

## 2019-02-15 ENCOUNTER — Inpatient Hospital Stay: Payer: Medicare Other | Attending: Hematology and Oncology | Admitting: Hematology and Oncology

## 2019-02-15 VITALS — BP 124/79 | HR 79 | Temp 97.0°F | Resp 17 | Ht 66.0 in | Wt 143.2 lb

## 2019-02-15 DIAGNOSIS — D472 Monoclonal gammopathy: Secondary | ICD-10-CM | POA: Diagnosis present

## 2019-02-15 DIAGNOSIS — M545 Low back pain: Secondary | ICD-10-CM | POA: Diagnosis not present

## 2019-02-15 DIAGNOSIS — R61 Generalized hyperhidrosis: Secondary | ICD-10-CM | POA: Insufficient documentation

## 2019-02-15 DIAGNOSIS — F419 Anxiety disorder, unspecified: Secondary | ICD-10-CM | POA: Insufficient documentation

## 2019-02-15 DIAGNOSIS — Z87891 Personal history of nicotine dependence: Secondary | ICD-10-CM | POA: Diagnosis not present

## 2019-02-15 LAB — CBC WITH DIFFERENTIAL (CANCER CENTER ONLY)
Abs Immature Granulocytes: 0.01 10*3/uL (ref 0.00–0.07)
Basophils Absolute: 0 10*3/uL (ref 0.0–0.1)
Basophils Relative: 0 %
Eosinophils Absolute: 0.1 10*3/uL (ref 0.0–0.5)
Eosinophils Relative: 2 %
HCT: 40.9 % (ref 36.0–46.0)
Hemoglobin: 13.3 g/dL (ref 12.0–15.0)
Immature Granulocytes: 0 %
Lymphocytes Relative: 48 %
Lymphs Abs: 2.7 10*3/uL (ref 0.7–4.0)
MCH: 30.9 pg (ref 26.0–34.0)
MCHC: 32.5 g/dL (ref 30.0–36.0)
MCV: 94.9 fL (ref 80.0–100.0)
Monocytes Absolute: 0.4 10*3/uL (ref 0.1–1.0)
Monocytes Relative: 6 %
Neutro Abs: 2.6 10*3/uL (ref 1.7–7.7)
Neutrophils Relative %: 44 %
Platelet Count: 278 10*3/uL (ref 150–400)
RBC: 4.31 MIL/uL (ref 3.87–5.11)
RDW: 14.4 % (ref 11.5–15.5)
WBC Count: 5.8 10*3/uL (ref 4.0–10.5)
nRBC: 0 % (ref 0.0–0.2)

## 2019-02-15 LAB — CMP (CANCER CENTER ONLY)
ALT: 10 U/L (ref 0–44)
AST: 15 U/L (ref 15–41)
Albumin: 4 g/dL (ref 3.5–5.0)
Alkaline Phosphatase: 81 U/L (ref 38–126)
Anion gap: 6 (ref 5–15)
BUN: 10 mg/dL (ref 8–23)
CO2: 28 mmol/L (ref 22–32)
Calcium: 9.6 mg/dL (ref 8.9–10.3)
Chloride: 106 mmol/L (ref 98–111)
Creatinine: 0.84 mg/dL (ref 0.44–1.00)
GFR, Est AFR Am: >60 mL/min (ref >60)
GFR, Estimated: >60 mL/min (ref >60)
Glucose, Bld: 93 mg/dL (ref 70–99)
Potassium: 3.8 mmol/L (ref 3.5–5.1)
Sodium: 140 mmol/L (ref 135–145)
Total Bilirubin: 0.3 mg/dL (ref 0.3–1.2)
Total Protein: 7.8 g/dL (ref 6.5–8.1)

## 2019-02-15 LAB — LACTATE DEHYDROGENASE: LDH: 169 U/L (ref 98–192)

## 2019-02-15 NOTE — Telephone Encounter (Signed)
Left message on voicemail with name and callback number Daymond Cordts RN Navigator Cardiac Imaging [facility] [phone] Office [phone] Cell  

## 2019-02-16 ENCOUNTER — Ambulatory Visit (HOSPITAL_COMMUNITY)
Admission: RE | Admit: 2019-02-16 | Discharge: 2019-02-16 | Disposition: A | Discharge status: Home / Self Care | Payer: Medicare Other | Source: Ambulatory Visit | Attending: Cardiology | Admitting: Cardiology

## 2019-02-16 DIAGNOSIS — R072 Precordial pain: Secondary | ICD-10-CM | POA: Diagnosis present

## 2019-02-16 DIAGNOSIS — R002 Palpitations: Secondary | ICD-10-CM | POA: Insufficient documentation

## 2019-02-16 DIAGNOSIS — Z006 Encounter for examination for normal comparison and control in clinical research program: Secondary | ICD-10-CM

## 2019-02-16 LAB — KAPPA/LAMBDA LIGHT CHAINS
Kappa free light chain: 17.5 mg/L (ref 3.3–19.4)
Kappa, lambda light chain ratio: 1.46 (ref 0.26–1.65)
Lambda free light chains: 12 mg/L (ref 5.7–26.3)

## 2019-02-16 MED ORDER — IOHEXOL 350 MG/ML SOLN
80.0000 mL | Freq: Once | INTRAVENOUS | Status: AC | PRN
  Administered 2019-02-16: 13:00:00 80 mL via INTRAVENOUS

## 2019-02-16 MED ORDER — NITROGLYCERIN 0.4 MG SL SUBL: 0.8000 mg | SUBLINGUAL_TABLET | Freq: Once | SUBLINGUAL | Status: DC

## 2019-02-16 MED ORDER — NITROGLYCERIN 0.4 MG SL SUBL
SUBLINGUAL_TABLET | SUBLINGUAL | Status: AC
  Filled 2019-02-16: qty 2

## 2019-02-16 NOTE — Research (Signed)
Cadfem Informed Consent    Patient Name: Vickie Pearson   Subject met inclusion and exclusion criteria.  The informed consent form, study requirements and expectations were reviewed with the subject and questions and concerns were addressed prior to the signing of the consent form.  The subject verbalized understanding of the trail requirements.  The subject agreed to participate in the CADFEM trial and signed the informed consent.  The informed consent was obtained prior to performance of any protocol-specific procedures for the subject.  A copy of the signed informed consent was given to the subject and a copy was placed in the subject's medical record.    S    

## 2019-02-17 LAB — MULTIPLE MYELOMA PANEL, SERUM
Albumin SerPl Elph-Mcnc: 3.9 g/dL (ref 2.9–4.4)
Albumin/Glob SerPl: 1.2 (ref 0.7–1.7)
Alpha 1: 0.2 g/dL (ref 0.0–0.4)
Alpha2 Glob SerPl Elph-Mcnc: 0.8 g/dL (ref 0.4–1.0)
B-Globulin SerPl Elph-Mcnc: 1.1 g/dL (ref 0.7–1.3)
Gamma Glob SerPl Elph-Mcnc: 1.3 g/dL (ref 0.4–1.8)
Globulin, Total: 3.4 g/dL (ref 2.2–3.9)
IgA: 308 mg/dL (ref 87–352)
IgG (Immunoglobin G), Serum: 1354 mg/dL (ref 586–1602)
IgM (Immunoglobulin M), Srm: 55 mg/dL (ref 26–217)
M Protein SerPl Elph-Mcnc: 0.2 g/dL — ABNORMAL HIGH
Total Protein ELP: 7.3 g/dL (ref 6.0–8.5)

## 2019-02-20 ENCOUNTER — Telehealth: Payer: Self-pay | Admitting: Cardiovascular Disease

## 2019-02-20 NOTE — Telephone Encounter (Signed)
Spoke with patient and moved appointment to tomorrow

## 2019-02-20 NOTE — Telephone Encounter (Signed)
New Message  Patient c/o Palpitations:  High priority if patient c/o lightheadedness, shortness of breath, or chest pain  1) How long have you had palpitations/irregular HR/ Afib? Are you having the symptoms now? Since October   2) Are you currently experiencing lightheadedness, SOB or CP? No  3) Do you have a history of afib (atrial fibrillation) or irregular heart rhythm? No  4) Have you checked your BP or HR? (document readings if available): No  5) Are you experiencing any other symptoms? Heart is constantly racing. When turning in back when it happen

## 2019-02-20 NOTE — Telephone Encounter (Signed)
Returned call to pt she states that she has been palpitations and racing heart for 1-2 months. She states that she has not taken her BP.  She states that she does not have BP cuff.  She does have an appointment to discuss this with her this week. She states that she would like an rx for some cholesterol medication. Informed pt that she can discuss this at her appt. She would like Dr Oval Linsey to call her before her appt. Informed pt that we usually send message. She would like to start before her appt. Please advise.

## 2019-02-21 ENCOUNTER — Encounter: Payer: Self-pay | Admitting: Cardiovascular Disease

## 2019-02-21 ENCOUNTER — Other Ambulatory Visit: Payer: Self-pay

## 2019-02-21 ENCOUNTER — Ambulatory Visit (INDEPENDENT_AMBULATORY_CARE_PROVIDER_SITE_OTHER): Payer: Medicare Other | Admitting: Cardiovascular Disease

## 2019-02-21 VITALS — BP 127/81 | HR 77 | Temp 97.5°F | Ht 66.0 in | Wt 141.6 lb

## 2019-02-21 DIAGNOSIS — R000 Tachycardia, unspecified: Secondary | ICD-10-CM | POA: Diagnosis not present

## 2019-02-21 DIAGNOSIS — I2510 Atherosclerotic heart disease of native coronary artery without angina pectoris: Secondary | ICD-10-CM | POA: Diagnosis not present

## 2019-02-21 DIAGNOSIS — E7849 Other hyperlipidemia: Secondary | ICD-10-CM

## 2019-02-21 DIAGNOSIS — I4711 Inappropriate sinus tachycardia, so stated: Secondary | ICD-10-CM | POA: Insufficient documentation

## 2019-02-21 DIAGNOSIS — I2584 Coronary atherosclerosis due to calcified coronary lesion: Secondary | ICD-10-CM | POA: Diagnosis not present

## 2019-02-21 HISTORY — DX: Inappropriate sinus tachycardia, so stated: I47.11

## 2019-02-21 HISTORY — DX: Tachycardia, unspecified: R00.0

## 2019-02-21 HISTORY — DX: Atherosclerotic heart disease of native coronary artery without angina pectoris: I25.10

## 2019-02-21 MED ORDER — METOPROLOL SUCCINATE ER 50 MG PO TB24: 50.0000 mg | ORAL_TABLET | Freq: Every day | ORAL | 3 refills | Status: DC

## 2019-02-21 NOTE — Patient Instructions (Addendum)
Medication Instructions:  START METOPROLOL SUC 50 MG DAILY   START ASPIRIN 81 MG DAILY   *If you need a refill on your cardiac medications before your next appointment, please call your pharmacy*  Lab Work: NONE   Testing/Procedures: NONE   Follow-Up: At [facility], you and your health needs are our priority.  As part of our continuing mission to provide you with exceptional heart care, we have created designated Provider Care Teams.  These Care Teams include your primary Cardiologist (physician) and Advanced Practice Providers (APPs -  Physician Assistants and Nurse Practitioners) who all work together to provide you with the care you need, when you need it.  Your next appointment:   2 month(s)  The format for your next appointment:   Virtual Visit   Provider:   You may see DR [facility] or one of the following Advanced Practice Providers on your designated Care Team:    Kerin Ransom, PA-C  [place], [place]  Coletta Memos, [place]   You have been referred to [facility] FOR PSK9/CHOLESTEROL DISCUSSION

## 2019-02-21 NOTE — Progress Notes (Signed)
Cardiology Office Note   Date:  02/21/2019   ID:  Vickie Pearson, DOB [birth year], MRN [id number]  PCP:  Vickie Pier, MD  Cardiologist:   Vickie Latch, MD   No chief complaint on file.   History of Present Illness: Vickie Pearson is a 64 y.o. female who presents for Rheumatoid arthritis, MGUS, GERD, fibromyalgia, and familial hyperlipidemia who is being seen today for the evaluation of chest pain and palpitations at the request of Vickie Couture, PA-C.  History of Present Illness:   Vickie Pearson was initially seen virtually 12/2018 with reports intermittent episodes of heart palpitations.  It was ongoing for the preceding 4 months but seemed to be happening more recently.  It feels like a fluttering sensation and occurs both at rest and with exertion.  Episode lasts between 15 and 20 seconds.  She sometimes feels lightheaded or dizzy.  Vickie Pearson was seen in the ED 11/24/2018 with chest pain and palpitations.  EKG did show 1 PVC but otherwise no significant arrhythmias.  Cardiac enzymes were negative.  She wore a 7-day event monitor that revealed no arrhythmias.  However she was noted to be tachycardic to the 140s at rest.  She reported atypical chest pain and had a coronary CT-a 02/2019 that revealed minimal obstruction.  Her calcium score was 50.3, which is 80th percentile for age and gender.  She hasn't been experiencing chest pain recently.  She continues to have heart fluttering that is worse at night.  The heart fluttering does not occur with exertion.  She has no exertional chest pain or shortness of breath.  Vickie Pearson sister has very elevated lipids.  After her last appointment she had lipids checked and her LDL was 251.  She reports that her diet is good. She hasn't been exercising as much as usual lately 2/2 COVID-19.  She tried taking pravastatin in the past but developed myalgias.   Past Medical History:  Diagnosis Date  . Colon  polyps   . Coronary artery calcification of native artery 02/21/2019   Minimal calcification noted 11/2018 on coronary CTA  . Costochondritis   . DDD (degenerative disc disease), cervical   . Degenerative arthritis of knee    Bilateral  . Degenerative disc disease, cervical   . Degenerative disc disease, cervical   . Fibromyalgia   . GERD (gastroesophageal reflux disease)   . Hyperlipidemia   . Inappropriate sinus tachycardia 02/21/2019  . Monoclonal gammopathy   . Multiple thyroid nodules   . Rheumatoid arthritis ([place])     History reviewed. No pertinent surgical history.   Current Outpatient Medications  Medication Sig Dispense Refill  . aspirin EC 81 MG tablet Take 81 mg by mouth daily.    . cholecalciferol (VITAMIN D) 1000 units tablet Take 3,000 Units by mouth daily.    . Coenzyme Q10 100 MG TABS Take 200 mg by mouth daily.    . fluticasone (FLONASE) 50 MCG/ACT nasal spray Place 1 spray into both nostrils as needed for allergies or rhinitis.    . Magnesium Citrate 100 MG TABS Take by mouth daily.    . vitamin B-12 (CYANOCOBALAMIN) 1000 MCG tablet Take 1 tablet by mouth daily.    . vitamin C (ASCORBIC ACID) 250 MG tablet Take 500 mg by mouth daily.     Marland Kitchen zinc sulfate 220 (50 Zn) MG capsule Take by mouth 3 (three) times daily.    . metoprolol succinate (TOPROL-XL) 50 MG 24 hr tablet Take 1 tablet (50  mg total) by mouth daily. Take with or immediately following a meal. 90 tablet 3   No current facility-administered medications for this visit.    Allergies:   Penicillins, Piroxicam, and Sulfa antibiotics    Social History:  The patient  reports that she quit smoking about 30 years ago. Her smoking use included cigarettes. She smoked 1.00 pack per day. She has never used smokeless tobacco. She reports current alcohol use. She reports that she does not use drugs.   Family History:  The patient's family history includes Arthritis in her father and mother; Colon polyps in her  mother; Heart disease in her father; Hyperlipidemia in her brother and sister; Hypertension in her brother, father, mother, and sister; Kidney disease in her father; Stroke in her mother.    ROS:  Please see the history of present illness.   Otherwise, review of systems are positive for none.   All other systems are reviewed and negative.    PHYSICAL EXAM: VS:  BP 127/81   Pulse 77   Temp (!) 97.5 F (36.4 C)   Ht 5\' 6"  (1.676 m)   Wt 141 lb 9.6 oz (64.2 kg)   SpO2 100%   BMI 22.85 kg/m  , BMI Body mass index is 22.85 kg/m. GENERAL:  Well appearing HEENT:  Pupils equal round and reactive, fundi not visualized, oral mucosa unremarkable NECK:  No jugular venous distention, waveform within normal limits, carotid upstroke brisk and symmetric, no bruits, no thyromegaly LYMPHATICS:  No cervical adenopathy LUNGS:  Clear to auscultation bilaterally HEART:  RRR.  PMI not displaced or sustained,S1 and S2 within normal limits, no S3, no S4, no clicks, no rubs, no  murmurs ABD:  Flat, positive bowel sounds normal in frequency in pitch, no bruits, no rebound, no guarding, no midline pulsatile mass, no hepatomegaly, no splenomegaly EXT:  2 plus pulses throughout, no edema, no cyanosis no clubbing SKIN:  No rashes no nodules NEURO:  Cranial nerves II through XII grossly intact, motor grossly intact throughout PSYCH:  Cognitively intact, oriented to person place and time   EKG:  EKG is not ordered today. The ekg ordered 11/24/18 demonstrates sinus rhythm.  Rate 81 bpm.  PVC.    Recent Labs: 11/24/2018: TSH 0.935 02/15/2019: ALT 10; BUN 10; Creatinine 0.84; Hemoglobin 13.3; Platelet Count 278; Potassium 3.8; Sodium 140    Lipid Panel    Component Value Date/Time   CHOL 347 (H) 02/06/2019 1128   TRIG 162 (H) 02/06/2019 1128   HDL 65 02/06/2019 1128   CHOLHDL 5.3 (H) 02/06/2019 1128   LDLCALC 251 (H) 02/06/2019 1128      Wt Readings from Last 3 Encounters:  02/21/19 141 lb 9.6 oz (64.2  kg)  02/15/19 143 lb 3.2 oz (65 kg)  12/20/18 133 lb (60.3 kg)      ASSESSMENT AND PLAN:  # Palpitations: # Inappropriate sinus tachycardia: Vickie Pearson wore a monitor that did not show any arrhythmias.  However she did have sinus tachycardia up to the 140s at rest.  I suspect that this is the cause of her palpitations.  She has GERD which may be contributing.  She notes that on the day she took her metoprolol prior to cardiac CT she felt much better.  We will try metoprolol succinate 50 mg daily.  Labs were unremarkable in the ED.  # Non-obstructive CAD: # Familial hyperlipidemia: Ms. Burnes had minimal coronary artery disease on cardiac CT-a, but her calcium score was 80th percentile.  Her LDL was 251 with a total cholesterol 347.  She reports several other family members have very elevated cholesterol.  I suspect that this is due to familial hyperlipidemia.  Given that she had myalgias on very low-dose pravastatin, it is very unlikely she is going to tolerate atorvastatin or rosuvastatin.  She is unwilling to try.  We will refer her to our pharmacist to be started on either Praluent or Repatha.   Current medicines are reviewed at length with the patient today.  The patient does not have concerns regarding medicines.  The following changes have been made: Start metoprolol and PCSK9 inhibitor.  Labs/ tests ordered today include:   No orders of the defined types were placed in this encounter.    Disposition:   FU with Elmore Hyslop C. Oval Linsey, MD, [facility] in 2 months.      Signed, Daronte Shostak C. Oval Linsey, MD, [facility]  02/21/2019 6:09 PM    [place]

## 2019-02-22 ENCOUNTER — Ambulatory Visit: Payer: Medicare Other | Admitting: Cardiovascular Disease

## 2019-02-23 ENCOUNTER — Ambulatory Visit (HOSPITAL_COMMUNITY)
Admission: RE | Admit: 2019-02-23 | Discharge: 2019-02-23 | Disposition: A | Discharge status: Home / Self Care | Payer: Medicare Other | Source: Ambulatory Visit | Attending: Hematology and Oncology | Admitting: Hematology and Oncology

## 2019-02-23 ENCOUNTER — Other Ambulatory Visit: Payer: Self-pay

## 2019-02-23 ENCOUNTER — Telehealth: Payer: Self-pay | Admitting: Hematology and Oncology

## 2019-02-23 DIAGNOSIS — D472 Monoclonal gammopathy: Secondary | ICD-10-CM | POA: Diagnosis not present

## 2019-02-23 NOTE — Telephone Encounter (Signed)
Called [name] to discuss the results of her metastatic survey as well as the blood work from her last visit.  Review of the metastatic survey shows no clear evidence of lytic lesions.  Also her MGUS labs appear stable at this time.  Given these findings I would recommend follow-up in approximately 1 years time.  [name] voiced her understanding of this plan.  Vickie Peoples, MD Department of Hematology/Oncology [place] at [facility] Phone: [phone] Pager: [phone] Email: [email or web address]

## 2019-02-27 LAB — UPEP/UIFE/LIGHT CHAINS/TP, 24-HR UR
% BETA, Urine: 0 %
ALPHA 1 URINE: 0 %
Albumin, U: 100 %
Alpha 2, Urine: 0 %
Free Kappa Lt Chains,Ur: 12.07 mg/L (ref 0.63–113.79)
Free Kappa/Lambda Ratio: >18.86 (ref 1.03–31.76)
Free Lambda Lt Chains,Ur: <0.64 mg/L (ref 0.47–11.77)
GAMMA GLOBULIN URINE: 0 %
Total Protein, Urine-Ur/day: 127 mg/24 hr (ref 30–150)
Total Protein, Urine: 8.4 mg/dL
Total Volume: 1510

## 2019-03-02 ENCOUNTER — Telehealth: Payer: Self-pay

## 2019-03-02 ENCOUNTER — Other Ambulatory Visit: Payer: Self-pay

## 2019-03-02 ENCOUNTER — Ambulatory Visit (INDEPENDENT_AMBULATORY_CARE_PROVIDER_SITE_OTHER): Payer: Medicare Other | Admitting: Pharmacist Clinician (PhC)/ Clinical Pharmacy Specialist

## 2019-03-02 DIAGNOSIS — I2584 Coronary atherosclerosis due to calcified coronary lesion: Secondary | ICD-10-CM

## 2019-03-02 DIAGNOSIS — E7849 Other hyperlipidemia: Secondary | ICD-10-CM | POA: Diagnosis not present

## 2019-03-02 DIAGNOSIS — I2510 Atherosclerotic heart disease of native coronary artery without angina pectoris: Secondary | ICD-10-CM

## 2019-03-02 MED ORDER — REPATHA PUSHTRONEX SYSTEM 420 MG/3.5ML ~~LOC~~ SOCT: 1.0000 | SUBCUTANEOUS | 6 refills | Status: DC

## 2019-03-02 NOTE — Progress Notes (Signed)
03/02/2019 Vivianne Drescher Jun 10, 1955 [id number]   HPI:  Shahed Greenhut is a 64 y.o. female patient of Dr Oval Linsey, who presents today for a lipid clinic evaluation.  In addition to hyperlipidemia her medical history is significant for coronary artery calcification, osteoporosis and rheumatoid arthritis.  She has never had a coronary event or stroke - her elevated cholesterol is high enough to be considered familial.    Current Medications: none  Cholesterol Goals: LDL < 100   Intolerant/previously tried: pravastatin - myalgias in legs within a month of starting  Family history: mother died at 30, believes the cholesterol issues from her side of the family; 2 siblings with high cholesterol, one on PCSK-9, doing well  Diet: mix of both home cooked and eating out, more restaurant quality, no fast food  Exercise:  No regular exercise since COVID - [employer] [facility]  Labs: 01/2019:  TC 347, TG 162, HDL 65, LDL 251   Current Outpatient Medications  Medication Sig Dispense Refill  . aspirin EC 81 MG tablet Take 81 mg by mouth daily.    . cholecalciferol (VITAMIN D) 1000 units tablet Take 3,000 Units by mouth daily.    . Coenzyme Q10 100 MG TABS Take 200 mg by mouth daily.    . fluticasone (FLONASE) 50 MCG/ACT nasal spray Place 1 spray into both nostrils as needed for allergies or rhinitis.    . Magnesium Citrate 100 MG TABS Take by mouth daily.    . metoprolol succinate (TOPROL-XL) 50 MG 24 hr tablet Take 1 tablet (50 mg total) by mouth daily. Take with or immediately following a meal. 90 tablet 3  . vitamin B-12 (CYANOCOBALAMIN) 1000 MCG tablet Take 1 tablet by mouth daily.    Marland Kitchen zinc sulfate 220 (50 Zn) MG capsule Take by mouth 3 (three) times daily.    . vitamin C (ASCORBIC ACID) 250 MG tablet Take 500 mg by mouth daily.      No current facility-administered medications for this visit.    Allergies  Allergen Reactions  . Penicillins Hives    Has patient had a PCN reaction  causing immediate rash, facial/tongue/throat swelling, SOB or lightheadedness with hypotension: No Has patient had a PCN reaction causing severe rash involving mucus membranes or skin necrosis: Yes Has patient had a PCN reaction that required hospitalization: No Has patient had a PCN reaction occurring within the last 10 years: No If all of the above answers are "NO", then may proceed with Cephalosporin use.   . Piroxicam Hives  . Sulfa Antibiotics Hives    Past Medical History:  Diagnosis Date  . Colon polyps   . Coronary artery calcification of native artery 02/21/2019   Minimal calcification noted 11/2018 on coronary CTA  . Costochondritis   . DDD (degenerative disc disease), cervical   . Degenerative arthritis of knee    Bilateral  . Degenerative disc disease, cervical   . Degenerative disc disease, cervical   . Fibromyalgia   . GERD (gastroesophageal reflux disease)   . Hyperlipidemia   . Inappropriate sinus tachycardia 02/21/2019  . Monoclonal gammopathy   . Multiple thyroid nodules   . Rheumatoid arthritis ([facility])     Blood pressure 126/82, pulse (!) 58, resp. rate 16, height 5\' 6"  (1.676 m), weight 146 lb 8 oz (66.5 kg), SpO2 99 %.   Familial hyperlipidemia Patient with familial hyperlipidemia, currently not on any medications.  Reviewed with her concerns of elevated readings as well as treatment options.  She will need PCSK-9 therapy  to reach goal of < 100.  Discussed side effects, dosing and mechanism of action.  Answered all questions.  We will start her on Repatha 420 mg monthly and repeat labs after 3rd dose.  Explained to patient that if we do not get to goal with just Repatha, should consider low dose rosuvastatin as an add on.   Tommy Medal PharmD CPP [place] [address] [place], [place] [address] [phone]

## 2019-03-02 NOTE — Patient Instructions (Addendum)
Your Results:             Your most recent labs Goal  Total Cholesterol 347 < 200  Triglycerides 162 < 150  HDL (happy/good cholesterol) 65 > 40  LDL (lousy/bad cholesterol 251 < 100   Medication changes:  We will start the paperwork to get Repatha covered by your insurance.  Once approved we will send it to your local pharmacy.  Use 1 device every 30 days.   Lab orders:  Go to the lab after 3rd dose.    If you have any problems or concerns, please give [place] a call.  Linsey Arteaga/Raquel at [phone]  Patient Assistance:  The [employer] Well foundation offers assistance to help pay for medication copays.  They will cover copays for all cholesterol lowering meds, including statins, fibrates, omega-3 oils, ezetimibe, Repatha, Praluent, Nexletol, Nexlizet.  The cards are usually good for $2,500 or 12 months, whichever comes first. 1. Go to healthwellfoundation.org 2. Click on "Apply Now" 3. Answer questions as to whom is applying (patient or representative) 4. Your disease fund will be "hypercholesterolemia - Medicare access" 5. They will ask questions about finances and which medications you are taking for cholesterol 6. When you submit, the approval is usually within minutes.  You will need to print the card information from the site 7. You will need to show this information to your pharmacy, they will bill your Medicare Part D plan first -then bill Health Well --for the copay.   You can also call them at [phone], although the hold times can be quite long.   Thank you for choosing [facility]

## 2019-03-02 NOTE — Telephone Encounter (Signed)
Called and spoke w/pt regarding the approval of the repatha pushtronix, rx sent, applied/approved for healthwell, email of benefits sent to patient to take to pharmacy, pt voiced understanding.

## 2019-03-02 NOTE — Assessment & Plan Note (Addendum)
Patient with familial hyperlipidemia, currently not on any medications.  Reviewed with her concerns of elevated readings as well as treatment options.  She will need PCSK-9 therapy to reach goal of < 100.  Discussed side effects, dosing and mechanism of action.  Answered all questions.  We will start her on Repatha 420 mg monthly and repeat labs after 3rd dose.  Explained to patient that if we do not get to goal with just Repatha, should consider low dose rosuvastatin as an add on.

## 2019-03-19 ENCOUNTER — Ambulatory Visit: Payer: Medicare Other

## 2019-03-22 ENCOUNTER — Telehealth: Payer: Self-pay

## 2019-03-22 NOTE — Telephone Encounter (Signed)
Called and spoke w/pt regrding the repatha pushtronix issue they are having with their insurance. I will contact [employer] directly to see what is going on

## 2019-04-06 ENCOUNTER — Ambulatory Visit: Payer: Medicare Other

## 2019-04-20 ENCOUNTER — Telehealth (INDEPENDENT_AMBULATORY_CARE_PROVIDER_SITE_OTHER): Payer: Medicare Other | Admitting: Cardiovascular Disease

## 2019-04-20 ENCOUNTER — Encounter: Payer: Self-pay | Admitting: Cardiovascular Disease

## 2019-04-20 VITALS — Ht 66.0 in

## 2019-04-20 DIAGNOSIS — R000 Tachycardia, unspecified: Secondary | ICD-10-CM | POA: Diagnosis not present

## 2019-04-20 DIAGNOSIS — I2584 Coronary atherosclerosis due to calcified coronary lesion: Secondary | ICD-10-CM | POA: Diagnosis not present

## 2019-04-20 DIAGNOSIS — I2510 Atherosclerotic heart disease of native coronary artery without angina pectoris: Secondary | ICD-10-CM | POA: Diagnosis not present

## 2019-04-20 DIAGNOSIS — E7849 Other hyperlipidemia: Secondary | ICD-10-CM

## 2019-04-20 MED ORDER — METOPROLOL SUCCINATE ER 50 MG PO TB24: ORAL_TABLET | ORAL | 3 refills | Status: DC

## 2019-04-20 NOTE — Patient Instructions (Addendum)
Medication Instructions:  INCREASE YOUR METOPROLOL TO 75 mg DAILY  *If you need a refill on your cardiac medications before your next appointment, please call your pharmacy*  Lab Work: NONE  Testing/Procedures: NONE  Follow-Up: At [facility], you and your health needs are our priority.  As part of our continuing mission to provide you with exceptional heart care, we have created designated Provider Care Teams.  These Care Teams include your primary Cardiologist (physician) and Advanced Practice Providers (APPs -  Physician Assistants and Nurse Practitioners) who all work together to provide you with the care you need, when you need it.  We recommend signing up for the patient portal called "MyChart".  Sign up information is provided on this After Visit Summary.  MyChart is used to connect with patients for Virtual Visits (Telemedicine).  Patients are able to view lab/test results, encounter notes, upcoming appointments, etc.  Non-urgent messages can be sent to your provider as well.   To learn more about what you can do with MyChart, go to [email or web address].    Your next appointment:   3 month(s) IN PERSON 07/17/2019 AT 11:00 AM

## 2019-04-20 NOTE — Progress Notes (Signed)
Cardiology Office Note   Date:  04/20/2019   ID:  Vickie Pearson, DOB 22-Jun-1955, MRN [id number]  PCP:  Ladell Pier, MD  Cardiologist:   Skeet Latch, MD   No chief complaint on file.     History of Present Illness: Vickie Pearson is a 64 y.o. female with inappropriate sinus tachycardia, Rheumatoid arthritis, MGUS, GERD, fibromyalgia, and familial hyperlipidemia here for follow up.  History of Present Illness:   Vickie Pearson was initially seen virtually 12/2018 with reports intermittent episodes of heart palpitations.  It was ongoing for the preceding 4 months but seemed to be happening more recently.  It feels like a fluttering sensation and occurs both at rest and with exertion.  Episode lasts between 15 and 20 seconds.  She sometimes feels lightheaded or dizzy.  Vickie Pearson was seen in the ED 11/24/2018 with chest pain and palpitations.  EKG did show 1 PVC but otherwise no significant arrhythmias.  Cardiac enzymes were negative.  She wore a 7-day event monitor that revealed no arrhythmias.  However she was noted to be tachycardic to the 140s at rest.  She reported atypical chest pain and had a coronary CT-a 02/2019 that revealed minimal obstruction.  Her calcium score was 50.3, which is 80th percentile for age and gender.  She hasn't been experiencing chest pain recently.  She continues to have heart fluttering that is worse at night.  The heart fluttering does not occur with exertion.  She has no exertional chest pain or shortness of breath.  Vickie Pearson LDL was 251.  Her sister also has very high lipids. She reports that her diet is good. She tried taking pravastatin in the past but developed myalgias.  She was referred to the lipid clinic and started on Repatha.  She is having an adult and is tolerating it well.  Overall she is feeling well.  At her last appointment she was started on metoprolol for palpitations.  This seems to have helped  but she still has palpitations approximately twice per week.  She notices it most when lying in bed in the mornings.  On average her heart rate has been in the 80s.  She uses a pulse oximeter to check it.  She does not check blood pressure.  She has not been getting much exercise due to the coronavirus.  She plans to reactivate her [employer] membership now that she has had her vaccines.  She has no lower extremity edema, orthopnea, or PND.  She denies exertional chest pain or dyspnea.  She has had mild lightheadedness but not as severe as before she started metoprolol.  Past Medical History:  Diagnosis Date  . Colon polyps   . Coronary artery calcification of native artery 02/21/2019   Minimal calcification noted 11/2018 on coronary CTA  . Costochondritis   . DDD (degenerative disc disease), cervical   . Degenerative arthritis of knee    Bilateral  . Degenerative disc disease, cervical   . Degenerative disc disease, cervical   . Fibromyalgia   . GERD (gastroesophageal reflux disease)   . Hyperlipidemia   . Inappropriate sinus tachycardia 02/21/2019  . Monoclonal gammopathy   . Multiple thyroid nodules   . Rheumatoid arthritis ([place])     No past surgical history on file.   Current Outpatient Medications  Medication Sig Dispense Refill  . aspirin EC 81 MG tablet Take 81 mg by mouth daily.    . cholecalciferol (VITAMIN D) 1000 units tablet Take 3,000 Units by  mouth daily.    . Evolocumab with Infusor ([place]) 420 MG/3.5ML SOCT Inject 1 applicator into the skin every 30 (thirty) days. 3.6 mL 6  . fluticasone (FLONASE) 50 MCG/ACT nasal spray Place 1 spray into both nostrils as needed for allergies or rhinitis.    . Magnesium Citrate 100 MG TABS Take by mouth daily.    . metoprolol succinate (TOPROL-XL) 50 MG 24 hr tablet TAKE 1 AND 1/2 TABLETS DAILY Take with or immediately following a meal. 90 tablet 3  . vitamin B-12 (CYANOCOBALAMIN) 1000 MCG tablet Take 1 tablet by mouth  daily.    Marland Kitchen zinc sulfate 220 (50 Zn) MG capsule Take by mouth 3 (three) times daily.    . Coenzyme Q10 100 MG TABS Take 200 mg by mouth daily.     No current facility-administered medications for this visit.    Allergies:   Penicillins, Piroxicam, and Sulfa antibiotics    Social History:  The patient  reports that she quit smoking about 30 years ago. Her smoking use included cigarettes. She smoked 1.00 pack per day. She has never used smokeless tobacco. She reports current alcohol use. She reports that she does not use drugs.   Family History:  The patient's family history includes Arthritis in her father and mother; Colon polyps in her mother; Heart disease in her father; Hyperlipidemia in her brother and sister; Hypertension in her brother, father, mother, and sister; Kidney disease in her father; Stroke in her mother.    ROS:  Please see the history of present illness.   Otherwise, review of systems are positive for none.   All other systems are reviewed and negative.    PHYSICAL EXAM: VS:  Ht 5\' 6"  (1.676 m)   BMI 23.65 kg/m  , BMI Body mass index is 23.65 kg/m. GENERAL: Sounds well LUNGS: Respirations unlabored NEURO: Speech fluent PSYCH:  Cognitively intact, oriented to person place and time   EKG:  EKG is not ordered today. The ekg ordered 11/24/18 demonstrates sinus rhythm.  Rate 81 bpm.  PVC.    Recent Labs: 11/24/2018: TSH 0.935 02/15/2019: ALT 10; BUN 10; Creatinine 0.84; Hemoglobin 13.3; Platelet Count 278; Potassium 3.8; Sodium 140    Lipid Panel    Component Value Date/Time   CHOL 347 (H) 02/06/2019 1128   TRIG 162 (H) 02/06/2019 1128   HDL 65 02/06/2019 1128   CHOLHDL 5.3 (H) 02/06/2019 1128   LDLCALC 251 (H) 02/06/2019 1128      Wt Readings from Last 3 Encounters:  03/02/19 146 lb 8 oz (66.5 kg)  02/21/19 141 lb 9.6 oz (64.2 kg)  02/15/19 143 lb 3.2 oz (65 kg)      ASSESSMENT AND PLAN:  # Palpitations: # Inappropriate sinus tachycardia: Ms.  Pearson wore a monitor that did not show any arrhythmias.  However she did have sinus tachycardia up to the 140s at rest.  I symptoms have improved somewhat on metoprolol but are still persistent.  We will increase to 75 mg daily.  # Non-obstructive CAD: # Familial hyperlipidemia: Ms. Mcgann had minimal coronary artery disease on cardiac CT-a, but her calcium score was 80th percentile.  Her LDL was 251 with a total cholesterol 347.  Given that she had myalgias on very low-dose pravastatin, it is very unlikely she is going to tolerate atorvastatin or rosuvastatin.  She is unwilling to try.    So far she is tolerating Repatha well.  Labs will be reassessed after the third dose.  Current medicines are reviewed at length with the patient today.  The patient does not have concerns regarding medicines.  The following changes have been made: Increase metoprolol  Labs/ tests ordered today include:   No orders of the defined types were placed in this encounter.    Disposition:   FU with Aadarsh Cozort C. Oval Linsey, MD, [facility] in 3 months.      Signed, [place] Gilliam C. Oval Linsey, MD, [facility]  04/20/2019 12:12 PM    [place]

## 2019-04-21 ENCOUNTER — Encounter: Payer: Self-pay | Admitting: Internal Medicine

## 2019-04-21 ENCOUNTER — Other Ambulatory Visit: Payer: Self-pay

## 2019-04-21 ENCOUNTER — Ambulatory Visit: Payer: Medicare Other | Attending: Internal Medicine | Admitting: Internal Medicine

## 2019-04-21 VITALS — BP 102/61 | HR 62 | Resp 16 | Wt 149.8 lb

## 2019-04-21 DIAGNOSIS — Z7982 Long term (current) use of aspirin: Secondary | ICD-10-CM | POA: Insufficient documentation

## 2019-04-21 DIAGNOSIS — M069 Rheumatoid arthritis, unspecified: Secondary | ICD-10-CM | POA: Insufficient documentation

## 2019-04-21 DIAGNOSIS — Z1211 Encounter for screening for malignant neoplasm of colon: Secondary | ICD-10-CM | POA: Diagnosis not present

## 2019-04-21 DIAGNOSIS — M0609 Rheumatoid arthritis without rheumatoid factor, multiple sites: Secondary | ICD-10-CM | POA: Diagnosis not present

## 2019-04-21 DIAGNOSIS — Z1231 Encounter for screening mammogram for malignant neoplasm of breast: Secondary | ICD-10-CM | POA: Diagnosis not present

## 2019-04-21 DIAGNOSIS — M810 Age-related osteoporosis without current pathological fracture: Secondary | ICD-10-CM | POA: Diagnosis not present

## 2019-04-21 DIAGNOSIS — E559 Vitamin D deficiency, unspecified: Secondary | ICD-10-CM | POA: Insufficient documentation

## 2019-04-21 DIAGNOSIS — I2510 Atherosclerotic heart disease of native coronary artery without angina pectoris: Secondary | ICD-10-CM

## 2019-04-21 DIAGNOSIS — M797 Fibromyalgia: Secondary | ICD-10-CM | POA: Diagnosis not present

## 2019-04-21 DIAGNOSIS — E7849 Other hyperlipidemia: Secondary | ICD-10-CM | POA: Diagnosis not present

## 2019-04-21 DIAGNOSIS — Z8261 Family history of arthritis: Secondary | ICD-10-CM | POA: Insufficient documentation

## 2019-04-21 DIAGNOSIS — Z8742 Personal history of other diseases of the female genital tract: Secondary | ICD-10-CM | POA: Insufficient documentation

## 2019-04-21 DIAGNOSIS — Z7901 Long term (current) use of anticoagulants: Secondary | ICD-10-CM | POA: Diagnosis not present

## 2019-04-21 DIAGNOSIS — Z87891 Personal history of nicotine dependence: Secondary | ICD-10-CM | POA: Diagnosis not present

## 2019-04-21 DIAGNOSIS — D472 Monoclonal gammopathy: Secondary | ICD-10-CM | POA: Diagnosis not present

## 2019-04-21 DIAGNOSIS — M62838 Other muscle spasm: Secondary | ICD-10-CM | POA: Diagnosis not present

## 2019-04-21 DIAGNOSIS — I2584 Coronary atherosclerosis due to calcified coronary lesion: Secondary | ICD-10-CM

## 2019-04-21 DIAGNOSIS — D849 Immunodeficiency, unspecified: Secondary | ICD-10-CM | POA: Insufficient documentation

## 2019-04-21 DIAGNOSIS — Z79899 Other long term (current) drug therapy: Secondary | ICD-10-CM | POA: Insufficient documentation

## 2019-04-21 DIAGNOSIS — Z8719 Personal history of other diseases of the digestive system: Secondary | ICD-10-CM | POA: Diagnosis not present

## 2019-04-21 MED ORDER — CYCLOBENZAPRINE HCL 5 MG PO TABS: 5.0000 mg | ORAL_TABLET | Freq: Two times a day (BID) | ORAL | 1 refills | Status: DC | PRN

## 2019-04-21 NOTE — Progress Notes (Signed)
Patient ID: Vickie Pearson, female    DOB: Jul 17, 1955  MRN: [id number]  CC: re-establish   Subjective: Vickie Pearson is a 64 y.o. female who presents to reestablish care and for chronic ds management.  I last saw her in 2018 when she had relocated here from [place]. Her concerns today include:  Hx of RA, monoclonal gammopathy, familial HL and non-obstrutive CAD, Vit D def, osteoporosis,cervical radiculopathy, fibromyalgia  Last seen 2018.  Insurance changed.  RA:  Followed by [facility] for short period.  She decided to change back to Dr. Estanislado Pandy changing back to [place].  Has appt in 07/2019 Off MTX x 1 yr because she had ran out before she got back in with the rheumatologist No flare. Usual pain in knees and back.  Prescribed Flexeril by her rheumatologist for muscle spasms as needed.  However since she is between providers, she is requesting a refill from me  MGUS:  Followed by Dr. Lorenso Courier and was last seen 02/2019.  M protein was only mildly elevated.  Recent skeletal survey showed no lytic lesions.  Plan is for follow-up in 1 year  HL/nonobst CAD: She has been seeing cardiologist Dr. Oval Linsey.  On Repatha for familial hyperlipidemia.  Requests referral to GI for c-scope.  Last one 07/2013 in [place].  + polyps.   Need referral to GYN.  Last Pap March/2018 was normal per patient she will need repeat in 5 years. Does have a history of ASCUS favoring dysplasia in 2010 with colpo biopsy showing benign findings at that time.  Hx of osteoporosis:  Had received Reclast infusion in 2018 before leaving [place].she is wondering whether she needs to continue treatment.  Overdue for bone density.  On vit D 3000. Not on calcium supplement  Past medical, social, family history, reviewed and updated Patient Active Problem List   Diagnosis Date Noted  . Coronary artery calcification of native artery 02/21/2019  . Inappropriate sinus tachycardia 02/21/2019  .  Other chest pain 03/01/2017  . Rheumatoid arthritis involving multiple sites ([place]) 07/24/2016  . Cervical radiculopathy 07/24/2016  . Familial hyperlipidemia 07/24/2016  . Osteoporosis 07/24/2016  . Vitamin D deficiency 07/24/2016  . Multiple thyroid nodules 07/24/2016  . MGUS (monoclonal gammopathy of unknown significance) 07/24/2016  . Colon polyps 07/24/2016  . 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 Hx of abnormal cervical Pap smear 07/24/2016  . OA (osteoarthritis) of knee 07/24/2016     Current Outpatient Medications on File Prior to Visit  Medication Sig Dispense Refill  . aspirin EC 81 MG tablet Take 81 mg by mouth daily.    . cholecalciferol (VITAMIN D) 1000 units tablet Take 3,000 Units by mouth daily.    . Coenzyme Q10 100 MG TABS Take 200 mg by mouth daily.    . Evolocumab with Infusor ([place]) 420 MG/3.5ML SOCT Inject 1 applicator into the skin every 30 (thirty) days. 3.6 mL 6  . fluticasone (FLONASE) 50 MCG/ACT nasal spray Place 1 spray into both nostrils as needed for allergies or rhinitis.    . Magnesium Citrate 100 MG TABS Take by mouth daily.    . metoprolol succinate (TOPROL-XL) 50 MG 24 hr tablet TAKE 1 AND 1/2 TABLETS DAILY Take with or immediately following a meal. 90 tablet 3  . vitamin B-12 (CYANOCOBALAMIN) 1000 MCG tablet Take 1 tablet by mouth daily.    Marland Kitchen zinc sulfate 220 (50 Zn) MG capsule Take by mouth 3 (three) times daily.  No current facility-administered medications on file prior to visit.    Allergies  Allergen Reactions  . Penicillins Hives    Has patient had a PCN reaction causing immediate rash, facial/tongue/throat swelling, SOB or lightheadedness with hypotension: No Has patient had a PCN reaction causing severe rash involving mucus membranes or skin necrosis: Yes Has patient had a PCN reaction that required hospitalization: No Has patient had a PCN reaction occurring within the last 10 years: No If all of the above  answers are "NO", then may proceed with Cephalosporin use.   . Piroxicam Hives  . Sulfa Antibiotics Hives    Social History   Socioeconomic History  . Marital status: Widowed    Spouse name: Not on file  . Number of children: Not on file  . Years of education: Not on file  . Highest education level: Not on file  Occupational History  . Not on file  Tobacco Use  . Smoking status: Former Smoker    Packs/day: 1.00    Types: Cigarettes    Quit date: 07/24/1988    Years since quitting: 30.7  . Smokeless tobacco: Never Used  Substance and Sexual Activity  . Alcohol use: Yes    Comment: occasionally  . Drug use: No  . Sexual activity: Not on file  Other Topics Concern  . Not on file  Social History Narrative  . Not on file   Social Determinants of Health   Financial Resource Strain:   . Difficulty of Paying Living Expenses:   Food Insecurity:   . Worried About Charity fundraiser in the Last Year:   . Arboriculturist in the Last Year:   Transportation Needs:   . Film/video editor (Medical):   Marland Kitchen Lack of Transportation (Non-Medical):   Physical Activity:   . Days of Exercise per Week:   . Minutes of Exercise per Session:   Stress:   . Feeling of Stress :   Social Connections:   . Frequency of Communication with Friends and Family:   . Frequency of Social Gatherings with Friends and Family:   . Attends Religious Services:   . Active Member of Clubs or Organizations:   . Attends Archivist Meetings:   Marland Kitchen Marital Status:   Intimate Partner Violence:   . Fear of Current or Ex-Partner:   . Emotionally Abused:   Marland Kitchen Physically Abused:   . Sexually Abused:     Family History  Problem Relation Age of Onset  . Hypertension Mother   . Colon polyps Mother   . Stroke Mother   . Arthritis Mother   . Hypertension Father   . Heart disease Father   . Arthritis Father   . Kidney disease Father   . Hypertension Sister   . Hyperlipidemia Sister   .  Hypertension Brother   . Hyperlipidemia Brother     No past surgical history on file.  ROS: Review of Systems Negative except as stated above  PHYSICAL EXAM: BP 102/61   Pulse 62   Resp 16   Wt 149 lb 12.8 oz (67.9 kg)   SpO2 100%   BMI 24.18 kg/m   Physical Exam  General appearance - alert, well appearing, older African-American female and in no distress Mental status - normal mood, behavior, speech, dress, motor activity, and thought processes Neck - supple, no significant adenopathy Chest - clear to auscultation, no wheezes, rales or rhonchi, symmetric air entry Heart - normal rate, regular rhythm, normal  S1, S2, no murmurs, rubs, clicks or gallops Musculoskeletal -mild enlargement of some of the PIP joints.  She has valgum deformity of the knees Extremities -no lower extremity edema  CMP Latest Ref Rng & Units 02/15/2019 02/06/2019 11/24/2018  Glucose 70 - 99 mg/dL 93 87 105(H)  BUN 8 - 23 mg/dL 10 10 9   Creatinine 0.44 - 1.00 mg/dL 0.84 0.88 0.85  Sodium 135 - 145 mmol/L 140 142 139  Potassium 3.5 - 5.1 mmol/L 3.8 4.3 3.4(L)  Chloride 98 - 111 mmol/L 106 104 105  CO2 22 - 32 mmol/L 28 21 24   Calcium 8.9 - 10.3 mg/dL 9.6 9.8 9.7  Total Protein 6.5 - 8.1 g/dL 7.8 7.1 -  Total Bilirubin 0.3 - 1.2 mg/dL 0.3 0.2 -  Alkaline Phos 38 - 126 U/L 81 85 -  AST 15 - 41 U/L 15 20 -  ALT 0 - 44 U/L 10 13 -   Lipid Panel     Component Value Date/Time   CHOL 347 (H) 02/06/2019 1128   TRIG 162 (H) 02/06/2019 1128   HDL 65 02/06/2019 1128   CHOLHDL 5.3 (H) 02/06/2019 1128   LDLCALC 251 (H) 02/06/2019 1128    CBC    Component Value Date/Time   WBC 5.8 02/15/2019 1053   WBC 4.5 11/24/2018 0724   RBC 4.31 02/15/2019 1053   HGB 13.3 02/15/2019 1053   HGB 12.7 02/11/2017 1035   HCT 40.9 02/15/2019 1053   HCT 39.6 02/11/2017 1035   PLT 278 02/15/2019 1053   PLT 239 02/11/2017 1035   PLT 256 07/24/2016 1654   MCV 94.9 02/15/2019 1053   MCV 99.7 02/11/2017 1035   MCH 30.9  02/15/2019 1053   MCHC 32.5 02/15/2019 1053   RDW 14.4 02/15/2019 1053   RDW 14.9 (H) 02/11/2017 1035   LYMPHSABS 2.7 02/15/2019 1053   LYMPHSABS 2.0 02/11/2017 1035   MONOABS 0.4 02/15/2019 1053   MONOABS 0.3 02/11/2017 1035   EOSABS 0.1 02/15/2019 1053   EOSABS 0.1 02/11/2017 1035   BASOSABS 0.0 02/15/2019 1053   BASOSABS 0.0 02/11/2017 1035    ASSESSMENT AND PLAN: 1. Rheumatoid arthritis of multiple sites with negative rheumatoid factor ([place]) She will keep her appointment with the rheumatologist later this year.  She has been off methotrexate for a year without significant joint pains.  2. Familial hyperlipidemia 3. Coronary artery calcification of native artery Followed by cardiology.  On Repatha.  4. MGUS (monoclonal gammopathy of unknown significance) Followed by oncology.  Stable and plan for follow-up in 1 year  5. Age-related osteoporosis without current pathological fracture We will get a bone density study and then refer her to endocrinology.  Recommend taking calcium supplement 600 mg twice a day - DG Bone Density; Future  6. Muscle spasm - cyclobenzaprine (FLEXERIL) 5 MG tablet; Take 1 tablet (5 mg total) by mouth 2 (two) times daily as needed for muscle spasms.  Dispense: 30 tablet; Refill: 1  7. Screening for colon cancer Prior history of colon polyps.  Due for repeat Pap - Ambulatory referral to Gastroenterology  8. History of abnormal cervical Pap smear I told patient that based on her age and the fact that her last Pap smear was normal she does not need further screening but we can send her to gynecology for an opinion on this - Ambulatory referral to Gynecology  9. Encounter for screening mammogram for malignant neoplasm of breast - MM Digital Screening; Future     Patient was  given the opportunity to ask questions.  Patient verbalized understanding of the plan and was able to repeat key elements of the plan.   No orders of the defined types were  placed in this encounter.    Requested Prescriptions    No prescriptions requested or ordered in this encounter    No follow-ups on file.  Karle Plumber, MD, FACP

## 2019-04-21 NOTE — Patient Instructions (Signed)
You should purchase calcium supplement over-the-counter and take 600 mg twice a day.

## 2019-05-08 NOTE — Progress Notes (Signed)
Office Visit Note  Patient: Vickie Pearson             Date of Birth: Dec 01, 1955           MRN: [id number]             PCP: Ladell Pier, MD Referring: Ladell Pier, MD Visit Date: 05/09/2019 Occupation: @GUAROCC @  Subjective:  Pain in joints.   History of Present Illness: Alija Cantrelle is a 64 y.o. female with history of seronegative rheumatoid arthritis, osteoarthritis and degenerative disc disease.  She returns today after her last visit on September 29, 2017.  Due to the insurance issues she was under care of Dr. Larwance Rote in [facility].  Patient states that she ran out of methotrexate after her last visit with me.  She could not see her new rheumatologist until July 2020.  At that time she was still doing well without methotrexate and the decision was made not to start her on methotrexate.  She has been on no medications for rheumatoid arthritis.  She denies any flare of iritis.  She has been seeing an ophthalmologist on a regular basis.  She continues to have discomfort in her cervical spine, lumbar spine and her knee joints.  She continues to have some discomfort in her left trochanteric region.  Activities of Daily Living:  Patient reports morning stiffness for 30 minutes.   Patient Reports nocturnal pain.  Difficulty dressing/grooming: Denies Difficulty climbing stairs: Denies Difficulty getting out of chair: Denies Difficulty using hands for taps, buttons, cutlery, and/or writing: Denies  Review of Systems  Constitutional: Positive for fatigue. Negative for night sweats, weight gain and weight loss.  HENT: Negative for mouth sores, trouble swallowing, trouble swallowing, mouth dryness and nose dryness.   Eyes: Positive for dryness. Negative for pain, redness, itching and visual disturbance.  Respiratory: Negative for cough, shortness of breath, wheezing and difficulty breathing.   Cardiovascular: Positive for palpitations. Negative for chest pain,  hypertension, irregular heartbeat and swelling in legs/feet.       Cardiology w/u negative per pt.  Gastrointestinal: Negative for blood in stool, constipation and diarrhea.  Endocrine: Negative for increased urination.  Genitourinary: Negative for difficulty urinating, painful urination and vaginal dryness.  Musculoskeletal: Positive for arthralgias, joint pain, myalgias, morning stiffness, muscle tenderness and myalgias. Negative for joint swelling and muscle weakness.  Skin: Negative for color change, rash, hair loss, redness, skin tightness, ulcers and sensitivity to sunlight.  Allergic/Immunologic: Negative for susceptible to infections.  Neurological: Positive for headaches. Negative for dizziness, numbness, memory loss, night sweats and weakness.  Hematological: Negative for bruising/bleeding tendency and swollen glands.  Psychiatric/Behavioral: Positive for sleep disturbance. Negative for depressed mood and confusion. The patient is not nervous/anxious.     PMFS History:  Patient Active Problem List   Diagnosis Date Noted  . Immunosuppression ([place]) 04/21/2019  . Coronary artery calcification of native artery 02/21/2019  . Inappropriate sinus tachycardia 02/21/2019  . Other chest pain 03/01/2017  . Rheumatoid arthritis involving multiple sites ([place]) 07/24/2016  . Cervical radiculopathy 07/24/2016  . Familial hyperlipidemia 07/24/2016  . Osteoporosis 07/24/2016  . Vitamin D deficiency 07/24/2016  . Multiple thyroid nodules 07/24/2016  . MGUS (monoclonal gammopathy of unknown significance) 07/24/2016  . Colon polyps 07/24/2016  . 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 Hx of abnormal cervical Pap smear 07/24/2016  . OA (osteoarthritis) of knee 07/24/2016    Past Medical History:  Diagnosis Date  . Colon polyps   .  Coronary artery calcification of native artery 02/21/2019   Minimal calcification noted 11/2018 on coronary CTA  . Costochondritis   . DDD (degenerative disc  disease), cervical   . Degenerative arthritis of knee    Bilateral  . Degenerative disc disease, cervical   . Degenerative disc disease, cervical   . Fibromyalgia   . GERD (gastroesophageal reflux disease)   . Hyperlipidemia   . Inappropriate sinus tachycardia 02/21/2019  . Monoclonal gammopathy   . Multiple thyroid nodules   . Rheumatoid arthritis ([place])     Family History  Problem Relation Age of Onset  . Hypertension Mother   . Colon polyps Mother   . Stroke Mother   . Arthritis Mother   . Hypertension Father   . Heart disease Father   . Arthritis Father   . Kidney disease Father   . Hypertension Sister   . Hyperlipidemia Sister   . Hypertension Brother   . Hyperlipidemia Brother    History reviewed. No pertinent surgical history. Social History   Social History Narrative  . Not on file   Immunization History  Administered Date(s) Administered  . Influenza, High Dose Seasonal PF 11/10/2018  . Influenza, Quadrivalent, Recombinant, Inj, Pf 11/15/2017  . Influenza,inj,Quad PF,6+ Mos 11/09/2016     Objective: Vital Signs: BP 125/73 (BP Location: Left Arm, Patient Position: Sitting, Cuff Size: Normal)   Pulse 66   Resp 13   Ht 5' 6.5" (1.689 m)   Wt 152 lb (68.9 kg)   BMI 24.17 kg/m    Physical Exam Vitals and nursing note reviewed.  Constitutional:      Appearance: She is well-developed.  HENT:     Head: Normocephalic and atraumatic.  Eyes:     Conjunctiva/sclera: Conjunctivae normal.  Cardiovascular:     Rate and Rhythm: Normal rate and regular rhythm.     Heart sounds: Normal heart sounds.  Pulmonary:     Effort: Pulmonary effort is normal.     Breath sounds: Normal breath sounds.  Abdominal:     General: Bowel sounds are normal.     Palpations: Abdomen is soft.  Musculoskeletal:     Cervical back: Normal range of motion.  Lymphadenopathy:     Cervical: No cervical adenopathy.  Skin:    General: Skin is warm and dry.     Capillary Refill:  Capillary refill takes less than 2 seconds.  Neurological:     Mental Status: She is alert and oriented to person, place, and time.  Psychiatric:        Behavior: Behavior normal.      Musculoskeletal Exam: Patient had good range of motion of cervical and lumbar spine with some discomfort.  Shoulder joints, elbow joints were in good range of motion.  She has limited range of motion of bilateral wrist joints with some synovitis over bilateral wrist joints.  She is some thickening of MCP joints without synovitis.  Some PIP thickening was noted.  She has good range of motion of bilateral hip joints.  She has contracture in her bilateral knee joints with limited extension.  There was not much warmth and her knee joints.  Ankle joints with good range of motion.  She has no tenderness over MTP joints.  CDAI Exam: CDAI Score: 6.8  Patient Global: 4 mm; Provider Global: 4 mm Swollen: 2 ; Tender: 6  Joint Exam 05/09/2019      Right  Left  Wrist  Swollen Tender  Swollen Tender  Cervical Spine  Tender     Lumbar Spine   Tender     Knee   Tender   Tender     Investigation: No additional findings.  Imaging: No results found.  Recent Labs: Lab Results  Component Value Date   WBC 5.8 02/15/2019   HGB 13.3 02/15/2019   PLT 278 02/15/2019   NA 140 02/15/2019   K 3.8 02/15/2019   CL 106 02/15/2019   CO2 28 02/15/2019   GLUCOSE 93 02/15/2019   BUN 10 02/15/2019   CREATININE 0.84 02/15/2019   BILITOT 0.3 02/15/2019   ALKPHOS 81 02/15/2019   AST 15 02/15/2019   ALT 10 02/15/2019   PROT 7.8 02/15/2019   ALBUMIN 4.0 02/15/2019   CALCIUM 9.6 02/15/2019   GFRAA >60 02/15/2019    Speciality Comments: No specialty comments available.  Procedures:  Large Joint Inj: bilateral knee on 05/09/2019 1:15 PM Indications: pain Details: 27 G 1.5 in needle, medial approach  Arthrogram: No  Medications (Right): 1.5 mL lidocaine 1 %; 40 mg triamcinolone acetonide 40 MG/ML Medications (Left):  1.5 mL lidocaine 1 %; 40 mg triamcinolone acetonide 40 MG/ML Outcome: tolerated well, no immediate complications Procedure, treatment alternatives, risks and benefits explained, specific risks discussed. Consent was given by the patient. Immediately prior to procedure a time out was called to verify the correct patient, procedure, equipment, support staff and site/side marked as required. Patient was prepped and draped in the usual sterile fashion.     Allergies: Penicillins, Piroxicam, and Sulfa antibiotics   Assessment / Plan:     Visit Diagnoses: Rheumatoid arthritis of multiple sites with negative rheumatoid factor ([place]) - She was previously on PLQ and then was switched to methotrexate 0.8 mL subcu weekly.  She saw Dr. Earnest Conroy in 2020 due to insurance.  She had been off medications since her last visit in 2019.  She states she continues to have some joint pain and stiffness but it did not get any worse and she decided to stay off the medication.  She has been having increased pain and discomfort in her knee joints and difficulty walking.  She has end-stage rheumatoid arthritis in her knees.  She also had limited range of motion of bilateral wrist joints with synovitis in bilateral wrist joints on examination today.  A detailed discussion regarding going back on methotrexate.  Indications side effects contraindications were reviewed.  She is willing to go back on methotrexate subcu.  We will start her on 0.8 mL subcu weekly along with folic acid 2 mg p.o. daily.  We will check labs in a month and then every 3 months to monitor for drug toxicity.  I plan to repeat x-ray of her bilateral hands and her feet at the follow-up visit.  Drug Counseling TB Gold: October 02, 2016 Hepatitis panel: October 02, 2016  Chest-xray: October 02, 2016  Contraception: Not applicable  Alcohol use: Denies use  Patient was counseled on the purpose, proper use, and adverse effects of methotrexate including nausea, infection,  and signs and symptoms of pneumonitis.  Reviewed instructions with patient to take methotrexate weekly along with folic acid daily.  Discussed the importance of frequent monitoring of kidney and liver function and blood counts, and provided patient with standing lab instructions.  Counseled patient to avoid NSAIDs and alcohol while on methotrexate.  Provided patient with educational materials on methotrexate and answered all questions.  Advised patient to get annual influenza vaccine and to get a pneumococcal vaccine if patient has not already  had one.  Patient voiced understanding.  Patient consented to methotrexate use.  Will upload into chart.    High risk medication use -she will be starting MTX 0.8 ml subcutaneous injections once weekly and folic acid 2 mg daily.  Her labs will be in a month and then every 3 months.  Chronic iritis of both eyes-patient states that she has been seeing her ophthalmologist and has not had a flare of iritis.  Primary osteoarthritis and rheumatoid arthritis of both knees-she has severe end-stage rheumatoid arthritis with contractures in her bilateral knee joints.  She is not ready for total knee replacement.  She requests bilateral cortisone injections.  After informed consent was obtained and side effects were discussed bilateral knee joints were injected with cortisone as described above.  DDD (degenerative disc disease), cervical-she has chronic stiffness and discomfort.  DDD (degenerative disc disease), lumbar-she has been experiencing some lower back pain.  Trochanteric bursitis, left hip-she continues to have some trochanteric bursitis most likely related to the arthritis in her knees.  Age-related osteoporosis without current pathological fracture-DEXA is followed by her PCP.  She is not currently on any medications.  Vitamin D deficiency-she states taking vitamin D supplement.  Monoclonal gammopathy-followed by hematology.  Multiple thyroid  nodules  Mixed hyperlipidemia  Orders: Orders Placed This Encounter  Procedures  . Large Joint Inj: bilateral knee  . CBC with Differential/Platelet  . COMPLETE METABOLIC PANEL WITH GFR  . CBC with Differential/Platelet  . COMPLETE METABOLIC PANEL WITH GFR  . CBC with Differential/Platelet  . COMPLETE METABOLIC PANEL WITH GFR   Meds ordered this encounter  Medications  . folic acid (FOLVITE) 1 MG tablet    Sig: Take 2 tablets (2 mg total) by mouth daily.    Dispense:  180 tablet    Refill:  2  . methotrexate 50 MG/2ML injection    Sig: Inject 0.8 mL into the skin once weekly.    Dispense:  10 mL    Refill:  0  . Tuberculin-Allergy Syringes 27G X 1/2" 1 ML MISC    Sig: Use 1 syringe weekly to inject methotrexate.    Dispense:  12 each    Refill:  2    Face-to-face time spent with patient was 45 minutes. Greater than 50% of time was spent in counseling and coordination of care.  Follow-Up Instructions: Return in about 3 months (around 08/09/2019) for Rheumatoid arthritis.   Bo Merino, MD  Note - This record has been created using Editor, commissioning.  Chart creation errors have been sought, but may not always  have been located. Such creation errors do not reflect on  the standard of medical care.

## 2019-05-09 ENCOUNTER — Ambulatory Visit (INDEPENDENT_AMBULATORY_CARE_PROVIDER_SITE_OTHER): Payer: Medicare Other | Admitting: Rheumatology

## 2019-05-09 ENCOUNTER — Encounter: Payer: Self-pay | Admitting: Rheumatology

## 2019-05-09 ENCOUNTER — Other Ambulatory Visit: Payer: Self-pay

## 2019-05-09 VITALS — BP 125/73 | HR 66 | Resp 13 | Ht 66.5 in | Wt 152.0 lb

## 2019-05-09 DIAGNOSIS — M170 Bilateral primary osteoarthritis of knee: Secondary | ICD-10-CM

## 2019-05-09 DIAGNOSIS — I2584 Coronary atherosclerosis due to calcified coronary lesion: Secondary | ICD-10-CM

## 2019-05-09 DIAGNOSIS — M0609 Rheumatoid arthritis without rheumatoid factor, multiple sites: Secondary | ICD-10-CM | POA: Diagnosis not present

## 2019-05-09 DIAGNOSIS — Z79899 Other long term (current) drug therapy: Secondary | ICD-10-CM | POA: Diagnosis not present

## 2019-05-09 DIAGNOSIS — E559 Vitamin D deficiency, unspecified: Secondary | ICD-10-CM

## 2019-05-09 DIAGNOSIS — M810 Age-related osteoporosis without current pathological fracture: Secondary | ICD-10-CM

## 2019-05-09 DIAGNOSIS — I2510 Atherosclerotic heart disease of native coronary artery without angina pectoris: Secondary | ICD-10-CM

## 2019-05-09 DIAGNOSIS — H2013 Chronic iridocyclitis, bilateral: Secondary | ICD-10-CM | POA: Diagnosis not present

## 2019-05-09 DIAGNOSIS — M5136 Other intervertebral disc degeneration, lumbar region: Secondary | ICD-10-CM

## 2019-05-09 DIAGNOSIS — D472 Monoclonal gammopathy: Secondary | ICD-10-CM

## 2019-05-09 DIAGNOSIS — M7062 Trochanteric bursitis, left hip: Secondary | ICD-10-CM

## 2019-05-09 DIAGNOSIS — E782 Mixed hyperlipidemia: Secondary | ICD-10-CM

## 2019-05-09 DIAGNOSIS — M5030 Other cervical disc degeneration, unspecified cervical region: Secondary | ICD-10-CM

## 2019-05-09 DIAGNOSIS — E042 Nontoxic multinodular goiter: Secondary | ICD-10-CM

## 2019-05-09 MED ORDER — TRIAMCINOLONE ACETONIDE 40 MG/ML IJ SUSP
40.0000 mg | INTRAMUSCULAR | Status: AC | PRN
  Administered 2019-05-09: 40 mg via INTRA_ARTICULAR

## 2019-05-09 MED ORDER — "TUBERCULIN-ALLERGY SYRINGES 27G X 1/2"" 1 ML MISC": 2 refills | Status: DC

## 2019-05-09 MED ORDER — LIDOCAINE HCL 1 % IJ SOLN
1.5000 mL | INTRAMUSCULAR | Status: AC | PRN
  Administered 2019-05-09: 1.5 mL

## 2019-05-09 MED ORDER — METHOTREXATE SODIUM CHEMO INJECTION 50 MG/2ML: INTRAMUSCULAR | 0 refills | Status: DC

## 2019-05-09 MED ORDER — FOLIC ACID 1 MG PO TABS: 2.0000 mg | ORAL_TABLET | Freq: Every day | ORAL | 2 refills | Status: DC

## 2019-05-09 NOTE — Patient Instructions (Signed)
What is this medicine? METHOTREXATE (METH oh TREX ate) is a chemotherapy drug used to treat cancer including breast cancer, leukemia, and lymphoma. This medicine can also be used to treat psoriasis and certain kinds of arthritis. This medicine may be used for other purposes; ask your health care provider or pharmacist if you have questions. COMMON BRAND NAME(S): Rheumatrex, Trexall What should I tell my health care provider before I take this medicine? They need to know if you have any of these conditions:  fluid in the stomach area or lungs  if you often drink alcohol  infection or immune system problems  kidney disease or on hemodialysis  liver disease  low blood counts, like low white cell, platelet, or red cell counts  lung disease  radiation therapy  stomach ulcers  ulcerative colitis  an unusual or allergic reaction to methotrexate, other medicines, foods, dyes, or preservatives  pregnant or trying to get pregnant  breast-feeding How should I use this medicine? Take this medicine by mouth with a glass of water. Follow the directions on the prescription label. Take your medicine at regular intervals. Do not take it more often than directed. Do not stop taking except on your doctor's advice. Make sure you know why you are taking this medicine and how often you should take it. If this medicine is used for a condition that is not cancer, like arthritis or psoriasis, it should be taken weekly, NOT daily. Taking this medicine more often than directed can cause serious side effects, even death. Talk to your healthcare provider about safe handling and disposal of this medicine. You may need to take special precautions. Talk to your pediatrician regarding the use of this medicine in children. While this drug may be prescribed for selected conditions, precautions do apply. Overdosage: If you think you have taken too much of this medicine contact a poison control center or emergency  room at once. NOTE: This medicine is only for you. Do not share this medicine with others. What if I miss a dose? If you miss a dose, talk with your doctor or health care professional. Do not take double or extra doses. What may interact with this medicine? This medicine may interact with the following medication:  acitretin  aspirin and aspirin-like medicines including salicylates  azathioprine  certain antibiotics like penicillins, tetracycline, and chloramphenicol  cyclosporine  gold  hydroxychloroquine  live virus vaccines  NSAIDs, medicines for pain and inflammation, like ibuprofen or naproxen  other cytotoxic agents  penicillamine  phenylbutazone  phenytoin  probenecid  retinoids such as isotretinoin and tretinoin  steroid medicines like prednisone or cortisone  sulfonamides like sulfasalazine and trimethoprim/sulfamethoxazole  theophylline This list may not describe all possible interactions. Give your health care provider a list of all the medicines, herbs, non-prescription drugs, or dietary supplements you use. Also tell them if you smoke, drink alcohol, or use illegal drugs. Some items may interact with your medicine. What should I watch for while using this medicine? Avoid alcoholic drinks. This medicine can make you more sensitive to the sun. Keep out of the sun. If you cannot avoid being in the sun, wear protective clothing and use sunscreen. Do not use sun lamps or tanning beds/booths. You may need blood work done while you are taking this medicine. Call your doctor or health care professional for advice if you get a fever, chills or sore throat, or other symptoms of a cold or flu. Do not treat yourself. This drug decreases your body's ability  to fight infections. Try to avoid being around people who are sick. This medicine may increase your risk to bruise or bleed. Call your doctor or health care professional if you notice any unusual bleeding. Check  with your doctor or health care professional if you get an attack of severe diarrhea, nausea and vomiting, or if you sweat a lot. The loss of too much body fluid can make it dangerous for you to take this medicine. Talk to your doctor about your risk of cancer. You may be more at risk for certain types of cancers if you take this medicine. Both men and women must use effective birth control with this medicine. Do not become pregnant while taking this medicine or until at least 1 normal menstrual cycle has occurred after stopping it. Women should inform their doctor if they wish to become pregnant or think they might be pregnant. Men should not father a child while taking this medicine and for 3 months after stopping it. There is a potential for serious side effects to an unborn child. Talk to your health care professional or pharmacist for more information. Do not breast-feed an infant while taking this medicine. What side effects may I notice from receiving this medicine? Side effects that you should report to your doctor or health care professional as soon as possible:  allergic reactions like skin rash, itching or hives, swelling of the face, lips, or tongue  breathing problems or shortness of breath  diarrhea  dry, nonproductive cough  low blood counts - this medicine may decrease the number of white blood cells, red blood cells and platelets. You may be at increased risk for infections and bleeding.  mouth sores  redness, blistering, peeling or loosening of the skin, including inside the mouth  signs of infection - fever or chills, cough, sore throat, pain or trouble passing urine  signs and symptoms of bleeding such as bloody or black, tarry stools; red or dark-brown urine; spitting up blood or brown material that looks like coffee grounds; red spots on the skin; unusual bruising or bleeding from the eye, gums, or nose  signs and symptoms of kidney injury like trouble passing urine or  change in the amount of urine  signs and symptoms of liver injury like dark yellow or brown urine; general ill feeling or flu-like symptoms; light-colored stools; loss of appetite; nausea; right upper belly pain; unusually weak or tired; yellowing of the eyes or skin Side effects that usually do not require medical attention (report to your doctor or health care professional if they continue or are bothersome):  dizziness  hair loss  tiredness  upset stomach  vomiting This list may not describe all possible side effects. Call your doctor for medical advice about side effects. You may report side effects to FDA at 1-800-FDA-1088. Where should I keep my medicine? Keep out of the reach of children. Store at room temperature between 20 and 25 degrees C (68 and 77 degrees F). Protect from light. Throw away any unused medicine after the expiration date. NOTE: This sheet is a summary. It may not cover all possible information. If you have questions about this medicine, talk to your doctor, pharmacist, or health care provider.  2020 Elsevier/Gold Standard (2016-09-17 13:38:43)   Standing Labs We placed an order today for your standing lab work.    Please come back and get your standing labs in 1 month and then every 3 monthsMethotrexate tablets  We have open lab daily Monday through  Thursday from 8:30-12:30 PM and 1:30-4:30 PM and Friday from 8:30-12:30 PM and 1:30-4:00 PM at the office of Dr. Bo Merino.   You may experience shorter wait times on Monday and Friday afternoons. The office is located at [address], [place], [place], [place] [address] No appointment is necessary.   Labs are drawn by [facility].  You may receive a bill from [facility] for your lab work.  If you wish to have your labs drawn at another location, please call the office 24 hours in advance to send orders.  If you have any questions regarding directions or hours of operation,  please call [phone].    Just as a reminder please drink plenty of water prior to coming for your lab work. Thanks!

## 2019-05-11 ENCOUNTER — Other Ambulatory Visit: Payer: Self-pay | Admitting: Rheumatology

## 2019-05-11 DIAGNOSIS — Z79899 Other long term (current) drug therapy: Secondary | ICD-10-CM

## 2019-05-25 ENCOUNTER — Encounter: Payer: Self-pay | Admitting: *Deleted

## 2019-05-25 DIAGNOSIS — Z006 Encounter for examination for normal comparison and control in clinical research program: Secondary | ICD-10-CM

## 2019-05-25 NOTE — Research (Signed)
CADFEM 41 DAY PHONE [place] patient for 90 day phone call and she states she is doing well no other test ordered and no other problems. This concludes the patients participation in the study.    [place] Genese Quebedeaux  05/24/2019  13:53 p.m.

## 2019-05-30 ENCOUNTER — Telehealth: Payer: Self-pay | Admitting: Pharmacist

## 2019-05-30 ENCOUNTER — Encounter: Payer: Medicare Other | Admitting: Obstetrics & Gynecology

## 2019-05-30 NOTE — Telephone Encounter (Signed)
Urgernt care sent Rx for azithromycin and developed diarrhea, palpitations and increased fatigue.   Patient instructed to STOP taking azythromicn d/t palpitations and contact prescribed to re-assess and potential new Rx for different antibiotic.

## 2019-06-11 ENCOUNTER — Other Ambulatory Visit: Payer: Self-pay | Admitting: Rheumatology

## 2019-06-20 ENCOUNTER — Other Ambulatory Visit: Payer: Self-pay | Admitting: Pharmacist Clinician (PhC)/ Clinical Pharmacy Specialist

## 2019-06-20 DIAGNOSIS — E7849 Other hyperlipidemia: Secondary | ICD-10-CM

## 2019-06-20 LAB — LIPID PANEL
Chol/HDL Ratio: 3 ratio (ref 0.0–4.4)
Cholesterol, Total: 207 mg/dL — ABNORMAL HIGH (ref 100–199)
HDL: 70 mg/dL (ref >39)
LDL Chol Calc (NIH): 123 mg/dL — ABNORMAL HIGH (ref 0–99)
Triglycerides: 79 mg/dL (ref 0–149)
VLDL Cholesterol Cal: 14 mg/dL (ref 5–40)

## 2019-06-20 LAB — HEPATIC FUNCTION PANEL
ALT: 6 IU/L (ref 0–32)
AST: 15 IU/L (ref 0–40)
Albumin: 4.3 g/dL (ref 3.8–4.8)
Alkaline Phosphatase: 75 IU/L (ref 39–117)
Bilirubin Total: 0.4 mg/dL (ref 0.0–1.2)
Bilirubin, Direct: 0.1 mg/dL (ref 0.00–0.40)
Total Protein: 7.2 g/dL (ref 6.0–8.5)

## 2019-06-22 ENCOUNTER — Telehealth: Payer: Self-pay | Admitting: Gastroenterology

## 2019-06-22 NOTE — Telephone Encounter (Signed)
Dr. Silverio Decamp, this patient was referred for a colonoscopy an requested you as a GI MD.  Pt had previous colonoscopy in 2015 [place].  Records will be sent to you for review.  Please advise scheduling.

## 2019-07-07 ENCOUNTER — Other Ambulatory Visit: Payer: Self-pay

## 2019-07-07 ENCOUNTER — Ambulatory Visit
Admission: RE | Admit: 2019-07-07 | Discharge: 2019-07-07 | Disposition: A | Discharge status: Home / Self Care | Payer: Medicare Other | Source: Ambulatory Visit | Attending: Internal Medicine | Admitting: Internal Medicine

## 2019-07-07 ENCOUNTER — Other Ambulatory Visit: Payer: Self-pay | Admitting: Internal Medicine

## 2019-07-07 DIAGNOSIS — M810 Age-related osteoporosis without current pathological fracture: Secondary | ICD-10-CM

## 2019-07-07 DIAGNOSIS — Z1231 Encounter for screening mammogram for malignant neoplasm of breast: Secondary | ICD-10-CM

## 2019-07-07 NOTE — Progress Notes (Signed)
Bone density study confirms osteoporosis.  Referral submitted to endocrinologist.

## 2019-07-12 ENCOUNTER — Telehealth: Payer: Self-pay

## 2019-07-12 NOTE — Telephone Encounter (Signed)
-----   Message from Ladell Pier, MD sent at 07/07/2019  3:28 PM EDT ----- Bone density study confirms osteoporosis.  Referral submitted to endocrinologist.

## 2019-07-12 NOTE — Telephone Encounter (Signed)
Contacted pt to go over bone density results pt didn't answer left a detailed vm informing pt of results and if he has any questions or concerns to give a call

## 2019-07-17 ENCOUNTER — Other Ambulatory Visit: Payer: Self-pay

## 2019-07-17 ENCOUNTER — Ambulatory Visit (INDEPENDENT_AMBULATORY_CARE_PROVIDER_SITE_OTHER): Payer: Medicare Other | Admitting: Cardiovascular Disease

## 2019-07-17 ENCOUNTER — Encounter: Payer: Self-pay | Admitting: Cardiovascular Disease

## 2019-07-17 VITALS — BP 116/86 | HR 65 | Ht 67.0 in | Wt 149.0 lb

## 2019-07-17 DIAGNOSIS — E7849 Other hyperlipidemia: Secondary | ICD-10-CM | POA: Diagnosis not present

## 2019-07-17 DIAGNOSIS — Z5181 Encounter for therapeutic drug level monitoring: Secondary | ICD-10-CM | POA: Diagnosis not present

## 2019-07-17 DIAGNOSIS — R000 Tachycardia, unspecified: Secondary | ICD-10-CM | POA: Diagnosis not present

## 2019-07-17 DIAGNOSIS — I2510 Atherosclerotic heart disease of native coronary artery without angina pectoris: Secondary | ICD-10-CM

## 2019-07-17 DIAGNOSIS — I2584 Coronary atherosclerosis due to calcified coronary lesion: Secondary | ICD-10-CM | POA: Diagnosis not present

## 2019-07-17 NOTE — Progress Notes (Signed)
Cardiology Office Note   Date:  07/17/2019   ID:  Vickie Pearson, DOB August 23, 1955, MRN [id number]  PCP:  Ladell Pier, MD  Cardiologist:   Skeet Latch, MD   No chief complaint on file.    History of Present Illness: Vickie Pearson is a 64 y.o. female with inappropriate sinus tachycardia, Rheumatoid arthritis, MGUS, GERD, fibromyalgia, and familial hyperlipidemia here for follow up.  History of Present Illness:   Vickie Pearson was initially seen virtually 12/2018 with reports intermittent episodes of heart palpitations.  It was ongoing for the preceding 4 months but seemed to be happening more recently.  It feels like a fluttering sensation and occurs both at rest and with exertion.  Episode lasts between 15 and 20 seconds.  She sometimes feels lightheaded or dizzy.  Vickie Pearson was seen in the ED 11/24/2018 with chest pain and palpitations.  EKG did show 1 PVC but otherwise no significant arrhythmias.  Cardiac enzymes were negative.  She wore a 7-day event monitor that revealed no arrhythmias.  However she was noted to be tachycardic to the 140s at rest.  She reported atypical chest pain and had a coronary CT-a 02/2019 that revealed minimal obstruction.  Her calcium score was 50.3, which is 80th percentile for age and gender.   Vickie Pearson LDL was 251.  Her sister also has very high lipids. She reports that her diet is good. She tried taking pravastatin in the past but developed myalgias.  She was referred to the lipid clinic and started on Repatha.  At her last appointment metoprolol was increased to 75 mg to help with palpitations.  She took it for 1 or 2 days but her blood pressure and heart rate both got low.  Since then she has been taking it 50 mg daily.  Overall her palpitations have been pretty stable.  She still notices them 3 to 4 days/week and it lasts about 2 minutes at a time.  She has no exertional symptoms.  She is exercising at the [facility]  and feels good with exercise.  She has some occasional swelling in her left foot due to a prior injury but no orthopnea or PND.  She is working on her diet and trying to limit red meat and cheese.  Past Medical History:  Diagnosis Date  . Colon polyps   . Coronary artery calcification of native artery 02/21/2019   Minimal calcification noted 11/2018 on coronary CTA  . Costochondritis   . DDD (degenerative disc disease), cervical   . Degenerative arthritis of knee    Bilateral  . Degenerative disc disease, cervical   . Degenerative disc disease, cervical   . Fibromyalgia   . GERD (gastroesophageal reflux disease)   . Hyperlipidemia   . Inappropriate sinus tachycardia 02/21/2019  . Monoclonal gammopathy   . Multiple thyroid nodules   . Rheumatoid arthritis ([place])     No past surgical history on file.   Current Outpatient Medications  Medication Sig Dispense Refill  . aspirin EC 81 MG tablet Take 81 mg by mouth daily.    . cholecalciferol (VITAMIN D) 1000 units tablet Take 3,000 Units by mouth daily.    . Coenzyme Q10 100 MG TABS Take 200 mg by mouth daily.    . cyclobenzaprine (FLEXERIL) 5 MG tablet Take 1 tablet (5 mg total) by mouth 2 (two) times daily as needed for muscle spasms. 30 tablet 1  . Evolocumab with Infusor ([place]) 420 MG/3.5ML SOCT Inject 1  applicator into the skin every 30 (thirty) days. 3.6 mL 6  . fluticasone (FLONASE) 50 MCG/ACT nasal spray Place 1 spray into both nostrils as needed for allergies or rhinitis.    . folic acid (FOLVITE) 1 MG tablet Take 2 tablets (2 mg total) by mouth daily. 180 tablet 2  . Magnesium Citrate 100 MG TABS Take by mouth daily.    . methotrexate 50 MG/2ML injection Inject 0.8 mL into the skin once weekly. 10 mL 0  . metoprolol succinate (TOPROL-XL) 50 MG 24 hr tablet Take 50 mg by mouth daily. Take with or immediately following a meal.    . Tuberculin-Allergy Syringes 27G X 1/2" 1 ML MISC Use 1 syringe weekly to inject  methotrexate. 12 each 2  . vitamin B-12 (CYANOCOBALAMIN) 1000 MCG tablet Take 1 tablet by mouth daily.    Marland Kitchen zinc sulfate 220 (50 Zn) MG capsule Take by mouth 3 (three) times daily.     No current facility-administered medications for this visit.    Allergies:   Penicillins, Piroxicam, and Sulfa antibiotics    Social History:  The patient  reports that she quit smoking about 31 years ago. Her smoking use included cigarettes. She smoked 1.00 pack per day. She has never used smokeless tobacco. She reports current alcohol use. She reports that she does not use drugs.   Family History:  The patient's family history includes Arthritis in her father and mother; Colon polyps in her mother; Heart disease in her father; Hyperlipidemia in her brother and sister; Hypertension in her brother, father, mother, and sister; Kidney disease in her father; Stroke in her mother.    ROS:  Please see the history of present illness.   Otherwise, review of systems are positive for none.   All other systems are reviewed and negative.    PHYSICAL EXAM: VS:  BP 116/86   Pulse 65   Ht 5\' 7"  (1.702 m)   Wt 149 lb (67.6 kg)   SpO2 99%   BMI 23.34 kg/m  , BMI Body mass index is 23.34 kg/m. GENERAL:  Well appearing HEENT: Pupils equal round and reactive, fundi not visualized, oral mucosa unremarkable NECK:  No jugular venous distention, waveform within normal limits, carotid upstroke brisk and symmetric, no bruits LUNGS:  Clear to auscultation bilaterally HEART:  RRR.  PMI not displaced or sustained,S1 and S2 within normal limits, no S3, no S4, no clicks, no rubs, no murmurs ABD:  Flat, positive bowel sounds normal in frequency in pitch, no bruits, no rebound, no guarding, no midline pulsatile mass, no hepatomegaly, no splenomegaly EXT:  2 plus pulses throughout, no edema, no cyanosis no clubbing SKIN:  No rashes no nodules NEURO:  Cranial nerves II through XII grossly intact, motor grossly intact  throughout PSYCH:  Cognitively intact, oriented to person place and time  EKG:  EKG is ordered today. The ekg ordered 11/24/18 demonstrates sinus rhythm.  Rate 81 bpm.  PVC. 07/17/2019: Sinus rhythm.  Rate 65 bpm.  Recent Labs: 11/24/2018: TSH 0.935 02/15/2019: BUN 10; Creatinine 0.84; Hemoglobin 13.3; Platelet Count 278; Potassium 3.8; Sodium 140 06/20/2019: ALT 6    Lipid Panel    Component Value Date/Time   CHOL 207 (H) 06/20/2019 0952   TRIG 79 06/20/2019 0952   HDL 70 06/20/2019 0952   CHOLHDL 3.0 06/20/2019 0952   LDLCALC 123 (H) 06/20/2019 0952      Wt Readings from Last 3 Encounters:  07/17/19 149 lb (67.6 kg)  05/09/19 152 lb (  68.9 kg)  04/21/19 149 lb 12.8 oz (67.9 kg)      ASSESSMENT AND PLAN:  # Palpitations: # Inappropriate sinus tachycardia: Vickie Pearson wore a monitor that did not show any arrhythmias.  However she did have sinus tachycardia up to the 140s at rest.    Overall symptoms are pretty stable at metoprolol 50 mg daily.  # Non-obstructive CAD: # Familial hyperlipidemia: Vickie Pearson had minimal coronary artery disease on cardiac CT-A, but her calcium score was 80th percentile.  Her LDL was 251 with a total cholesterol 347.    And improved significantly with Repatha.  However she is not yet to goal.  We discussed starting Zetia but she would like to continue working on diet and exercise for now.  We will repeat lipids and a CMP in 6 months and reassess.  She remains uninterested in trying any other statins.      Current medicines are reviewed at length with the patient today.  The patient does not have concerns regarding medicines.  The following changes have been made: Increase metoprolol  Labs/ tests ordered today include:   Orders Placed This Encounter  Procedures  . Lipid panel  . Comprehensive metabolic panel  . EKG 12-Lead     Disposition:   FU with Tyberius Ryner C. Oval Linsey, MD, [facility] in 1 year    Signed, Marrissa Dai C. Oval Linsey,  MD, [facility]  07/17/2019 5:29 PM    [facility]

## 2019-07-17 NOTE — Patient Instructions (Signed)
Medication Instructions:  Your physician recommends that you continue on your current medications as directed. Please refer to the Current Medication list given to you today.  *If you need a refill on your cardiac medications before your next appointment, please call your pharmacy*  Lab Work: FASTING LP/CMET IN 6 MONTHS   If you have labs (blood work) drawn today and your tests are completely normal, you will receive your results only by: Marland Kitchen MyChart Message (if you have MyChart) OR . A paper copy in the mail If you have any lab test that is abnormal or we need to change your treatment, we will call you to review the results.  Testing/Procedures: NONE  Follow-Up: At [facility], you and your health needs are our priority.  As part of our continuing mission to provide you with exceptional heart care, we have created designated Provider Care Teams.  These Care Teams include your primary Cardiologist (physician) and Advanced Practice Providers (APPs -  Physician Assistants and Nurse Practitioners) who all work together to provide you with the care you need, when you need it.  We recommend signing up for the patient portal called "MyChart".  Sign up information is provided on this After Visit Summary.  MyChart is used to connect with patients for Virtual Visits (Telemedicine).  Patients are able to view lab/test results, encounter notes, upcoming appointments, etc.  Non-urgent messages can be sent to your provider as well.   To learn more about what you can do with MyChart, go to [email or web address].    Your next appointment:   12 month(s) \You will receive a reminder letter in the mail two months in advance. If you don't receive a letter, please call our office to schedule the follow-up appointment.  The format for your next appointment:   In Person  Provider:   You may see DR [facility]  or one of the following Advanced Practice Providers on your designated Care Team:    Kerin Ransom,  PA-C  [place], [place]  Coletta Memos, [place]

## 2019-07-19 NOTE — Telephone Encounter (Signed)
Pt called to check on status of records.

## 2019-07-20 ENCOUNTER — Encounter: Payer: Self-pay | Admitting: Gastroenterology

## 2019-09-01 ENCOUNTER — Telehealth: Payer: Self-pay | Admitting: Hematology and Oncology

## 2019-09-01 NOTE — Telephone Encounter (Signed)
Scheduled per 1/6 los now that MD template is open. Called and left msg. Mailed printout

## 2019-09-05 ENCOUNTER — Ambulatory Visit (AMBULATORY_SURGERY_CENTER): Payer: Self-pay

## 2019-09-05 ENCOUNTER — Other Ambulatory Visit: Payer: Self-pay

## 2019-09-05 VITALS — Ht 67.0 in | Wt 150.0 lb

## 2019-09-05 DIAGNOSIS — Z86010 Personal history of colonic polyps: Secondary | ICD-10-CM

## 2019-09-05 MED ORDER — SUTAB 1479-225-188 MG PO TABS
12.0000 | ORAL_TABLET | ORAL | 0 refills | Status: DC
Start: 2019-09-05 — End: 2019-09-19

## 2019-09-05 NOTE — Progress Notes (Signed)
No egg or soy allergy known to patient  No issues with past sedation with any surgeries or procedures no intubation problems in the past  No diet pills per patient No home 02 use per patient  No blood thinners per patient  Pt denies issues with constipation  No A fib or A flutter  EMMI video to pt or MyChart  COVID 19 guidelines implemented in [facility] today   Sutab Code and  Coupon given to pt in [place] today   Pt has been vaccinated for covid  Due to the COVID-19 pandemic we are asking patients to follow these guidelines. Please only bring one care partner. Please be aware that your care partner may wait in the car in the parking lot or if they feel like they will be too hot to wait in the car, they may wait in the lobby on the 4th floor. All care partners are required to wear a mask the entire time (we do not have any that we can provide them), they need to practice social distancing, and we will do a Covid check for all patient's and care partners when you arrive. Also we will check their temperature and your temperature. If the care partner waits in their car they need to stay in the parking lot the entire time and we will call them on their cell phone when the patient is ready for discharge so they can bring the car to the front of the building. Also all patient's will need to wear a mask into building.

## 2019-09-06 ENCOUNTER — Telehealth: Payer: Self-pay | Admitting: Gastroenterology

## 2019-09-06 NOTE — Telephone Encounter (Signed)
Returned pts call.   She states that she does not feel like she can swallow the pills with this prep.  Informed her that I would print off new instructions for a Miralax prep and leave at the front desk on the 3rd floor.  Pt advised to call back with any questions.

## 2019-09-13 ENCOUNTER — Ambulatory Visit: Payer: Medicare Other | Admitting: Endocrinology

## 2019-09-19 ENCOUNTER — Other Ambulatory Visit: Payer: Self-pay

## 2019-09-19 ENCOUNTER — Ambulatory Visit (AMBULATORY_SURGERY_CENTER): Payer: Medicare Other | Admitting: Gastroenterology

## 2019-09-19 ENCOUNTER — Encounter: Payer: Self-pay | Admitting: Gastroenterology

## 2019-09-19 VITALS — BP 118/69 | HR 64 | Temp 97.1°F | Resp 16 | Ht 67.0 in | Wt 150.0 lb

## 2019-09-19 DIAGNOSIS — D123 Benign neoplasm of transverse colon: Secondary | ICD-10-CM | POA: Diagnosis not present

## 2019-09-19 DIAGNOSIS — D122 Benign neoplasm of ascending colon: Secondary | ICD-10-CM | POA: Diagnosis not present

## 2019-09-19 DIAGNOSIS — Z86010 Personal history of colonic polyps: Secondary | ICD-10-CM

## 2019-09-19 MED ORDER — SODIUM CHLORIDE 0.9 % IV SOLN: 500.0000 mL | Freq: Once | INTRAVENOUS | Status: DC

## 2019-09-19 NOTE — Progress Notes (Signed)
Called to room to assist during endoscopic procedure.  Patient ID and intended procedure confirmed with present staff. Received instructions for my participation in the procedure from the performing physician.  

## 2019-09-19 NOTE — Patient Instructions (Signed)
Handout on polyps given to you today  Await pathology results   YOU HAD AN ENDOSCOPIC PROCEDURE TODAY AT THE [facility]:   Refer to the procedure report that was given to you for any specific questions about what was found during the examination.  If the procedure report does not answer your questions, please call your gastroenterologist to clarify.  If you requested that your care partner not be given the details of your procedure findings, then the procedure report has been included in a sealed envelope for you to review at your convenience later.  YOU SHOULD EXPECT: Some feelings of bloating in the abdomen. Passage of more gas than usual.  Walking can help get rid of the air that was put into your GI tract during the procedure and reduce the bloating. If you had a lower endoscopy (such as a colonoscopy or flexible sigmoidoscopy) you may notice spotting of blood in your stool or on the toilet paper. If you underwent a bowel prep for your procedure, you may not have a normal bowel movement for a few days.  Please Note:  You might notice some irritation and congestion in your nose or some drainage.  This is from the oxygen used during your procedure.  There is no need for concern and it should clear up in a day or so.  SYMPTOMS TO REPORT IMMEDIATELY:   Following lower endoscopy (colonoscopy or flexible sigmoidoscopy):  Excessive amounts of blood in the stool  Significant tenderness or worsening of abdominal pains  Swelling of the abdomen that is new, acute  Fever of 100F or higher  For urgent or emergent issues, a gastroenterologist can be reached at any hour by calling [phone]. Do not use MyChart messaging for urgent concerns.    DIET:  We do recommend a small meal at first, but then you may proceed to your regular diet.  Drink plenty of fluids but you should avoid alcoholic beverages for 24 hours.  ACTIVITY:  You should plan to take it easy for the rest of today and  you should NOT DRIVE or use heavy machinery until tomorrow (because of the sedation medicines used during the test).    FOLLOW UP: Our staff will call the number listed on your records 48-72 hours following your procedure to check on you and address any questions or concerns that you may have regarding the information given to you following your procedure. If we do not reach you, we will leave a message.  We will attempt to reach you two times.  During this call, we will ask if you have developed any symptoms of COVID 19. If you develop any symptoms (ie: fever, flu-like symptoms, shortness of breath, cough etc.) before then, please call [phone].  If you test positive for Covid 19 in the 2 weeks post procedure, please call and report this information to us.    If any biopsies were taken you will be contacted by phone or by letter within the next 1-3 weeks.  Please call us at [phone] if you have not heard about the biopsies in 3 weeks.    SIGNATURES/CONFIDENTIALITY: You and/or your care partner have signed paperwork which will be entered into your electronic medical record.  These signatures attest to the fact that that the information above on your After Visit Summary has been reviewed and is understood.  Full responsibility of the confidentiality of this discharge information lies with you and/or your care-partner. 

## 2019-09-19 NOTE — Progress Notes (Signed)
VS-CW  Pt's states no medical or surgical changes since previsit or office visit.  

## 2019-09-19 NOTE — Op Note (Signed)
[place] Patient Name: Vickie Pearson Procedure Date: 09/19/2019 8:27 AM MRN: [id number] Endoscopist: Mauri Pole , MD Age: 64 Referring MD:  Date of Birth: 15-Oct-1955 Gender: Female Account #: [id number] Procedure:                Colonoscopy Indications:              High risk colon cancer surveillance: Personal                            history of colonic polyps, Last colonoscopy: date                            unknown (unable to locate last colonoscopy report) Medicines:                Monitored Anesthesia Care Procedure:                Pre-Anesthesia Assessment:                           - Prior to the procedure, a History and Physical                            was performed, and patient medications and                            allergies were reviewed. The patient's tolerance of                            previous anesthesia was also reviewed. The risks                            and benefits of the procedure and the sedation                            options and risks were discussed with the patient.                            All questions were answered, and informed consent                            was obtained. Prior Anticoagulants: The patient has                            taken no previous anticoagulant or antiplatelet                            agents. ASA Grade Assessment: II - A patient with                            mild systemic disease. After reviewing the risks                            and benefits, the patient was deemed in  satisfactory condition to undergo the procedure.                           After obtaining informed consent, the colonoscope                            was passed under direct vision. Throughout the                            procedure, the patient's blood pressure, pulse, and                            oxygen saturations were monitored continuously. The                             Colonoscope was introduced through the anus and                            advanced to the the cecum, identified by                            appendiceal orifice and ileocecal valve. The                            colonoscopy was performed without difficulty. The                            patient tolerated the procedure well. The quality                            of the bowel preparation was good. The ileocecal                            valve, appendiceal orifice, and rectum were                            photographed. Scope In: 8:33:47 AM Scope Out: 8:52:10 AM Scope Withdrawal Time: 0 hours 13 minutes 17 seconds  Total Procedure Duration: 0 hours 18 minutes 23 seconds  Findings:                 The perianal and digital rectal examinations were                            normal.                           Two sessile polyps were found in the transverse                            colon and ascending colon. The polyps were 5 to 8                            mm in size. These polyps were removed with a cold  snare. Resection and retrieval were complete.                           Non-bleeding internal hemorrhoids were found during                            retroflexion. The hemorrhoids were medium-sized. Complications:            No immediate complications. Estimated Blood Loss:     Estimated blood loss was minimal. Impression:               - Two 5 to 8 mm polyps in the transverse colon and                            in the ascending colon, removed with a cold snare.                            Resected and retrieved.                           - Non-bleeding internal hemorrhoids. Recommendation:           - Patient has a contact number available for                            emergencies. The signs and symptoms of potential                            delayed complications were discussed with the                            patient. Return to normal activities  tomorrow.                            Written discharge instructions were provided to the                            patient.                           - Resume previous diet.                           - Continue present medications.                           - Await pathology results.                           - Repeat colonoscopy in 5-10 years for surveillance                            based on pathology results. Mauri Pole, MD 09/19/2019 8:57:08 AM This report has been signed electronically.

## 2019-09-19 NOTE — Progress Notes (Signed)
To PACU, VSS. Report to [email or web address] 

## 2019-09-21 ENCOUNTER — Other Ambulatory Visit: Payer: Self-pay | Admitting: Internal Medicine

## 2019-09-21 ENCOUNTER — Telehealth: Payer: Self-pay | Admitting: *Deleted

## 2019-09-21 ENCOUNTER — Telehealth: Payer: Self-pay

## 2019-09-21 DIAGNOSIS — M62838 Other muscle spasm: Secondary | ICD-10-CM

## 2019-09-21 NOTE — Telephone Encounter (Signed)
Requested medication (s) are due for refill today: no  Requested medication (s) are on the active medication list: yes  Last refill:  04/21/2019  Future visit scheduled: yes  Notes to clinic:  this refill cannot be delegated    Requested Prescriptions  Pending Prescriptions Disp Refills   cyclobenzaprine (FLEXERIL) 5 MG tablet 30 tablet 1    Sig: Take 1 tablet (5 mg total) by mouth 2 (two) times daily as needed for muscle spasms.      Not Delegated - Analgesics:  Muscle Relaxants Failed - 09/21/2019 11:13 AM      Failed - This refill cannot be delegated      Passed - Valid encounter within last 6 months    Recent Outpatient Visits           5 months ago Familial hyperlipidemia   [place] Karle Plumber B, MD   2 years ago Rheumatoid arthritis involving multiple sites, unspecified rheumatoid factor presence [facility])   [place] Karle Plumber B, MD   3 years ago Rheumatoid arthritis involving multiple sites, unspecified rheumatoid factor presence [facility])   [place] [facility] Ladell Pier, MD

## 2019-09-21 NOTE — Telephone Encounter (Signed)
Attempted f/u phone call. No answer. Left message. °

## 2019-09-21 NOTE — Telephone Encounter (Signed)
  Follow up Call-  Call back number 09/19/2019  Post procedure Call Back phone  # [phone]  Permission to leave phone message Yes  Some recent data might be hidden     Patient questions:  Do you have a fever, pain , or abdominal swelling? No. Pain Score  0 *  Have you tolerated food without any problems? Yes.    Have you been able to return to your normal activities? Yes.    Do you have any questions about your discharge instructions: Diet   No. Medications  No. Follow up visit  No.  Do you have questions or concerns about your Care? No.  Actions: * If pain score is 4 or above: No action needed, pain <4.  1. Have you developed a fever since your procedure? no  2.   Have you had an respiratory symptoms (SOB or cough) since your procedure? no  3.   Have you tested positive for COVID 19 since your procedure no  4.   Have you had any family members/close contacts diagnosed with the COVID 19 since your procedure?  no   If yes to any of these questions please route to Joylene John, RN and Joella Prince, RN

## 2019-09-21 NOTE — Telephone Encounter (Signed)
Medication Refill - Medication: cyclobenzaprine (FLEXERIL) 5 MG tablet    Preferred Pharmacy (with phone number or street name):  [place], [address] Phone:  [phone]  Fax:  [phone]       Agent: Please be advised that RX refills may take up to 3 business days. We ask that you follow-up with your pharmacy.

## 2019-09-22 MED ORDER — CYCLOBENZAPRINE HCL 5 MG PO TABS: 5.0000 mg | ORAL_TABLET | Freq: Two times a day (BID) | ORAL | 1 refills | Status: DC | PRN

## 2019-09-27 ENCOUNTER — Other Ambulatory Visit: Payer: Self-pay

## 2019-09-27 ENCOUNTER — Telehealth: Payer: Self-pay

## 2019-09-27 ENCOUNTER — Encounter: Payer: Self-pay | Admitting: Endocrinology

## 2019-09-27 ENCOUNTER — Ambulatory Visit (INDEPENDENT_AMBULATORY_CARE_PROVIDER_SITE_OTHER): Payer: Medicare Other | Admitting: Endocrinology

## 2019-09-27 VITALS — BP 120/84 | HR 68 | Ht 67.0 in | Wt 150.6 lb

## 2019-09-27 DIAGNOSIS — E042 Nontoxic multinodular goiter: Secondary | ICD-10-CM | POA: Diagnosis not present

## 2019-09-27 DIAGNOSIS — M810 Age-related osteoporosis without current pathological fracture: Secondary | ICD-10-CM

## 2019-09-27 DIAGNOSIS — E559 Vitamin D deficiency, unspecified: Secondary | ICD-10-CM

## 2019-09-27 DIAGNOSIS — I2510 Atherosclerotic heart disease of native coronary artery without angina pectoris: Secondary | ICD-10-CM | POA: Diagnosis not present

## 2019-09-27 DIAGNOSIS — I2584 Coronary atherosclerosis due to calcified coronary lesion: Secondary | ICD-10-CM | POA: Diagnosis not present

## 2019-09-27 NOTE — Telephone Encounter (Signed)
Discussed lab ordered today (BMP) with phlebotomist re: possibility of adding test on to those obtained today. Confirmed this could be done. Called pt and canceled lab appt for 10/04/19.

## 2019-09-27 NOTE — Telephone Encounter (Signed)
Pt seen today as NP osteoporosis. Per Dr. Cordelia Pen orders, pt will need to begin auth process for Reclast. Routing this message to [facility], LPN.

## 2019-09-27 NOTE — Telephone Encounter (Signed)
done

## 2019-09-27 NOTE — Patient Instructions (Addendum)
Blood tests are requested for you today.  We'll let you know about the results.   °Take calcium 1200 mg per day.   °We should resume the Reclast.  you will receive a phone call, about a day and time for an appointment.   °Let's recheck the ultrasound.  you will receive a phone call, about a day and time for an appointment. °Please come back for a follow-up appointment in 1 year.   ° ° ° ° °Osteoporosis ° °Osteoporosis is thinning and loss of density in your bones. Osteoporosis makes bones more brittle and fragile and more likely to break (fracture). Over time, osteoporosis can cause your bones to become so weak that they fracture after a minor fall. Bones in the hip, wrist, and spine are most likely to fracture due to osteoporosis. °What are the causes? °The exact cause of this condition is not known. °What increases the risk? °You may be at greater risk for osteoporosis if you: °· Have a family history of the condition. °· Have poor nutrition. °· Use steroid medicines, such as prednisone. °· Are female. °· Are age 50 or older. °· Smoke or have a history of smoking. °· Are not physically active (are sedentary). °· Are white (Caucasian) or of Asian descent. °· Have a small body frame. °· Take certain medicines, such as antiseizure medicines. °What are the signs or symptoms? °A fracture might be the first sign of osteoporosis, especially if the fracture results from a fall or injury that usually would not cause a bone to break. Other signs and symptoms include: °· Pain in the neck or low back. °· Stooped posture. °· Loss of height. °How is this diagnosed? °This condition may be diagnosed based on: °· Your medical history. °· A physical exam. °· A bone mineral density test, also called a DXA or DEXA test (dual-energy X-ray absorptiometry test). This test uses X-rays to measure the amount of minerals in your bones. °How is this treated? °The goal of treatment is to strengthen your bones and lower your risk for a  fracture. Treatment may involve: °· Making lifestyle changes, such as: °? Including foods with more calcium and vitamin D in your diet. °? Doing weight-bearing and muscle-strengthening exercises. °? Stopping tobacco use. °? Limiting alcohol intake. °· Taking medicine to slow the process of bone loss or to increase bone density. °· Taking daily supplements of calcium and vitamin D. °· Taking hormone replacement medicines, such as estrogen for women and testosterone for men. °· Monitoring your levels of calcium and vitamin D. °Follow these instructions at home: ° °Activity °· Exercise as told by your health care provider. Ask your health care provider what exercises and activities are safe for you. You should do: °? Exercises that make you work against gravity (weight-bearing exercises), such as tai chi, yoga, or walking. °? Exercises to strengthen muscles, such as lifting weights. °Lifestyle °· Limit alcohol intake to no more than 1 drink a day for nonpregnant women and 2 drinks a day for men. One drink equals 12 oz of beer, 5 oz of wine, or 1½ oz of hard liquor. °· Do not use any products that contain nicotine or tobacco, such as cigarettes and e-cigarettes. If you need help quitting, ask your health care provider. °Preventing falls °· Use devices to help you move around (mobility aids) as needed, such as canes, walkers, scooters, or crutches. °· Keep rooms well-lit and clutter-free. °· Remove tripping hazards from walkways, including cords and throw rugs. °·   Install grab bars in bathrooms and safety rails on stairs. °· Use rubber mats in the bathroom and other areas that are often wet or slippery. °· Wear closed-toe shoes that fit well and support your feet. Wear shoes that have rubber soles or low heels. °· Review your medicines with your health care provider. Some medicines can cause dizziness or changes in blood pressure, which can increase your risk of falling. °General instructions °· Include calcium and  vitamin D in your diet. Calcium is important for bone health, and vitamin D helps your body to absorb calcium. Good sources of calcium and vitamin D include: °? Certain fatty fish, such as salmon and tuna. °? Products that have calcium and vitamin D added to them (fortified products), such as fortified cereals. °? Egg yolks. °? Cheese. °? Liver. °· Take over-the-counter and prescription medicines only as told by your health care provider. °· Keep all follow-up visits as told by your health care provider. This is important. °Contact a health care provider if: °· You have never been screened for osteoporosis and you are: °? A woman who is age 65 or older. °? A man who is age 70 or older. °Get help right away if: °· You fall or injure yourself. °Summary °· Osteoporosis is thinning and loss of density in your bones. This makes bones more brittle and fragile and more likely to break (fracture),even with minor falls. °· The goal of treatment is to strengthen your bones and reduce your risk for a fracture. °· Include calcium and vitamin D in your diet. Calcium is important for bone health, and vitamin D helps your body to absorb calcium. °· Talk with your health care provider about screening for osteoporosis if you are a woman who is age 65 or older, or a man who is age 70 or older. °This information is not intended to replace advice given to you by your health care provider. Make sure you discuss any questions you have with your health care provider. °Document Revised: 01/08/2017 Document Reviewed: 11/20/2016 °Elsevier Patient Education © 2020 [employer] ° °

## 2019-09-27 NOTE — Telephone Encounter (Signed)
Next Appt With Internal Medicine ([facility]) 10/04/2019 at 10:15 AM  Please review and sign off orders

## 2019-09-27 NOTE — Progress Notes (Signed)
Subjective:    Patient ID: Vickie Pearson, female    DOB: [birth year], 64 y.o.   MRN: [id number]  HPI Pt is referred by Dr Wynetta Emery, for osteoporosis.  Pt was noted to have osteoporosis in 2018.  She took 1 infusion of Reclast in 2018 (when she lived in [place]).  she has never had bony fracture.  She has no history of any of the following: early menopause, multiple myeloma, renal dz, alcoholism, liver dz, primary hyperparathyroidism.  She does not take heparin or anticonvulsants.  She took prednisone x 2-3 years for RA (stopped 2018).  She gets intermitt steroid injections into the knees (but only once in the past few years).  She quit smoking in 1989 (after smoking x 10 years).  She takes vit-D, 3000 units/d.   She also has h/o MNG (dx'ed approx 2019; she has never had thyroid bx; she has been euthyroid off any rx).   Past Medical History:  Diagnosis Date  . Colon polyps   . Coronary artery calcification of native artery 02/21/2019   Minimal calcification noted 11/2018 on coronary CTA  . Costochondritis   . DDD (degenerative disc disease), cervical   . Degenerative arthritis of knee    Bilateral  . Degenerative disc disease, cervical   . Degenerative disc disease, cervical   . Fibromyalgia   . GERD (gastroesophageal reflux disease)   . Hyperlipidemia   . Inappropriate sinus tachycardia 02/21/2019  . Monoclonal gammopathy   . Multiple thyroid nodules   . Osteoporosis   . Rheumatoid arthritis [facility])     Past Surgical History:  Procedure Laterality Date  . COLONOSCOPY  2012  . COLONOSCOPY  2015  . POLYPECTOMY      Social History   Socioeconomic History  . Marital status: Widowed    Spouse name: Not on file  . Number of children: Not on file  . Years of education: Not on file  . Highest education level: Not on file  Occupational History  . Not on file  Tobacco Use  . Smoking status: Former Smoker    Packs/day: 1.00    Types: Cigarettes    Quit date: 07/24/1988    Years  since quitting: 31.2  . Smokeless tobacco: Never Used  Vaping Use  . Vaping Use: Never used  Substance and Sexual Activity  . Alcohol use: Yes    Comment: occasionally  . Drug use: No  . Sexual activity: Not on file  Other Topics Concern  . Not on file  Social History Narrative  . Not on file   Social Determinants of Health   Financial Resource Strain:   . Difficulty of Paying Living Expenses: Not on file  Food Insecurity:   . Worried About Charity fundraiser in the Last Year: Not on file  . Ran Out of Food in the Last Year: Not on file  Transportation Needs:   . Lack of Transportation (Medical): Not on file  . Lack of Transportation (Non-Medical): Not on file  Physical Activity:   . Days of Exercise per Week: Not on file  . Minutes of Exercise per Session: Not on file  Stress:   . Feeling of Stress : Not on file  Social Connections:   . Frequency of Communication with Friends and Family: Not on file  . Frequency of Social Gatherings with Friends and Family: Not on file  . Attends Religious Services: Not on file  . Active Member of Clubs or Organizations: Not on file  .  Attends Archivist Meetings: Not on file  . Marital Status: Not on file  Intimate Partner Violence:   . Fear of Current or Ex-Partner: Not on file  . Emotionally Abused: Not on file  . Physically Abused: Not on file  . Sexually Abused: Not on file    Current Outpatient Medications on File Prior to Visit  Medication Sig Dispense Refill  . aspirin EC 81 MG tablet Take 81 mg by mouth daily.    . cholecalciferol (VITAMIN D) 1000 units tablet Take 3,000 Units by mouth daily.    . cyclobenzaprine (FLEXERIL) 5 MG tablet Take 1 tablet (5 mg total) by mouth 2 (two) times daily as needed for muscle spasms. 30 tablet 1  . Evolocumab with Infusor ([place]) 420 MG/3.5ML SOCT Inject 1 applicator into the skin every 30 (thirty) days. 3.6 mL 6  . fluticasone (FLONASE) 50 MCG/ACT nasal  spray Place 1 spray into both nostrils as needed for allergies or rhinitis.    . Lactobacillus (PROBIOTIC ACIDOPHILUS PO) Take 1 capsule by mouth daily.    . Magnesium Citrate 100 MG TABS Take by mouth daily as needed.     . metoprolol succinate (TOPROL-XL) 50 MG 24 hr tablet Take 50 mg by mouth daily. Take with or immediately following a meal.    . vitamin B-12 (CYANOCOBALAMIN) 1000 MCG tablet Take 1 tablet by mouth daily.    Marland Kitchen zinc sulfate 220 (50 Zn) MG capsule Take by mouth daily.     . Coenzyme Q10 100 MG TABS Take 200 mg by mouth daily. (Patient not taking: Reported on 09/19/2019)    . folic acid (FOLVITE) 1 MG tablet Take 2 tablets (2 mg total) by mouth daily. (Patient not taking: Reported on 09/19/2019) 180 tablet 2   No current facility-administered medications on file prior to visit.    Allergies  Allergen Reactions  . Penicillins Hives    Has patient had a PCN reaction causing immediate rash, facial/tongue/throat swelling, SOB or lightheadedness with hypotension: No Has patient had a PCN reaction causing severe rash involving mucus membranes or skin necrosis: Yes Has patient had a PCN reaction that required hospitalization: No Has patient had a PCN reaction occurring within the last 10 years: No If all of the above answers are "NO", then may proceed with Cephalosporin use.   . Piroxicam Hives  . Sulfa Antibiotics Hives    Family History  Problem Relation Age of Onset  . Hypertension Mother   . Colon polyps Mother   . Stroke Mother   . Arthritis Mother   . Hypertension Father   . Heart disease Father   . Arthritis Father   . Kidney disease Father   . Hypertension Sister   . Hyperlipidemia Sister   . Hypertension Brother   . Hyperlipidemia Brother   . Esophageal cancer Neg Hx   . Rectal cancer Neg Hx   . Stomach cancer Neg Hx   . Osteoporosis Neg Hx     BP 120/84   Pulse 68   Ht _0  (1.702 m)   Wt 150 lb 9.6 oz (68.3 kg)   SpO2 98%   BMI 23.59 kg/m     Review of Systems denies and falls.  She has chronic intermitt back pain and muscle cramps.  She has intermitt heartburn.  She has gained a few lbs.      Objective:   Physical Exam VITAL SIGNS:  See vs page GENERAL: no distress CHEST WALL: no  kyphosis.  GAIT: normal and steady.     Lab Results  Component Value Date   CALCIUM 9.6 02/15/2019   Lab Results  Component Value Date   CREATININE 0.84 02/15/2019   BUN 10 02/15/2019   NA 140 02/15/2019   K 3.8 02/15/2019   CL 106 02/15/2019   CO2 28 02/15/2019   Lab Results  Component Value Date   TSH 0.935 11/24/2018    DEXA (2021): worst T-score is -3.0 ([facility]).    I have reviewed outside records, and summarized: Pt was noted to have osteoporosis, and referred here.  RA, CAD, and dyslipidemia were also addressed.      Assessment & Plan:  Osteoporosis: uncontrolled. she needs to resume rx MNG: uncertain etiology   Patient Instructions  Blood tests are requested for you today.  We'll let you know about the results.   Take calcium 1200 mg per day.   We should resume the Reclast.  you will receive a phone call, about a day and time for an appointment.   Let's recheck the ultrasound.  you will receive a phone call, about a day and time for an appointment. Please come back for a follow-up appointment in 1 year.       Osteoporosis  Osteoporosis is thinning and loss of density in your bones. Osteoporosis makes bones more brittle and fragile and more likely to break (fracture). Over time, osteoporosis can cause your bones to become so weak that they fracture after a minor fall. Bones in the hip, wrist, and spine are most likely to fracture due to osteoporosis. What are the causes? The exact cause of this condition is not known. What increases the risk? You may be at greater risk for osteoporosis if you:  Have a family history of the condition.  Have poor nutrition.  Use steroid medicines, such as prednisone.  Are  female.  Are age 19 or older.  Smoke or have a history of smoking.  Are not physically active (are sedentary).  Are white (Caucasian) or of Asian descent.  Have a small body frame.  Take certain medicines, such as antiseizure medicines. What are the signs or symptoms? A fracture might be the first sign of osteoporosis, especially if the fracture results from a fall or injury that usually would not cause a bone to break. Other signs and symptoms include:  Pain in the neck or low back.  Stooped posture.  Loss of height. How is this diagnosed? This condition may be diagnosed based on:  Your medical history.  A physical exam.  A bone mineral density test, also called a DXA or DEXA test (dual-energy X-ray absorptiometry test). This test uses X-rays to measure the amount of minerals in your bones. How is this treated? The goal of treatment is to strengthen your bones and lower your risk for a fracture. Treatment may involve:  Making lifestyle changes, such as: ? Including foods with more calcium and vitamin D in your diet. ? Doing weight-bearing and muscle-strengthening exercises. ? Stopping tobacco use. ? Limiting alcohol intake.  Taking medicine to slow the process of bone loss or to increase bone density.  Taking daily supplements of calcium and vitamin D.  Taking hormone replacement medicines, such as estrogen for women and testosterone for men.  Monitoring your levels of calcium and vitamin D. Follow these instructions at home:  Activity  Exercise as told by your health care provider. Ask your health care provider what exercises and activities are safe for you.  You should do: ? Exercises that make you work against gravity (weight-bearing exercises), such as tai chi, yoga, or walking. ? Exercises to strengthen muscles, such as lifting weights. Lifestyle  Limit alcohol intake to no more than 1 drink a day for nonpregnant women and 2 drinks a day for men. One drink  equals 12 oz of beer, 5 oz of wine, or 1 oz of hard liquor.  Do not use any products that contain nicotine or tobacco, such as cigarettes and e-cigarettes. If you need help quitting, ask your health care provider. Preventing falls  Use devices to help you move around (mobility aids) as needed, such as canes, walkers, scooters, or crutches.  Keep rooms well-lit and clutter-free.  Remove tripping hazards from walkways, including cords and throw rugs.  Install grab bars in bathrooms and safety rails on stairs.  Use rubber mats in the bathroom and other areas that are often wet or slippery.  Wear closed-toe shoes that fit well and support your feet. Wear shoes that have rubber soles or low heels.  Review your medicines with your health care provider. Some medicines can cause dizziness or changes in blood pressure, which can increase your risk of falling. General instructions  Include calcium and vitamin D in your diet. Calcium is important for bone health, and vitamin D helps your body to absorb calcium. Good sources of calcium and vitamin D include: ? Certain fatty fish, such as salmon and tuna. ? Products that have calcium and vitamin D added to them (fortified products), such as fortified cereals. ? Egg yolks. ? Cheese. ? Liver.  Take over-the-counter and prescription medicines only as told by your health care provider.  Keep all follow-up visits as told by your health care provider. This is important. Contact a health care provider if:  You have never been screened for osteoporosis and you are: ? A woman who is age 62 or older. ? A man who is age 29 or older. Get help right away if:  You fall or injure yourself. Summary  Osteoporosis is thinning and loss of density in your bones. This makes bones more brittle and fragile and more likely to break (fracture),even with minor falls.  The goal of treatment is to strengthen your bones and reduce your risk for a  fracture.  Include calcium and vitamin D in your diet. Calcium is important for bone health, and vitamin D helps your body to absorb calcium.  Talk with your health care provider about screening for osteoporosis if you are a woman who is age 5 or older, or a man who is age 44 or older. This information is not intended to replace advice given to you by your health care provider. Make sure you discuss any questions you have with your health care provider. Document Revised: 01/08/2017 Document Reviewed: 11/20/2016 Elsevier Patient Education  2020 [employer].

## 2019-09-27 NOTE — Telephone Encounter (Signed)
Pt has been scheduled for BMP next Wednesday and lab has been ordered and sent to Dr. Loanne Drilling to sign off on.

## 2019-09-28 LAB — BASIC METABOLIC PANEL
BUN: 13 mg/dL (ref 6–23)
CO2: 28 mEq/L (ref 19–32)
Calcium: 10.5 mg/dL (ref 8.4–10.5)
Chloride: 102 mEq/L (ref 96–112)
Creatinine, Ser: 0.98 mg/dL (ref 0.40–1.20)
GFR: 68.78 mL/min (ref >60.00)
Glucose, Bld: 99 mg/dL (ref 70–99)
Potassium: 4.5 mEq/L (ref 3.5–5.1)
Sodium: 138 mEq/L (ref 135–145)

## 2019-09-28 LAB — PTH, INTACT AND CALCIUM
Calcium: 10.3 mg/dL (ref 8.6–10.4)
PTH: 35 pg/mL (ref 14–64)

## 2019-09-28 LAB — VITAMIN D 25 HYDROXY (VIT D DEFICIENCY, FRACTURES): VITD: 51.84 ng/mL (ref 30.00–100.00)

## 2019-09-28 LAB — TSH: TSH: 1.51 u[IU]/mL (ref 0.35–4.50)

## 2019-09-28 LAB — T4, FREE: Free T4: 0.82 ng/dL (ref 0.60–1.60)

## 2019-09-29 ENCOUNTER — Encounter: Payer: Self-pay | Admitting: Gastroenterology

## 2019-10-04 ENCOUNTER — Other Ambulatory Visit: Payer: Medicare Other

## 2019-10-10 ENCOUNTER — Telehealth: Payer: Self-pay | Admitting: Endocrinology

## 2019-10-10 ENCOUNTER — Telehealth: Payer: Self-pay | Admitting: Gastroenterology

## 2019-10-10 NOTE — Telephone Encounter (Signed)
Patient requests to be called at ph# [phone] re: scheduling a reclast

## 2019-10-10 NOTE — Telephone Encounter (Signed)
Physicians order form must be filled out and faxed to [facility]. Proper forms are not on my person at this time due to being at a different location today. Thursday, upon my return, the form will be filled out and faxed to short stay infusion, and the pt will be contacted and given the number to schedule this at her convenience.

## 2019-10-11 NOTE — Telephone Encounter (Signed)
Patient received the letter mailed on 09/29/19 today. No questions.

## 2019-10-13 NOTE — Telephone Encounter (Signed)
Called pt and left detailed voicemail informing her that order form has been faxed, and short stay should receive it within the hour. Advised pt that she should wait a few hours before contacting [facility] and gave her the phone number to schedule herself. Requested a call back if she should have any additional questions.

## 2019-10-14 ENCOUNTER — Other Ambulatory Visit: Payer: Self-pay | Admitting: Cardiovascular Disease

## 2019-10-25 ENCOUNTER — Ambulatory Visit (HOSPITAL_COMMUNITY): Payer: Medicare Other

## 2019-10-26 ENCOUNTER — Other Ambulatory Visit: Payer: Medicare Other

## 2019-11-22 ENCOUNTER — Ambulatory Visit
Admission: RE | Admit: 2019-11-22 | Discharge: 2019-11-22 | Disposition: A | Discharge status: Home / Self Care | Payer: Medicare Other | Source: Ambulatory Visit | Attending: Endocrinology | Admitting: Endocrinology

## 2019-11-22 DIAGNOSIS — E042 Nontoxic multinodular goiter: Secondary | ICD-10-CM

## 2019-12-20 NOTE — Progress Notes (Signed)
Office Visit Note  Patient: Vickie Pearson             Date of Birth: Dec 15, 1955           MRN: [id number]             PCP: Ladell Pier, MD Referring: Ladell Pier, MD Visit Date: 12/26/2019 Occupation: @GUAROCC @  Subjective:  Joint Pain (bilateral knee pain, right>left - would like to discuss cortisone injections)   History of Present Illness: Vickie Pearson is a 64 y.o. female with history of rheumatoid arthritis and osteoarthritis.  She returns today after her last visit in March 2021.  At that time methotrexate was initiated.  She was treated with Plaquenil and methotrexate by Dr. Earnest Conroy in the past and had no treatment for a while due to insurance issues.  She was a started on methotrexate 0.8 mL subcu weekly in March which she states she took it for couple of months and discontinued as she did not like the side effects.  She experienced fatigue and confusion.  She states she has been off methotrexate since May and has been feeling better.  She continues to have some discomfort in her neck, lower back and her knee joints.  Denies any joint swelling.  Activities of Daily Living:  Patient reports morning stiffness for 15 minutes.   Patient Reports nocturnal pain.  Difficulty dressing/grooming: Denies Difficulty climbing stairs: Denies Difficulty getting out of chair: Denies Difficulty using hands for taps, buttons, cutlery, and/or writing: Denies  Review of Systems  Constitutional: Positive for fatigue.  HENT: Negative for mouth sores, mouth dryness and nose dryness.   Eyes: Positive for visual disturbance and dryness. Negative for pain and itching.  Respiratory: Negative for cough, hemoptysis, shortness of breath and difficulty breathing.   Cardiovascular: Positive for chest pain and palpitations. Negative for swelling in legs/feet.  Gastrointestinal: Negative for abdominal pain, blood in stool, constipation and diarrhea.  Endocrine: Positive for increased  urination.  Genitourinary: Negative for painful urination.  Musculoskeletal: Positive for arthralgias, joint pain and morning stiffness. Negative for joint swelling, myalgias, muscle weakness, muscle tenderness and myalgias.  Skin: Negative for color change, rash and redness.  Allergic/Immunologic: Negative for susceptible to infections.  Neurological: Positive for dizziness and headaches. Negative for numbness, memory loss and weakness.  Hematological: Negative for swollen glands.  Psychiatric/Behavioral: Positive for sleep disturbance. Negative for confusion.    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   Hx of abnormal cervical Pap smear 07/24/2016   OA (osteoarthritis) of knee 07/24/2016    Past Medical History:  Diagnosis Date   Colon polyps    Coronary artery calcification of native artery 02/21/2019   Minimal calcification noted 11/2018 on coronary CTA   Costochondritis    DDD (degenerative disc disease), cervical    Degenerative arthritis of knee    Bilateral   Degenerative disc disease, cervical    Degenerative disc disease, cervical    Fibromyalgia    GERD (  gastroesophageal reflux disease)    Hyperlipidemia    Inappropriate sinus tachycardia 02/21/2019   Monoclonal gammopathy    Multiple thyroid nodules    Osteoporosis    Rheumatoid arthritis ([place])     Family History  Problem  Relation Age of Onset   Hypertension Mother    Colon polyps Mother    Stroke Mother    Arthritis Mother    Hypertension Father    Heart disease Father    Arthritis Father    Kidney disease Father    Hypertension Sister    Hyperlipidemia Sister    Hypertension Brother    Hyperlipidemia Brother    Esophageal cancer Neg Hx    Rectal cancer Neg Hx    Stomach cancer Neg Hx    Osteoporosis Neg Hx    Past Surgical History:  Procedure Laterality Date   COLONOSCOPY  2012   COLONOSCOPY  2015   POLYPECTOMY     Social History   Social History Narrative   Not on file   Immunization History  Administered Date(s) Administered   Influenza, High Dose Seasonal PF 11/10/2018   Influenza, Quadrivalent, Recombinant, Inj, Pf 11/15/2017   Influenza,inj,Quad PF,6+ Mos 11/09/2016   PFIZER SARS-COV-2 Vaccination 03/17/2019, 04/07/2019, 11/17/2019     Objective: Vital Signs: BP 137/77 (BP Location: Left Arm, Patient Position: Sitting, Cuff Size: Small)    Pulse 70    Ht 5\' 7"  (1.702 m)    Wt 154 lb (69.9 kg)    BMI 24.12 kg/m    Physical Exam Vitals and nursing note reviewed.  Constitutional:      Appearance: She is well-developed.  HENT:     Head: Normocephalic and atraumatic.  Eyes:     Conjunctiva/sclera: Conjunctivae normal.  Cardiovascular:     Rate and Rhythm: Normal rate and regular rhythm.     Heart sounds: Normal heart sounds.  Pulmonary:     Effort: Pulmonary effort is normal.     Breath sounds: Normal breath sounds.  Abdominal:     General: Bowel sounds are normal.     Palpations: Abdomen is soft.  Musculoskeletal:     Cervical back: Normal range of motion.  Lymphadenopathy:     Cervical: No cervical adenopathy.  Skin:    General: Skin is warm and dry.     Capillary Refill: Capillary refill takes less than 2 seconds.  Neurological:     Mental Status: She is alert and oriented to person, place, and time.  Psychiatric:        Behavior:  Behavior normal.      Musculoskeletal Exam: She had good range of motion of cervical and lumbar spine.  She had limited range of motion of bilateral wrist joints with some synovial thickening.  There was no synovitis over MCPs PIPs or DIPs.  She had good range of motion of bilateral hip joints.  She had limited extension of bilateral knee joints with warmth on palpation of bilateral knee joints.  She had no tenderness over ankles or MTPs.  She has synovial thickening over MTPs.  Overcrowding of toes was noted.  CDAI Exam: CDAI Score: 3.8  Patient Global: 4 mm; Provider Global: 4 mm Swollen: 3 ; Tender: 0  Joint Exam 12/26/2019      Right  Left  Wrist  Swollen   Swollen   Knee  Swollen         Investigation: No additional findings.  Imaging: No results found.  Recent Labs: Lab Results  Component Value  Date   WBC 5.8 02/15/2019   HGB 13.3 02/15/2019   PLT 278 02/15/2019   NA 138 09/27/2019   K 4.5 09/27/2019   CL 102 09/27/2019   CO2 28 09/27/2019   GLUCOSE 99 09/27/2019   BUN 13 09/27/2019   CREATININE 0.98 09/27/2019   BILITOT 0.4 06/20/2019   ALKPHOS 75 06/20/2019   AST 15 06/20/2019   ALT 6 06/20/2019   PROT 7.2 06/20/2019   ALBUMIN 4.3 06/20/2019   CALCIUM 10.3 09/27/2019   CALCIUM 10.5 09/27/2019   GFRAA >60 02/15/2019    Speciality Comments: No specialty comments available.  Procedures:  Large Joint Inj: bilateral knee on 12/26/2019 3:03 PM Indications: pain Details: 27 G 1.5 in needle, medial approach  Arthrogram: No  Medications (Right): 1 mL lidocaine 1 %; 40 mg triamcinolone acetonide 40 MG/ML Medications (Left): 1 mL lidocaine 1 %; 40 mg triamcinolone acetonide 40 MG/ML Outcome: tolerated well, no immediate complications Procedure, treatment alternatives, risks and benefits explained, specific risks discussed. Consent was given by the patient. Immediately prior to procedure a time out was called to verify the correct patient, procedure, equipment,  support staff and site/side marked as required. Patient was prepped and draped in the usual sterile fashion.     Allergies: Penicillins, Piroxicam, and Sulfa antibiotics   Assessment / Plan:     Visit Diagnoses: Rheumatoid arthritis of multiple sites with negative rheumatoid factor ([place]) - previously on PLQ and then was switched to methotrexate 0.8 mL subcu weekly.  She saw Dr. Earnest Conroy in 2020 due to insurance changes she started coming here.  Her first and only visit was in March 2021.  At that time she was given cortisone injection to her bilateral knee joints per her request.  She was a started on methotrexate.  She states she took methotrexate for couple of months and then discontinued it due to increased fatigue and confusion.  I detailed discussion with her regarding severe rheumatoid arthritis and risk of further damage to her joints.  Patient stated that she did not want any kind of immunosuppressive agents.  She does understand that her arthritis can get worse and it can involve other organs.  High risk medication use - Started MTX at [facility] on 05/09/19.  She discontinued after a few weeks.  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  Primary osteoarthritis of both knees - And severe end-stage rheumatoid arthritis of both knees.  She had limited extension of bilateral knee joints and normal discomfort.  I advised total knee replacement.  She states she will think about it.  She requested repeat cortisone injection to her knee joints.  Side effects of cortisone injections were discussed.  Per her request bilateral knee joints were injected with cortisone as described above.  She tolerated the procedure well.  Postprocedure instructions were given.  DDD (degenerative disc disease), cervical-she had good range of motion of her cervical spine without discomfort.  DDD (degenerative disc disease), lumbar-she had good range of motion of the lumbar spine.  Age-related osteoporosis  without current pathological fracture-patient states that she has been followed by Dr. Renato Shin and receives treatment for osteoporosis.  Vitamin D deficiency  Monoclonal gammopathy-I did not see any recent visit with her hematologist in the chart.  I advised her to make a follow-up appointment.  Multiple thyroid nodules  Mixed hyperlipidemia  Orders: Orders Placed This Encounter  Procedures   Large Joint Inj   No orders of the defined types were  placed in this encounter.   Follow-Up Instructions: Return in about 6 months (around 06/24/2020) for Rheumatoid arthritis, Osteoarthritis, iritis.   Bo Merino, MD  Note - This record has been created using Editor, commissioning.  Chart creation errors have been sought, but may not always  have been located. Such creation errors do not reflect on  the standard of medical care.

## 2019-12-26 ENCOUNTER — Ambulatory Visit (INDEPENDENT_AMBULATORY_CARE_PROVIDER_SITE_OTHER): Payer: Medicare Other | Admitting: Rheumatology

## 2019-12-26 ENCOUNTER — Other Ambulatory Visit: Payer: Self-pay

## 2019-12-26 ENCOUNTER — Encounter: Payer: Self-pay | Admitting: Rheumatology

## 2019-12-26 VITALS — BP 137/77 | HR 70 | Ht 67.0 in | Wt 154.0 lb

## 2019-12-26 DIAGNOSIS — E559 Vitamin D deficiency, unspecified: Secondary | ICD-10-CM

## 2019-12-26 DIAGNOSIS — M5030 Other cervical disc degeneration, unspecified cervical region: Secondary | ICD-10-CM

## 2019-12-26 DIAGNOSIS — Z79899 Other long term (current) drug therapy: Secondary | ICD-10-CM | POA: Diagnosis not present

## 2019-12-26 DIAGNOSIS — I2510 Atherosclerotic heart disease of native coronary artery without angina pectoris: Secondary | ICD-10-CM

## 2019-12-26 DIAGNOSIS — M170 Bilateral primary osteoarthritis of knee: Secondary | ICD-10-CM | POA: Diagnosis not present

## 2019-12-26 DIAGNOSIS — I2584 Coronary atherosclerosis due to calcified coronary lesion: Secondary | ICD-10-CM

## 2019-12-26 DIAGNOSIS — H2013 Chronic iridocyclitis, bilateral: Secondary | ICD-10-CM | POA: Diagnosis not present

## 2019-12-26 DIAGNOSIS — M0609 Rheumatoid arthritis without rheumatoid factor, multiple sites: Secondary | ICD-10-CM | POA: Diagnosis not present

## 2019-12-26 DIAGNOSIS — M7062 Trochanteric bursitis, left hip: Secondary | ICD-10-CM

## 2019-12-26 DIAGNOSIS — M5136 Other intervertebral disc degeneration, lumbar region: Secondary | ICD-10-CM

## 2019-12-26 DIAGNOSIS — E042 Nontoxic multinodular goiter: Secondary | ICD-10-CM

## 2019-12-26 DIAGNOSIS — M51369 Other intervertebral disc degeneration, lumbar region without mention of lumbar back pain or lower extremity pain: Secondary | ICD-10-CM

## 2019-12-26 DIAGNOSIS — M810 Age-related osteoporosis without current pathological fracture: Secondary | ICD-10-CM

## 2019-12-26 DIAGNOSIS — E782 Mixed hyperlipidemia: Secondary | ICD-10-CM

## 2019-12-26 DIAGNOSIS — D472 Monoclonal gammopathy: Secondary | ICD-10-CM

## 2019-12-26 MED ORDER — TRIAMCINOLONE ACETONIDE 40 MG/ML IJ SUSP
40.0000 mg | INTRAMUSCULAR | Status: AC | PRN
  Administered 2019-12-26: 40 mg via INTRA_ARTICULAR

## 2019-12-26 MED ORDER — LIDOCAINE HCL 1 % IJ SOLN
1.0000 mL | INTRAMUSCULAR | Status: AC | PRN
  Administered 2019-12-26: 1 mL

## 2020-01-26 ENCOUNTER — Telehealth: Payer: Self-pay | Admitting: Hematology and Oncology

## 2020-01-26 NOTE — Telephone Encounter (Signed)
R/s 02/15/20 appt to 02/21/20 per provider on call schedule. Called and left msg. Mailed printout

## 2020-02-10 HISTORY — PX: BREAST BIOPSY: SHX20

## 2020-02-15 ENCOUNTER — Ambulatory Visit: Payer: Medicare Other | Admitting: Hematology and Oncology

## 2020-02-15 ENCOUNTER — Other Ambulatory Visit: Payer: Medicare Other

## 2020-02-20 ENCOUNTER — Other Ambulatory Visit: Payer: Self-pay | Admitting: Hematology and Oncology

## 2020-02-20 ENCOUNTER — Telehealth: Payer: Self-pay

## 2020-02-20 DIAGNOSIS — D472 Monoclonal gammopathy: Secondary | ICD-10-CM

## 2020-02-20 NOTE — Telephone Encounter (Signed)
lmom pt to return our call so that we may help them with the concern regarding the msg they ha left with questions regardig [facility]. Instructed pt to call us back

## 2020-02-21 ENCOUNTER — Other Ambulatory Visit: Payer: Self-pay

## 2020-02-21 ENCOUNTER — Inpatient Hospital Stay: Payer: Medicare Other | Attending: Hematology and Oncology

## 2020-02-21 ENCOUNTER — Encounter: Payer: Self-pay | Admitting: Hematology and Oncology

## 2020-02-21 ENCOUNTER — Inpatient Hospital Stay (HOSPITAL_BASED_OUTPATIENT_CLINIC_OR_DEPARTMENT_OTHER): Payer: Medicare Other | Admitting: Hematology and Oncology

## 2020-02-21 VITALS — BP 143/78 | HR 68 | Temp 97.7°F | Resp 18 | Wt 151.6 lb

## 2020-02-21 DIAGNOSIS — Z87891 Personal history of nicotine dependence: Secondary | ICD-10-CM | POA: Insufficient documentation

## 2020-02-21 DIAGNOSIS — D472 Monoclonal gammopathy: Secondary | ICD-10-CM

## 2020-02-21 DIAGNOSIS — Z23 Encounter for immunization: Secondary | ICD-10-CM

## 2020-02-21 LAB — CBC WITH DIFFERENTIAL (CANCER CENTER ONLY)
Abs Immature Granulocytes: 0.01 10*3/uL (ref 0.00–0.07)
Basophils Absolute: 0 10*3/uL (ref 0.0–0.1)
Basophils Relative: 1 %
Eosinophils Absolute: 0.1 10*3/uL (ref 0.0–0.5)
Eosinophils Relative: 2 %
HCT: 39.4 % (ref 36.0–46.0)
Hemoglobin: 12.9 g/dL (ref 12.0–15.0)
Immature Granulocytes: 0 %
Lymphocytes Relative: 47 %
Lymphs Abs: 2.5 10*3/uL (ref 0.7–4.0)
MCH: 30.8 pg (ref 26.0–34.0)
MCHC: 32.7 g/dL (ref 30.0–36.0)
MCV: 94 fL (ref 80.0–100.0)
Monocytes Absolute: 0.4 10*3/uL (ref 0.1–1.0)
Monocytes Relative: 8 %
Neutro Abs: 2.3 10*3/uL (ref 1.7–7.7)
Neutrophils Relative %: 42 %
Platelet Count: 243 10*3/uL (ref 150–400)
RBC: 4.19 MIL/uL (ref 3.87–5.11)
RDW: 14.9 % (ref 11.5–15.5)
WBC Count: 5.4 10*3/uL (ref 4.0–10.5)
nRBC: 0 % (ref 0.0–0.2)

## 2020-02-21 LAB — CMP (CANCER CENTER ONLY)
ALT: 8 U/L (ref 0–44)
AST: 14 U/L — ABNORMAL LOW (ref 15–41)
Albumin: 3.6 g/dL (ref 3.5–5.0)
Alkaline Phosphatase: 66 U/L (ref 38–126)
Anion gap: 7 (ref 5–15)
BUN: 10 mg/dL (ref 8–23)
CO2: 27 mmol/L (ref 22–32)
Calcium: 9.7 mg/dL (ref 8.9–10.3)
Chloride: 107 mmol/L (ref 98–111)
Creatinine: 0.96 mg/dL (ref 0.44–1.00)
GFR, Estimated: >60 mL/min (ref >60)
Glucose, Bld: 81 mg/dL (ref 70–99)
Potassium: 3.8 mmol/L (ref 3.5–5.1)
Sodium: 141 mmol/L (ref 135–145)
Total Bilirubin: 0.4 mg/dL (ref 0.3–1.2)
Total Protein: 7.3 g/dL (ref 6.5–8.1)

## 2020-02-21 LAB — LACTATE DEHYDROGENASE: LDH: 191 U/L (ref 98–192)

## 2020-02-21 MED ORDER — INFLUENZA VAC A&B SA ADJ QUAD 0.5 ML IM PRSY
PREFILLED_SYRINGE | INTRAMUSCULAR | Status: AC
  Filled 2020-02-21: qty 0.5

## 2020-02-21 MED ORDER — INFLUENZA VAC A&B SA ADJ QUAD 0.5 ML IM PRSY
0.5000 mL | PREFILLED_SYRINGE | Freq: Once | INTRAMUSCULAR | Status: AC
  Administered 2020-02-21: 0.5 mL via INTRAMUSCULAR

## 2020-02-21 NOTE — Progress Notes (Signed)
[place] [phone]   [phone]  PROGRESS NOTE  Patient Care Team: Ladell Pier, MD as PCP - General (Internal Medicine)  Hematological/Oncological History # IgAkappa monoclonal protein of undetermined significance 1) 11/12/2016: established care with Dr. Lebron Conners at [facility]. M protein 0.2, kappa 10.1, lambda 9.6, ratio 1.05. Met survey 11/24/2016 showed no evidence of lytic lesions.  2) 11/10/2017: M protein 0.1, kappa 12.5, lambda 8.2, ratio 1.52.  3) 02/14/2019: establish care with Dr. Lorenso Courier   Interval History:  Vickie Pearson 65 y.o. female with medical history significant for IgA kappa MGUS presents for a follow up visit. The patient's last visit was on 02/15/2019. In the interim since the last visit has had no major changes in her health.  On exam today Vickie Pearson notes that she has been well since her last visit.  She notes that she does have some problems with her knee as well as some degenerative disc disease in her back.  She is not established with orthopedic care in order to look at her knee.  She notes that her energy levels are so-so, and that she does not sleep particular well.  She notes that because of her knees she is limited in what exercise she can do.  She otherwise denies having any fevers, chills, sweats, nausea, vomiting or diarrhea.  She has no new bone or back pain.  Full 10 point ROS is listed below.  MEDICAL HISTORY:  Past Medical History:  Diagnosis Date  . Colon polyps   . Coronary artery calcification of native artery 02/21/2019   Minimal calcification noted 11/2018 on coronary CTA  . Costochondritis   . DDD (degenerative disc disease), cervical   . Degenerative arthritis of knee    Bilateral  . Degenerative disc disease, cervical   . Degenerative disc disease, cervical   . Fibromyalgia   . GERD (gastroesophageal reflux disease)   . Hyperlipidemia   . Inappropriate sinus tachycardia 02/21/2019  .  Monoclonal gammopathy   . Multiple thyroid nodules   . Osteoporosis   . Rheumatoid arthritis ([place])     SURGICAL HISTORY: Past Surgical History:  Procedure Laterality Date  . COLONOSCOPY  2012  . COLONOSCOPY  2015  . POLYPECTOMY      SOCIAL HISTORY: Social History   Socioeconomic History  . Marital status: Widowed    Spouse name: Not on file  . Number of children: Not on file  . Years of education: Not on file  . Highest education level: Not on file  Occupational History  . Not on file  Tobacco Use  . Smoking status: Former Smoker    Packs/day: 1.00    Types: Cigarettes    Quit date: 07/24/1988    Years since quitting: 31.6  . Smokeless tobacco: Never Used  Vaping Use  . Vaping Use: Never used  Substance and Sexual Activity  . Alcohol use: Yes    Comment: occasionally  . Drug use: No  . Sexual activity: Not on file  Other Topics Concern  . Not on file  Social History Narrative  . Not on file   Social Determinants of Health   Financial Resource Strain: Not on file  Food Insecurity: Not on file  Transportation Needs: Not on file  Physical Activity: Not on file  Stress: Not on file  Social Connections: Not on file  Intimate Partner Violence: Not on file    FAMILY HISTORY: Family History  Problem Relation Age of Onset  .  Hypertension Mother   . Colon polyps Mother   . Stroke Mother   . Arthritis Mother   . Hypertension Father   . Heart disease Father   . Arthritis Father   . Kidney disease Father   . Hypertension Sister   . Hyperlipidemia Sister   . Hypertension Brother   . Hyperlipidemia Brother   . Esophageal cancer Neg Hx   . Rectal cancer Neg Hx   . Stomach cancer Neg Hx   . Osteoporosis Neg Hx     ALLERGIES:  is allergic to penicillins, piroxicam, and sulfa antibiotics.  MEDICATIONS:  Current Outpatient Medications  Medication Sig Dispense Refill  . aspirin EC 81 MG tablet Take 81 mg by mouth daily.    . cholecalciferol (VITAMIN D) 1000  units tablet Take 3,000 Units by mouth daily.    . cyclobenzaprine (FLEXERIL) 5 MG tablet Take 1 tablet (5 mg total) by mouth 2 (two) times daily as needed for muscle spasms. 30 tablet 1  . fluticasone (FLONASE) 50 MCG/ACT nasal spray Place 1 spray into both nostrils as needed for allergies or rhinitis.    . Lactobacillus (PROBIOTIC ACIDOPHILUS PO) Take 1 capsule by mouth daily.    . Magnesium Citrate 100 MG TABS Take by mouth daily as needed.     . metoprolol succinate (TOPROL-XL) 50 MG 24 hr tablet Take 50 mg by mouth daily. Take with or immediately following a meal.    . [place] 420 MG/3.5ML SOCT INJECT 1 APPLICATOR INTO THE SKIN EVERY 30 DAYS 4 mL 11  . vitamin B-12 (CYANOCOBALAMIN) 1000 MCG tablet Take 1 tablet by mouth daily.    Marland Kitchen zinc sulfate 220 (50 Zn) MG capsule Take by mouth daily.      No current facility-administered medications for this visit.    REVIEW OF SYSTEMS:   Constitutional: ( - ) fevers, ( - )  chills , ( - ) night sweats Eyes: ( - ) blurriness of vision, ( - ) double vision, ( - ) watery eyes Ears, nose, mouth, throat, and face: ( - ) mucositis, ( - ) sore throat Respiratory: ( - ) cough, ( - ) dyspnea, ( - ) wheezes Cardiovascular: ( - ) palpitation, ( - ) chest discomfort, ( - ) lower extremity swelling Gastrointestinal:  ( - ) nausea, ( - ) heartburn, ( - ) change in bowel habits Skin: ( - ) abnormal skin rashes Lymphatics: ( - ) new lymphadenopathy, ( - ) easy bruising Neurological: ( - ) numbness, ( - ) tingling, ( - ) new weaknesses Behavioral/Psych: ( - ) mood change, ( - ) new changes  All other systems were reviewed with the patient and are negative.  PHYSICAL EXAMINATION: ECOG PERFORMANCE STATUS: 1 - Symptomatic but completely ambulatory  Vitals:   02/21/20 1045  BP: (!) 143/78  Pulse: 68  Resp: 18  Temp: 97.7 F (36.5 C)  SpO2: 100%   Filed Weights   02/21/20 1045  Weight: 151 lb 9.6 oz (68.8 kg)    GENERAL: well  appearing elderly African American female alert, no distress and comfortable SKIN: skin color, texture, turgor are normal, no rashes or significant lesions EYES: conjunctiva are pink and non-injected, sclera clear LUNGS: clear to auscultation and percussion with normal breathing effort HEART: regular rate & rhythm and no murmurs and no lower extremity edema Musculoskeletal: no cyanosis of digits and no clubbing  PSYCH: alert & oriented x 3, fluent speech NEURO: no focal motor/sensory deficits  LABORATORY DATA:  I have reviewed the data as listed CBC Latest Ref Rng & Units 02/21/2020 02/15/2019 11/24/2018  WBC 4.0 - 10.5 K/uL 5.4 5.8 4.5  Hemoglobin 12.0 - 15.0 g/dL 12.9 13.3 13.0  Hematocrit 36.0 - 46.0 % 39.4 40.9 40.9  Platelets 150 - 400 K/uL 243 278 269    CMP Latest Ref Rng & Units 02/21/2020 09/27/2019 09/27/2019  Glucose 70 - 99 mg/dL 81 - 99  BUN 8 - 23 mg/dL 10 - 13  Creatinine 0.44 - 1.00 mg/dL 0.96 - 0.98  Sodium 135 - 145 mmol/L 141 - 138  Potassium 3.5 - 5.1 mmol/L 3.8 - 4.5  Chloride 98 - 111 mmol/L 107 - 102  CO2 22 - 32 mmol/L 27 - 28  Calcium 8.9 - 10.3 mg/dL 9.7 10.3 10.5  Total Protein 6.5 - 8.1 g/dL 7.3 - -  Total Bilirubin 0.3 - 1.2 mg/dL 0.4 - -  Alkaline Phos 38 - 126 U/L 66 - -  AST 15 - 41 U/L 14(L) - -  ALT 0 - 44 U/L 8 - -    Lab Results  Component Value Date   MPROTEIN 0.2 (H) 02/15/2019   MPROTEIN Not Observed 05/12/2017   MPROTEIN 0.2 (H) 02/11/2017   Lab Results  Component Value Date   KPAFRELGTCHN 17.5 02/15/2019   KPAFRELGTCHN 12.5 11/10/2017   KPAFRELGTCHN 13.6 05/12/2017   LAMBDASER 12.0 02/15/2019   LAMBDASER 8.2 11/10/2017   LAMBDASER 9.8 05/12/2017   KAPLAMBRATIO >18.86 02/23/2019   KAPLAMBRATIO 1.46 02/15/2019   KAPLAMBRATIO 1.52 11/10/2017    RADIOGRAPHIC STUDIES: No results found.  ASSESSMENT & PLAN Vickie Pearson 65 y.o. female with medical history significant for IgAkappa MGUS  presents for a follow up visit.     After review of the labs and discussion with the patient, her finding is most consistent with monoclonal gammopathy of undetermined significance.  It appears at this has been very stable over the last 2 years, with a very low M protein.  Given these findings I think would be reasonable to continue to observe on a yearly basis with SPEP, UPEP, and serum free light chains.  Technically the patient meets the criteria for a bone marrow biopsy with an IgA MGUS, however given the extremely low levels of M protein and the lack of any anemia or increased calcium I think that observation would be very appropriate at this time.  # IgAkappa monoclonal protein of undetermined significance --today will order SPEP, UPEP, SFLC. --yearly metastatic survey is due (last in Jan 2021) --M protein is only mildly elevated and Hgb/Ca/Cr are WNL. As such I recommend holding on a bone marrow biopsy at this time. --RTC in 1 years time for repeat serologies and metastatic survey.   No orders of the defined types were placed in this encounter.   All questions were answered. The patient knows to call the clinic with any problems, questions or concerns.  A total of more than 30 minutes were spent on this encounter and over half of that time was spent on counseling and coordination of care as outlined above.   Ledell Peoples, MD Department of Hematology/Oncology [place] at [facility] Phone: [phone] Pager: [phone] Email: [email or web address]  02/21/2020 5:53 PM

## 2020-02-22 LAB — KAPPA/LAMBDA LIGHT CHAINS
Kappa free light chain: 16.5 mg/L (ref 3.3–19.4)
Kappa, lambda light chain ratio: 1.63 (ref 0.26–1.65)
Lambda free light chains: 10.1 mg/L (ref 5.7–26.3)

## 2020-02-22 LAB — BETA 2 MICROGLOBULIN, SERUM: Beta-2 Microglobulin: 1.4 mg/L (ref 0.6–2.4)

## 2020-02-23 LAB — MULTIPLE MYELOMA PANEL, SERUM
Albumin SerPl Elph-Mcnc: 3.7 g/dL (ref 2.9–4.4)
Albumin/Glob SerPl: 1.2 (ref 0.7–1.7)
Alpha 1: 0.2 g/dL (ref 0.0–0.4)
Alpha2 Glob SerPl Elph-Mcnc: 0.7 g/dL (ref 0.4–1.0)
B-Globulin SerPl Elph-Mcnc: 1 g/dL (ref 0.7–1.3)
Gamma Glob SerPl Elph-Mcnc: 1.2 g/dL (ref 0.4–1.8)
Globulin, Total: 3.2 g/dL (ref 2.2–3.9)
IgA: 290 mg/dL (ref 87–352)
IgG (Immunoglobin G), Serum: 1221 mg/dL (ref 586–1602)
IgM (Immunoglobulin M), Srm: 45 mg/dL (ref 26–217)
Total Protein ELP: 6.9 g/dL (ref 6.0–8.5)

## 2020-02-28 ENCOUNTER — Telehealth: Payer: Self-pay | Admitting: *Deleted

## 2020-02-28 ENCOUNTER — Other Ambulatory Visit: Payer: Self-pay | Admitting: Cardiovascular Disease

## 2020-02-28 ENCOUNTER — Telehealth: Payer: Self-pay | Admitting: Cardiovascular Disease

## 2020-02-28 NOTE — Telephone Encounter (Signed)
*  STAT* If patient is at the pharmacy, call can be transferred to refill team.   1. Which medications need to be refilled? (please list name of each medication and dose if known) metoprolol succinate (TOPROL-XL) 50 MG 24 hr tablet  2. Which pharmacy/location (including street and city if local pharmacy) is medication to be sent to? [facility], [address]  3. Do they need a 30 day or 90 day supply? [place]

## 2020-02-28 NOTE — Telephone Encounter (Signed)
-----   Message from Orson Slick, MD sent at 02/28/2020 11:50 AM EST ----- Please let Vickie Pearson know that her MGUS labs are stable. We will see her back in 1 year.  ----- Message ----- From: Interface, Lab In [place] Sent: 02/21/2020  10:43 AM EST To: Orson Slick, MD

## 2020-02-28 NOTE — Telephone Encounter (Signed)
TCT patient regarding recent lab results.  No answer but was able to leave vm message on identified phone #. Advised that lab results are stable and that we will see her back in clinic in 1 year. Advised that she could call back with any questions or concerns @ [phone]

## 2020-03-01 ENCOUNTER — Telehealth: Payer: Self-pay | Admitting: Internal Medicine

## 2020-03-01 DIAGNOSIS — M62838 Other muscle spasm: Secondary | ICD-10-CM

## 2020-03-01 NOTE — Telephone Encounter (Signed)
Medication Refill - Medication: cyclobenzaprine (FLEXERIL) 5 MG tablet   Has the patient contacted their pharmacy? No. (Agent: If no, request that the patient contact the pharmacy for the refill.) (Agent: If yes, when and what did the pharmacy advise?)  Preferred Pharmacy (with phone number or street name):  [facility]  [place], [place] [address]  Phone:  [phone] Fax:  [phone]  DEA #:  --   Agent: Please be advised that RX refills may take up to 3 business days. We ask that you follow-up with your pharmacy.

## 2020-03-01 NOTE — Telephone Encounter (Signed)
Requested medication (s) are due for refill today: no  Requested medication (s) are on the active medication list: yes  Last refill:  09/22/2019  Future visit scheduled: no  Notes to clinic:  this refill cannot be delegated    Requested Prescriptions  Pending Prescriptions Disp Refills   cyclobenzaprine (FLEXERIL) 5 MG tablet 30 tablet 1    Sig: Take 1 tablet (5 mg total) by mouth 2 (two) times daily as needed for muscle spasms.      There is no refill protocol information for this order

## 2020-04-02 ENCOUNTER — Telehealth: Payer: Self-pay | Admitting: Cardiovascular Disease

## 2020-04-02 NOTE — Telephone Encounter (Signed)
Spoke with patient. She got her fitbit a week ago and has been monitoring her pulse. According to fitbit resting HR is 57. This morning her HR dropped to 49 for a minute and then came back up. It's been 55-60 so far today. Blood pressure reading this morning was 136/85, 58. No chest pain, no shortness of breath, no dizziness or lightheadedness. She reports she is fatigued and has been for awhile now. She also mentioned she does not always sleep great. Advised patient to spot check her pulse TID for the next week and call back with readings. Also advised message will be sent to Dr. Oval Linsey as well.

## 2020-04-02 NOTE — Telephone Encounter (Signed)
STAT if HR is under 50 or over 120 (normal HR is 60-100 beats per minute)  1) What is your heart rate? 49  2) Do you have a log of your heart rate readings (document readings)? 59,57  3) Do you have any other symptoms? Fatigue  Patient said she got a Fitbit and has worn it for about 2 weeks. She noticed that her HR was low this morning. She wanted to know if she needed to cut down on her medication or not. Please advise

## 2020-04-04 NOTE — Telephone Encounter (Signed)
Try reducing metoprolol to 25 mg.  Unless she feels poorly these numbers are not really dangerous but the low heart rate could be making her more tired.

## 2020-04-08 MED ORDER — METOPROLOL SUCCINATE ER 25 MG PO TB24: 25.0000 mg | ORAL_TABLET | Freq: Every day | ORAL | 2 refills | Status: DC

## 2020-04-08 NOTE — Telephone Encounter (Signed)
Spoke with patient, reviewed recommendation from Dr. Oval Linsey. Patient requested new prescription so pill would not have to be cut in half. New script for Toprol XL 25mg  daily sent to preferred pharmacy. Patient verbalized understanding.

## 2020-06-19 NOTE — Progress Notes (Signed)
Office Visit Note  Patient: Vickie Pearson             Date of Birth: Apr 20, 1955           MRN: [id number]             PCP: Ladell Pier, MD Referring: Ladell Pier, MD Visit Date: 07/02/2020 Occupation: @GUAROCC @  Subjective:  Pain in both knees   History of Present Illness: Vickie Pearson is a 65 y.o. female with history of seronegative rheumatoid arthritis, iritis, and and osteoarthritis.  She is not currently taking any immunosuppressive agents for management of rheumatoid arthritis or arteritis.  According to the patient she continues to have flares every 4 to 5 months involving multiple joints.  She presents today with discomfort in the left shoulder, both knee joints, and both hands.  She has been taking Tylenol on a daily basis which does not alleviate her discomfort.  According to the patient she underwent Visco gel injections in both knees in March 2022 at [facility] but did not notice any improvement in her knee joint pain.  She states that both knees buckle at times but she has not had any recent falls or injuries.  She requested a right knee joint cortisone injection today.  Patient ports he is also been experiencing right-sided sciatica for the past 2 weeks.  She denies any changes in activities or recent injuries prior to the onset of symptoms.  She states that her discomfort has gradually been improving since using a TENS unit.  She denies any iritis flares.  She is not experiencing any eye pain, photophobia, or eye redness at this time. She denies any shortness of breath.       Activities of Daily Living:  Patient reports morning stiffness for 15-20 minutes.   Patient Reports nocturnal pain.  Difficulty dressing/grooming: Denies Difficulty climbing stairs: Reports Difficulty getting out of chair: Reports Difficulty using hands for taps, buttons, cutlery, and/or writing: Denies  Review of Systems  Constitutional: Positive for fatigue.   HENT: Negative for mouth sores, mouth dryness and nose dryness.   Eyes: Positive for dryness. Negative for pain and itching.  Respiratory: Negative for shortness of breath and difficulty breathing.   Cardiovascular: Negative for chest pain and palpitations.  Gastrointestinal: Negative for blood in stool, constipation and diarrhea.  Endocrine: Negative for increased urination.  Genitourinary: Negative for difficulty urinating.  Musculoskeletal: Positive for arthralgias, joint pain, myalgias, morning stiffness, muscle tenderness and myalgias. Negative for joint swelling.  Skin: Negative for color change, rash and redness.  Allergic/Immunologic: Negative for susceptible to infections.  Neurological: Positive for dizziness, numbness, headaches and weakness. Negative for memory loss.  Hematological: Positive for bruising/bleeding tendency.  Psychiatric/Behavioral: Negative for confusion.    PMFS History:  Patient Active Problem List   Diagnosis Date Noted  . Immunosuppression ([place]) 04/21/2019  . Coronary artery calcification of native artery 02/21/2019  . Inappropriate sinus tachycardia 02/21/2019  . Other chest pain 03/01/2017  . Rheumatoid arthritis involving multiple sites ([place]) 07/24/2016  . Cervical radiculopathy 07/24/2016  . Familial hyperlipidemia 07/24/2016  . Osteoporosis 07/24/2016  . Vitamin D deficiency 07/24/2016  . Multiple thyroid nodules 07/24/2016  . MGUS (monoclonal gammopathy of unknown significance) 07/24/2016  . Colon polyps 07/24/2016  . 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 Hx of abnormal cervical Pap smear 07/24/2016  . OA (osteoarthritis) of knee 07/24/2016    Past Medical History:  Diagnosis Date  . Colon polyps   .  Coronary artery calcification of native artery 02/21/2019   Minimal calcification noted 11/2018 on coronary CTA  . Costochondritis   . DDD (degenerative disc disease), cervical   . Degenerative arthritis of knee    Bilateral  .  Degenerative disc disease, cervical   . Degenerative disc disease, cervical   . Fibromyalgia   . GERD (gastroesophageal reflux disease)   . Hyperlipidemia   . Inappropriate sinus tachycardia 02/21/2019  . Monoclonal gammopathy   . Multiple thyroid nodules   . Osteoporosis   . Rheumatoid arthritis ([place])     Family History  Problem Relation Age of Onset  . Hypertension Mother   . Colon polyps Mother   . Stroke Mother   . Arthritis Mother   . Hypertension Father   . Heart disease Father   . Arthritis Father   . Kidney disease Father   . Hypertension Sister   . Hyperlipidemia Sister   . Hypertension Brother   . Hyperlipidemia Brother   . Esophageal cancer Neg Hx   . Rectal cancer Neg Hx   . Stomach cancer Neg Hx   . Osteoporosis Neg Hx    Past Surgical History:  Procedure Laterality Date  . COLONOSCOPY  2012  . COLONOSCOPY  2015  . POLYPECTOMY     Social History   Social History Narrative  . Not on file   Immunization History  Administered Date(s) Administered  . Fluad Quad(high Dose 65+) 02/21/2020  . Influenza, High Dose Seasonal PF 11/10/2018  . Influenza, Quadrivalent, Recombinant, Inj, Pf 11/15/2017  . Influenza,inj,Quad PF,6+ Mos 11/09/2016, 11/10/2018  . PFIZER(Purple Top)SARS-COV-2 Vaccination 03/17/2019, 04/07/2019, 11/17/2019     Objective: Vital Signs: BP 132/83 (BP Location: Left Arm, Patient Position: Sitting, Cuff Size: Normal)   Pulse 65   Resp 13   Ht 5\' 7"  (1.702 m)   Wt 152 lb (68.9 kg)   BMI 23.81 kg/m    Physical Exam Vitals and nursing note reviewed.  Constitutional:      Appearance: She is well-developed.  HENT:     Head: Normocephalic and atraumatic.  Eyes:     Conjunctiva/sclera: Conjunctivae normal.  Pulmonary:     Effort: Pulmonary effort is normal.  Abdominal:     Palpations: Abdomen is soft.  Musculoskeletal:     Cervical back: Normal range of motion.  Skin:    General: Skin is warm and dry.     Capillary Refill:  Capillary refill takes less than 2 seconds.  Neurological:     Mental Status: She is alert and oriented to person, place, and time.  Psychiatric:        Behavior: Behavior normal.      Musculoskeletal Exam: C-spine, thoracic spine, and lumbar spine good ROM.  Shoulder joints have good ROM with some discomfort in the left shoulder.  Elbow joints good ROM with no tenderness or inflammation.  Tenderness and mild synovitis over the right wrist joint.  Mild synovial thickening over both wrist joints. No tenderness or synovitis over MCP joints.  Hip joints good ROM with no discomfort.  Warmth and synovitis of the right knee noted.  Limited extension of the right knee joint.  Ankle joints good ROM.  Tenderness over the right ankle joint.   CDAI Exam: CDAI Score: 8  Patient Global: 5 mm; Provider Global: 5 mm Swollen: 2 ; Tender: 6  Joint Exam 07/02/2020      Right  Left  Glenohumeral      Tender  Wrist  Swollen Tender  Tender  Knee  Swollen Tender   Tender  Ankle   Tender        Investigation: No additional findings.  Imaging: No results found.  Recent Labs: Lab Results  Component Value Date   WBC 5.4 02/21/2020   HGB 12.9 02/21/2020   PLT 243 02/21/2020   NA 141 02/21/2020   K 3.8 02/21/2020   CL 107 02/21/2020   CO2 27 02/21/2020   GLUCOSE 81 02/21/2020   BUN 10 02/21/2020   CREATININE 0.96 02/21/2020   BILITOT 0.4 02/21/2020   ALKPHOS 66 02/21/2020   AST 14 (L) 02/21/2020   ALT 8 02/21/2020   PROT 7.3 02/21/2020   ALBUMIN 3.6 02/21/2020   CALCIUM 9.7 02/21/2020   GFRAA >60 02/15/2019    Speciality Comments: No specialty comments available.  Procedures:  Large Joint Inj: R knee on 07/02/2020 12:15 PM Indications: pain Details: 27 G 1.5 in needle, medial approach  Arthrogram: No  Medications: 1.5 mL lidocaine 1 %; 40 mg triamcinolone acetonide 40 MG/ML Aspirate: 0 mL Outcome: tolerated well, no immediate complications Procedure, treatment alternatives, risks  and benefits explained, specific risks discussed. Consent was given by the patient. Immediately prior to procedure a time out was called to verify the correct patient, procedure, equipment, support staff and site/side marked as required. Patient was prepped and draped in the usual sterile fashion.     Allergies: Penicillins, Piroxicam, and Sulfa antibiotics   Assessment / Plan:     Visit Diagnoses: Rheumatoid arthritis of multiple sites with negative rheumatoid factor ([place]) - She presents today with pain and stiffness in both knee joints.  She has limited extension and synovitis of the right knee on exam.  Tenderness and mild synovitis over the right wrist was also noted.  She has not currently taking any immunosuppressive agents.  She was previously on Plaquenil which was then switched to methotrexate.  She took methotrexate for several months but discontinued due to experiencing brain fog and confusion.  She has been apprehensive to take any immunosuppressive agents despite the potential for disease progression.  She declined updated x-rays at this time.  Different treatment options were discussed today in detail.  Indications, contraindications, potential side effects of Arava were discussed.  All questions were addressed and consent was obtained.  She would like to hold off on starting [place] until after her vacation in June 2022.  We discussed starting on the low-dose of Arava 10 mg by mouth daily and if tolerated and labs are stable in 2 weeks she will increase to 20 mg daily.  Prescription for Jolee Ewing is pending lab results today. The right knee joint was injected with cortisone.  She declined a prednisone taper at this time.  She will follow-up in the office in 2 to 3 months after starting on [place] to assess her response.  Medication counseling:  Baseline Immunosuppressant Therapy Labs Hepatitis Latest Ref Rng & Units 10/02/2016  Hep B Surface Ag NON-REACTIVE NON-REACTIVE  Hep B IgM NON-REACTIVE  NON REACTIVE  Hep C Ab NON-REACTIVE NON-REACTIVE  Hep A IgM NON-REACTIVE 0.00    Lab Results  Component Value Date   HIV NONREACTIVE 10/02/2016    Immunoglobulin Electrophoresis Latest Ref Rng & Units 02/21/2020  IgA  81 - 463 mg/dL -  IgG 586 - 1,602 mg/dL 1,221  IgM 26 - 217 mg/dL 45    Serum Protein Electrophoresis Latest Ref Rng & Units 02/21/2020  Total Protein 6.5 - 8.1 g/dL 7.3  Albumin 2.9 -  4.4 g/dL -  Alpha-1 0.0 - 0.4 g/dL -  Alpha-2 0.4 - 1.0 g/dL -  Beta Globulin 0.7 - 1.3 g/dL -  Gamma Globulin 0.4 - 1.8 g/dL -   Patient was counseled on the purpose, proper use, and adverse effects of leflunomide including risk of infection, nausea/diarrhea/weight loss, increase in blood pressure, rash, hair loss, tingling in the hands and feet, and signs and symptoms of interstitial lung disease.   Also counseled on Black Box warning of liver injury and importance of avoiding alcohol while on therapy. Discussed that there is the possibility of an increased risk of malignancy but it is not well understood if this increased risk is due to the medication or the disease state.  Counseled patient to avoid live vaccines. Recommend annual influenza, Pneumovax 23, Prevnar 13, and Shingrix as indicated.   Discussed the importance of frequent monitoring of liver function and blood count.  Standing orders placed.  Discussed importance of birth control while on leflunomide due to risk of congenital abnormalities, and patient confirms she is postmenopausal.  Provided patient with educational materials on leflunomide and answered all questions.  Patient consented to [place] use, and consent will be uploaded into the media tab.   Patient dose will be 10 mg daily x2 weeks and if labs are stable she will increase to 20 mg daily.  Prescription pending lab results and/or insurance approval.  High risk medication use - The plan is to start taking Arava 10 mg by mouth daily and if labs are stable in 2 weeks she  will increase to 20 mg daily.  She will require updated lab work prior to starting on [place].  Orders for CBC and CMP were released today.  Her next lab work will be due in 2 weeks x 2, 2 months, then every 3 months.  Standing orders for CBC and CMP remain in place.  She has had all other baseline immunosuppressive lab work in the past.  She previously tried taking Plaquenil as well as methotrexate.  She discontinued methotrexate due to experiencing brain fog and confusion.  She has been apprehensive to take any immunosuppressive agents up until this point.  She would like to hold off on starting [place] until after her vacation in June 2022.- Plan: CBC with Differential/Platelet, COMPLETE METABOLIC PANEL WITH GFR  Chronic iritis of both eyes: She has not had any recent iritis flares.  She has not been experiencing any eye pain, photophobia, or eye redness.  Primary osteoarthritis of both knees - Severe end-stage rheumatoid arthritis of both knees.  She continues to have chronic pain in both knee joints.  She has not had any recent falls or injuries.  She has an occasional buckling sensation in both knees.  She underwent visco gel injections in both knees at [facility] in March 2022 but did not notice any improvement in her knee joint discomfort.  She had updated x-rays of both knees at that time.  She requested a right knee joint cortisone injection today.  She tolerated the procedure well.  Procedure note was completed above.  Aftercare was discussed. Paperwork for a permanent handicap placard was provided for the patient today.  DDD (degenerative disc disease), cervical: She has good ROM with no discomfort.   DDD (degenerative disc disease), lumbar: She has some midline spinal tenderness in the lumbar region.  She is currently experiencing right sided sciatica, which started 2 weeks ago.  She did not have any injury prior to the onset of  symptoms.  She has started to notice gradual improvement in  her symptoms since using a TENS unit.  She has an upcoming appointment at [facility].   Age-related osteoporosis without current pathological fracture - DEXA updated on 07/07/19: The BMD measured at Femur Neck Left is 0.624 g/cm2 with a T-score of -3.0.  She is taking vitamin D 3,000 units daily.  No recent falls or fractures.   Vitamin D deficiency: She is taking vitamin D 3,000 units daily.   Other medical conditions are listed as follows:   Monoclonal gammopathy: Followed by Dr. Lorenso Courier.   Mixed hyperlipidemia  Multiple thyroid nodules  Orders: Orders Placed This Encounter  Procedures  . Large Joint Inj: R knee  . CBC with Differential/Platelet  . COMPLETE METABOLIC PANEL WITH GFR   No orders of the defined types were placed in this encounter.     Follow-Up Instructions: Return in about 5 months (around 12/02/2020) for Rheumatoid arthritis, iritis, Osteoarthritis.   Ofilia Neas, PA-C  Note - This record has been created using Dragon software.  Chart creation errors have been sought, but may not always  have been located. Such creation errors do not reflect on  the standard of medical care.

## 2020-06-26 ENCOUNTER — Ambulatory Visit: Payer: Medicare Other | Admitting: Physician Assistant

## 2020-07-02 ENCOUNTER — Other Ambulatory Visit: Payer: Self-pay

## 2020-07-02 ENCOUNTER — Encounter: Payer: Self-pay | Admitting: Physician Assistant

## 2020-07-02 ENCOUNTER — Ambulatory Visit (INDEPENDENT_AMBULATORY_CARE_PROVIDER_SITE_OTHER): Payer: Medicare Other | Admitting: Physician Assistant

## 2020-07-02 VITALS — BP 132/83 | HR 65 | Resp 13 | Ht 67.0 in | Wt 152.0 lb

## 2020-07-02 DIAGNOSIS — M5030 Other cervical disc degeneration, unspecified cervical region: Secondary | ICD-10-CM

## 2020-07-02 DIAGNOSIS — H2013 Chronic iridocyclitis, bilateral: Secondary | ICD-10-CM | POA: Diagnosis not present

## 2020-07-02 DIAGNOSIS — M25561 Pain in right knee: Secondary | ICD-10-CM | POA: Diagnosis not present

## 2020-07-02 DIAGNOSIS — E559 Vitamin D deficiency, unspecified: Secondary | ICD-10-CM

## 2020-07-02 DIAGNOSIS — Z79899 Other long term (current) drug therapy: Secondary | ICD-10-CM | POA: Diagnosis not present

## 2020-07-02 DIAGNOSIS — M170 Bilateral primary osteoarthritis of knee: Secondary | ICD-10-CM

## 2020-07-02 DIAGNOSIS — G8929 Other chronic pain: Secondary | ICD-10-CM | POA: Diagnosis not present

## 2020-07-02 DIAGNOSIS — E782 Mixed hyperlipidemia: Secondary | ICD-10-CM

## 2020-07-02 DIAGNOSIS — M0609 Rheumatoid arthritis without rheumatoid factor, multiple sites: Secondary | ICD-10-CM | POA: Diagnosis not present

## 2020-07-02 DIAGNOSIS — M5136 Other intervertebral disc degeneration, lumbar region: Secondary | ICD-10-CM

## 2020-07-02 DIAGNOSIS — E042 Nontoxic multinodular goiter: Secondary | ICD-10-CM

## 2020-07-02 DIAGNOSIS — M51369 Other intervertebral disc degeneration, lumbar region without mention of lumbar back pain or lower extremity pain: Secondary | ICD-10-CM

## 2020-07-02 DIAGNOSIS — M810 Age-related osteoporosis without current pathological fracture: Secondary | ICD-10-CM

## 2020-07-02 DIAGNOSIS — D472 Monoclonal gammopathy: Secondary | ICD-10-CM

## 2020-07-02 MED ORDER — TRIAMCINOLONE ACETONIDE 40 MG/ML IJ SUSP
40.0000 mg | INTRAMUSCULAR | Status: AC | PRN
  Administered 2020-07-02: 40 mg via INTRA_ARTICULAR

## 2020-07-02 MED ORDER — LIDOCAINE HCL 1 % IJ SOLN
1.5000 mL | INTRAMUSCULAR | Status: AC | PRN
  Administered 2020-07-02: 1.5 mL

## 2020-07-02 NOTE — Progress Notes (Signed)
Pharmacy Note  Subjective: Patient presents today to the [facility] for follow up office visit.  Patient seen by the pharmacist for counseling on leflunomide Jolee Ewing) for rheumatoid arthritis. She previously tried hydroxychloroquine and methotrexate. MTX was stopped d/t brain fog and confusion. She remains apprehensive to start new medication - she has a vacation this upcoming month.  Objective: CBC    Component Value Date/Time   WBC 5.4 02/21/2020 1019   WBC 4.5 11/24/2018 0724   RBC 4.19 02/21/2020 1019   HGB 12.9 02/21/2020 1019   HGB 12.7 02/11/2017 1035   HCT 39.4 02/21/2020 1019   HCT 39.6 02/11/2017 1035   PLT 243 02/21/2020 1019   PLT 239 02/11/2017 1035   PLT 256 07/24/2016 1654   MCV 94.0 02/21/2020 1019   MCV 99.7 02/11/2017 1035   MCH 30.8 02/21/2020 1019   MCHC 32.7 02/21/2020 1019   RDW 14.9 02/21/2020 1019   RDW 14.9 (H) 02/11/2017 1035   LYMPHSABS 2.5 02/21/2020 1019   LYMPHSABS 2.0 02/11/2017 1035   MONOABS 0.4 02/21/2020 1019   MONOABS 0.3 02/11/2017 1035   EOSABS 0.1 02/21/2020 1019   EOSABS 0.1 02/11/2017 1035   BASOSABS 0.0 02/21/2020 1019   BASOSABS 0.0 02/11/2017 1035    CMP     Component Value Date/Time   NA 141 02/21/2020 1019   NA 142 02/06/2019 1128   NA 141 02/11/2017 1035   K 3.8 02/21/2020 1019   K 3.9 02/11/2017 1035   CL 107 02/21/2020 1019   CO2 27 02/21/2020 1019   CO2 25 02/11/2017 1035   GLUCOSE 81 02/21/2020 1019   GLUCOSE 85 02/11/2017 1035   BUN 10 02/21/2020 1019   BUN 10 02/06/2019 1128   BUN 10.4 02/11/2017 1035   CREATININE 0.96 02/21/2020 1019   CREATININE 0.79 09/29/2017 1058   CREATININE 0.8 02/11/2017 1035   CALCIUM 9.7 02/21/2020 1019   CALCIUM 9.3 02/11/2017 1035   PROT 7.3 02/21/2020 1019   PROT 7.2 06/20/2019 0952   PROT 7.0 02/11/2017 1035   ALBUMIN 3.6 02/21/2020 1019   ALBUMIN 4.3 06/20/2019 0952   ALBUMIN 3.7 02/11/2017 1035   AST 14 (L) 02/21/2020 1019   AST 14 02/11/2017 1035   ALT 8  02/21/2020 1019   ALT 12 02/11/2017 1035   ALKPHOS 66 02/21/2020 1019   ALKPHOS 63 02/11/2017 1035   BILITOT 0.4 02/21/2020 1019   BILITOT 0.37 02/11/2017 1035   GFRNONAA >60 02/21/2020 1019   GFRNONAA 80 09/29/2017 1058   GFRAA >60 02/15/2019 1053   GFRAA 92 09/29/2017 1058    Baseline Immunosuppressant Therapy Labs    Hepatitis Latest Ref Rng & Units 10/02/2016  Hep B Surface Ag NON-REACTIVE NON-REACTIVE  Hep B IgM NON-REACTIVE NON REACTIVE  Hep C Ab NON-REACTIVE NON-REACTIVE  Hep A IgM NON-REACTIVE 0.00    Lab Results  Component Value Date   HIV NONREACTIVE 10/02/2016    Immunoglobulin Electrophoresis Latest Ref Rng & Units 02/21/2020  IgA  81 - 463 mg/dL -  IgG 586 - 1,602 mg/dL 1,221  IgM 26 - 217 mg/dL 45    Serum Protein Electrophoresis Latest Ref Rng & Units 02/21/2020  Total Protein 6.5 - 8.1 g/dL 7.3  Albumin 2.9 - 4.4 g/dL -  Alpha-1 0.0 - 0.4 g/dL -  Alpha-2 0.4 - 1.0 g/dL -  Beta Globulin 0.7 - 1.3 g/dL -  Gamma Globulin 0.4 - 1.8 g/dL -    No results found for: G6PDH  No  results found for: TPMT   Pregnancy status:  Post-menopausal  Assessment/Plan:  Patient was counseled on the purpose, proper use, and adverse effects of leflunomide including risk of infection, nausea/diarrhea/weight loss, increase in blood pressure, rash, hair loss, tingling in the hands and feet, and signs and symptoms of interstitial lung disease.   Also counseled on Black Box warning of liver injury and importance of avoiding alcohol while on therapy. Discussed that there is the possibility of an increased risk of malignancy but it is not well understood if this increased risk is due to the medication or the disease state.  Counseled patient to avoid live vaccines. Recommend annual influenza, Pneumovax 23, Prevnar 13, and Shingrix as indicated.   Has received 3 COVID19 vaccine doses and is eligible for 2nd booster dose.   Discussed the importance of frequent monitoring of liver  function and blood count.  Standing orders placed.  Discussed importance of birth control while on leflunomide due to risk of congenital abnormalities, and patient confirms she is post-menopausal.  Provided patient with educational materials on leflunomide and answered all questions.  Patient consented to [place] use, and consent will be uploaded into the media tab.    Patient dose will be leflunomide 10mg  once daily x 2 weeks then recheck labs. We will increase to 20mg  daily if labs are stable, then recheck labs again in 2 weeks, 2 months, and every 3 months routinely thereafter. She may start the medication after she returns from a trip this upcoming month.  All questions encouraged and answered.  Knox Saliva, PharmD, MPH Clinical Pharmacist (Rheumatology and Pulmonology)

## 2020-07-02 NOTE — Patient Instructions (Signed)
Standing Labs We placed an order today for your standing lab work.   Please have your standing labs drawn in 2 weeks x2, 2 months, then every 3 months   If possible, please have your labs drawn 2 weeks prior to your appointment so that the provider can discuss your results at your appointment.  Please note that you may see your imaging and lab results in [facility] before we have reviewed them. We may be awaiting multiple results to interpret others before contacting you. Please allow our office up to 72 hours to thoroughly review all of the results before contacting the office for clarification of your results.  We have open lab daily: Monday through Thursday from 1:30-4:30 PM and Friday from 1:30-4:00 PM at the office of Dr. Bo Merino, [facility].   Please be advised, all patients with office appointments requiring lab work will take precedent over walk-in lab work.  If possible, please come for your lab work on Monday and Friday afternoons, as you may experience shorter wait times. The office is located at [address], [place], [place], [place] [address] No appointment is necessary.   Labs are drawn by [facility]. Please bring your co-pay at the time of your lab draw.  You may receive a bill from [facility] for your lab work.  If you wish to have your labs drawn at another location, please call the office 24 hours in advance to send orders.  If you have any questions regarding directions or hours of operation,  please call [phone].   As a reminder, please drink plenty of water prior to coming for your lab work. Thanks!   Leflunomide tablets What is this medicine? LEFLUNOMIDE (le FLOO na mide) is for rheumatoid arthritis. This medicine may be used for other purposes; ask your health care provider or pharmacist if you have questions. COMMON BRAND NAME(S): Arava What should I tell my health care provider before I take this medicine? They need to know if you have any  of these conditions:  diabetes  have a fever or infection  high blood pressure  immune system problems  kidney disease  liver disease  low blood cell counts, like low white cell, platelet, or red cell counts  lung or breathing disease, like asthma  recently received or scheduled to receive a vaccine  receiving treatment for cancer  skin conditions or sensitivity  tingling of the fingers or toes, or other nerve disorder  tuberculosis  an unusual or allergic reaction to leflunomide, teriflunomide, other medicines, food, dyes, or preservatives  pregnant or trying to get pregnant  breast-feeding How should I use this medicine? Take this medicine by mouth with a full glass of water. Follow the directions on the prescription label. Take your medicine at regular intervals. Do not take your medicine more often than directed. Do not stop taking except on your doctor's advice. Talk to your pediatrician regarding the use of this medicine in children. Special care may be needed. Overdosage: If you think you have taken too much of this medicine contact a poison control center or emergency room at once. NOTE: This medicine is only for you. Do not share this medicine with others. What if I miss a dose? If you miss a dose, take it as soon as you can. If it is almost time for your next dose, take only that dose. Do not take double or extra doses. What may interact with this medicine? Do not take this medicine with any of  the following medications:  teriflunomide This medicine may also interact with the following medications:  alosetron  birth control pills  caffeine  cefaclor  certain medicines for diabetes like nateglinide, repaglinide, rosiglitazone, pioglitazone  certain medicines for high cholesterol like atorvastatin, pravastatin, rosuvastatin, simvastatin  charcoal  cholestyramine  ciprofloxacin  duloxetine  furosemide  ketoprofen  live virus  vaccines  medicines that increase your risk for infection  methotrexate  mitoxantrone  paclitaxel  penicillin  theophylline  tizanidine  warfarin This list may not describe all possible interactions. Give your health care provider a list of all the medicines, herbs, non-prescription drugs, or dietary supplements you use. Also tell them if you smoke, drink alcohol, or use illegal drugs. Some items may interact with your medicine. What should I watch for while using this medicine? Visit your health care provider for regular checks on your progress. Tell your doctor or health care provider if your symptoms do not start to get better or if they get worse. You may need blood work done while you are taking this medicine. This medicine may cause serious skin reactions. They can happen weeks to months after starting the medicine. Contact your health care provider right away if you notice fevers or flu-like symptoms with a rash. The rash may be red or purple and then turn into blisters or peeling of the skin. Or, you might notice a red rash with swelling of the face, lips or lymph nodes in your neck or under your arms. This medicine may stay in your body for up to 2 years after your last dose. Tell your doctor about any unusual side effects or symptoms. A medicine can be given to help lower your blood levels of this medicine more quickly. Women must use effective birth control with this medicine. There is a potential for serious side effects to an unborn child. Do not become pregnant while taking this medicine. Inform your doctor if you wish to become pregnant. This medicine remains in your blood after you stop taking it. You must continue using effective birth control until the blood levels have been checked and they are low enough. A medicine can be given to help lower your blood levels of this medicine more quickly. Immediately talk to your doctor if you think you may be pregnant. You may need a  pregnancy test. Talk to your health care provider or pharmacist for more information. You should not receive certain vaccines during your treatment and for a certain time after your treatment with this medication ends. Talk to your health care provider for more information. What side effects may I notice from receiving this medicine? Side effects that you should report to your doctor or health care professional as soon as possible:  allergic reactions like skin rash, itching or hives, swelling of the face, lips, or tongue  breathing problems  cough  increased blood pressure  low blood counts - this medicine may decrease the number of white blood cells and platelets. You may be at increased risk for infections and bleeding.  pain, tingling, numbness in the hands or feet  rash, fever, and swollen lymph nodes  redness, blistering, peeing or loosening of the skin, including inside the mouth  signs of decreased platelets or bleeding - bruising, pinpoint red spots on the skin, black, tarry stools, blood in urine  signs of infection - fever or chills, cough, sore throat, pain or trouble passing urine  signs and symptoms of liver injury like dark yellow or brown  urine; general ill feeling or flu-like symptoms; light-colored stools; loss of appetite; nausea; right upper belly pain; unusually weak or tired; yellowing of the eyes or skin  trouble passing urine or change in the amount of urine  vomiting Side effects that usually do not require medical attention (report to your doctor or health care professional if they continue or are bothersome):  diarrhea  hair thinning or loss  headache  nausea  tiredness This list may not describe all possible side effects. Call your doctor for medical advice about side effects. You may report side effects to FDA at 1-800-FDA-1088. Where should I keep my medicine? Keep out of the reach of children. Store at room temperature between 15 and 30  degrees C (59 and 86 degrees F). Protect from moisture and light. Throw away any unused medicine after the expiration date. NOTE: This sheet is a summary. It may not cover all possible information. If you have questions about this medicine, talk to your doctor, pharmacist, or health care provider.  2021 Elsevier/Gold Standard (2018-04-29 15:06:48)

## 2020-07-03 ENCOUNTER — Other Ambulatory Visit: Payer: Self-pay

## 2020-07-03 LAB — CBC WITH DIFFERENTIAL/PLATELET
Absolute Monocytes: 481 cells/uL (ref 200–950)
Basophils Absolute: 17 cells/uL (ref 0–200)
Basophils Relative: 0.3 %
Eosinophils Absolute: 110 cells/uL (ref 15–500)
Eosinophils Relative: 1.9 %
HCT: 41.1 % (ref 35.0–45.0)
Hemoglobin: 13.7 g/dL (ref 11.7–15.5)
Lymphs Abs: 2546 cells/uL (ref 850–3900)
MCH: 31.6 pg (ref 27.0–33.0)
MCHC: 33.3 g/dL (ref 32.0–36.0)
MCV: 94.7 fL (ref 80.0–100.0)
MPV: 10.5 fL (ref 7.5–12.5)
Monocytes Relative: 8.3 %
Neutro Abs: 2645 cells/uL (ref 1500–7800)
Neutrophils Relative %: 45.6 %
Platelets: 253 10*3/uL (ref 140–400)
RBC: 4.34 10*6/uL (ref 3.80–5.10)
RDW: 12.9 % (ref 11.0–15.0)
Total Lymphocyte: 43.9 %
WBC: 5.8 10*3/uL (ref 3.8–10.8)

## 2020-07-03 LAB — COMPLETE METABOLIC PANEL WITH GFR
AG Ratio: 1.6 (calc) (ref 1.0–2.5)
ALT: 7 U/L (ref 6–29)
AST: 14 U/L (ref 10–35)
Albumin: 4.2 g/dL (ref 3.6–5.1)
Alkaline phosphatase (APISO): 67 U/L (ref 37–153)
BUN: 11 mg/dL (ref 7–25)
CO2: 28 mmol/L (ref 20–32)
Calcium: 9.9 mg/dL (ref 8.6–10.4)
Chloride: 104 mmol/L (ref 98–110)
Creat: 0.89 mg/dL (ref 0.50–0.99)
GFR, Est African American: 78 mL/min/{1.73_m2} (ref >=60)
GFR, Est Non African American: 68 mL/min/{1.73_m2} (ref >=60)
Globulin: 2.7 g/dL (calc) (ref 1.9–3.7)
Glucose, Bld: 89 mg/dL (ref 65–139)
Potassium: 3.9 mmol/L (ref 3.5–5.3)
Sodium: 139 mmol/L (ref 135–146)
Total Bilirubin: 0.3 mg/dL (ref 0.2–1.2)
Total Protein: 6.9 g/dL (ref 6.1–8.1)

## 2020-07-03 MED ORDER — LEFLUNOMIDE 10 MG PO TABS: 10.0000 mg | ORAL_TABLET | Freq: Every day | ORAL | 2 refills | Status: DC

## 2020-07-03 NOTE — Progress Notes (Unsigned)
Per Hazel Sams, PA-C:Ok to start arava 10 mg 1 tablet by mouth daily.  Patient is aware and requested the prescription is sent to [facility] on [address]. Please review prescription and send to the pharmacy. Thanks!

## 2020-07-03 NOTE — Progress Notes (Signed)
CBC and CMP WNL.  Ok to start arava 10 mg 1 tablet by mouth daily.

## 2020-08-20 ENCOUNTER — Other Ambulatory Visit: Payer: Self-pay | Admitting: Internal Medicine

## 2020-08-20 DIAGNOSIS — Z1231 Encounter for screening mammogram for malignant neoplasm of breast: Secondary | ICD-10-CM

## 2020-08-26 ENCOUNTER — Other Ambulatory Visit: Payer: Self-pay | Admitting: Cardiovascular Disease

## 2020-09-08 NOTE — Progress Notes (Signed)
Office Visit    Patient Name: Vickie Pearson Date of Encounter: 09/08/2020  PCP:  Ladell Pier, MD   [place]  Cardiologist:  Skeet Latch, MD  Advanced Practice Provider:  No care team member to display Electrophysiologist:  None   Chief Complaint    Vickie Pearson is a 65 y.o. female with a hx of palpitations, inappropriate sinus tachycardia, nonobstructive coronary artery disease, familial hyperlipidemia presents today for hypertension follow up.   Past Medical History    Past Medical History:  Diagnosis Date   Colon polyps    Coronary artery calcification of native artery 02/21/2019   Minimal calcification noted 11/2018 on coronary CTA   Costochondritis    DDD (degenerative disc disease), cervical    Degenerative arthritis of knee    Bilateral   Degenerative disc disease, cervical    Degenerative disc disease, cervical    Fibromyalgia    GERD (gastroesophageal reflux disease)    Hyperlipidemia    Inappropriate sinus tachycardia 02/21/2019   Monoclonal gammopathy    Multiple thyroid nodules    Osteoporosis    Rheumatoid arthritis ([place])    Past Surgical History:  Procedure Laterality Date   COLONOSCOPY  2012   COLONOSCOPY  2015   POLYPECTOMY      Allergies  Allergies  Allergen Reactions   Penicillins Hives    Has patient had a PCN reaction causing immediate rash, facial/tongue/throat swelling, SOB or lightheadedness with hypotension: No Has patient had a PCN reaction causing severe rash involving mucus membranes or skin necrosis: Yes Has patient had a PCN reaction that required hospitalization: No Has patient had a PCN reaction occurring within the last 10 years: No If all of the above answers are "NO", then may proceed with Cephalosporin use.    Piroxicam Hives   Sulfa Antibiotics Hives    History of Present Illness    Vickie Pearson is a 65 y.o. female with a hx of palpitations, inappropriate  sinus tachycardia, nonobstructive coronary artery disease, familial hyperlipidemia last seen 07/2019 by Dr. Oval Linsey.  She previously wore heart monitor due to palpitations. Noted to be tachycardic with rate in the 140s at rest though no significant arrhythmia. She had coronary CTA 02/2019 with minimal obstruction, calcum score 50.3 which is 80th percentile. Her LDL was 251. She was started on Repatha. She has been intolerant to multiple statins. Metoprolol previously up titrated due to palpitations. Zetia offered at last appointment but she preferred to focus on lifestyle changes. She contacted the office 03/2020 noting bradycardia and toprol was reduced to '25mg'$  QD.   She presents today for follow up. She did lose her brother a week ago due to a shooting and is understandably grieving. His funeral service is this week, offered my condolences.  Tells me her heart has been doing overall well.  Reports no shortness of breath nor dyspnea on exertion. Reports no chest pain, pressure, or tightness. No edema, orthopnea, PND. Reports on occasion she will feel her heart flutter in the morning when she wakes up but this quickly self resolves.  She wears a Fitbit for monitoring heart rate and tells me her heart rate ranges routinely in the 50s to 70s.  We discussed that although heart rate is more so concerning if she notes lightheadedness and dizziness and she tells me she does not.  She does exercise twice per week by doing a dance class.  She is starting physical therapy for her knee pain in about a  week  EKGs/Labs/Other Studies Reviewed:   The following studies were reviewed today:  EKG:  EKG is  ordered today.  The ekg ordered today demonstrates NSR 73 bpm with no acute ST/T wave changes.   Recent Labs: 09/27/2019: TSH 1.51 07/02/2020: ALT 7; BUN 11; Creat 0.89; Hemoglobin 13.7; Platelets 253; Potassium 3.9; Sodium 139  Recent Lipid Panel    Component Value Date/Time   CHOL 207 (H) 06/20/2019 0952   TRIG 79  06/20/2019 0952   HDL 70 06/20/2019 0952   CHOLHDL 3.0 06/20/2019 0952   LDLCALC 123 (H) 06/20/2019 0952    Home Medications   No outpatient medications have been marked as taking for the 09/09/20 encounter (Appointment) with Loel Dubonnet, NP.     Review of Systems     All other systems reviewed and are otherwise negative except as noted above.  Physical Exam    VS:  There were no vitals taken for this visit. , BMI There is no height or weight on file to calculate BMI.  Wt Readings from Last 3 Encounters:  07/02/20 152 lb (68.9 kg)  02/21/20 151 lb 9.6 oz (68.8 kg)  12/26/19 154 lb (69.9 kg)     GEN: Well nourished, well developed, in no acute distress. HEENT: normal. Neck: Supple, no JVD, carotid bruits, or masses. Cardiac: RRR, no murmurs, rubs, or gallops. No clubbing, cyanosis, edema.  Radials/PT 2+ and equal bilaterally.  Respiratory:  Respirations regular and unlabored, clear to auscultation bilaterally. GI: Soft, nontender, nondistended. MS: No deformity or atrophy. Skin: Warm and dry, no rash. Neuro:  Strength and sensation are intact. Psych: Normal affect.  Assessment & Plan    Palpitations / Inappropriate sinus tachycardia -well-controlled on current dose of metoprolol succinate 25 mg daily which we will continue.  Grief - Lost her brother one week ago due to a shooting. Offered my condolences. Endorses anxiety. Encouraged to discuss with her primary care provider. Offered referral to psychology Elias Else, PsyD which she tells me she will consider. Handout on grief as well as pursed lip breathing provided.   Nonobstructive CAD - Stable with no anginal symptoms. No indication for ischemic evaluation.  EKG today no acute St/T wave changes.  GDMT includes metoprolol, Repatha.  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  Repatha initiated. 511/21 LDL 123.  Repeat lipid panel ordered.  If LDL above goal of 70, plan to add Zetia.  Previous  intolerance to statin with myalgias  Disposition: Follow up in 6 month(s) with Dr. Oval Linsey  or APP.  Signed, Loel Dubonnet, NP 09/08/2020, 8:27 PM [place]

## 2020-09-09 ENCOUNTER — Ambulatory Visit (INDEPENDENT_AMBULATORY_CARE_PROVIDER_SITE_OTHER): Payer: Medicare Other | Admitting: Family

## 2020-09-09 ENCOUNTER — Other Ambulatory Visit: Payer: Self-pay

## 2020-09-09 ENCOUNTER — Encounter (HOSPITAL_BASED_OUTPATIENT_CLINIC_OR_DEPARTMENT_OTHER): Payer: Self-pay | Admitting: Family

## 2020-09-09 VITALS — BP 138/80 | HR 73 | Ht 67.0 in | Wt 149.4 lb

## 2020-09-09 DIAGNOSIS — I2510 Atherosclerotic heart disease of native coronary artery without angina pectoris: Secondary | ICD-10-CM | POA: Diagnosis not present

## 2020-09-09 DIAGNOSIS — R002 Palpitations: Secondary | ICD-10-CM | POA: Diagnosis not present

## 2020-09-09 DIAGNOSIS — T466X5A Adverse effect of antihyperlipidemic and antiarteriosclerotic drugs, initial encounter: Secondary | ICD-10-CM

## 2020-09-09 DIAGNOSIS — F4321 Adjustment disorder with depressed mood: Secondary | ICD-10-CM

## 2020-09-09 DIAGNOSIS — G720 Drug-induced myopathy: Secondary | ICD-10-CM

## 2020-09-09 DIAGNOSIS — E785 Hyperlipidemia, unspecified: Secondary | ICD-10-CM

## 2020-09-09 NOTE — Patient Instructions (Addendum)
Medication Instructions:  Continue your current medications.   *If you need a refill on your cardiac medications before your next appointment, please call your pharmacy*   Lab Work: Your physician recommends that you return for lab work in the next couple of weeks at [facility]. Please be fasting for this lab work.    If you have labs (blood work) drawn today and your tests are completely normal, you will receive your results only by: [facility] (if you have MyChart) OR A paper copy in the mail If you have any lab test that is abnormal or we need to change your treatment, we will call you to review the results.   Testing/Procedures: Your EKG today showed normal sinus rhythm which is a great result!   Follow-Up: At [facility], you and your health needs are our priority.  As part of our continuing mission to provide you with exceptional heart care, we have created designated Provider Care Teams.  These Care Teams include your primary Cardiologist (physician) and Advanced Practice Providers (APPs -  Physician Assistants and Nurse Practitioners) who all work together to provide you with the care you need, when you need it.  We recommend signing up for the patient portal called "MyChart".  Sign up information is provided on this After Visit Summary.  MyChart is used to connect with patients for Virtual Visits (Telemedicine).  Patients are able to view lab/test results, encounter notes, upcoming appointments, etc.  Non-urgent messages can be sent to your provider as well.   To learn more about what you can do with MyChart, go to [email or web address].    Your next appointment:   6 month(s)  The format for your next appointment:   In Person  Provider:   Skeet Latch, MD   Other Instructions  If you are interested in meeting with Elias Else, PsyD for grief counseling please let us know and we will gladly place the referral.   Heart Healthy Diet  Recommendations: A low-salt diet is recommended. Meats should be grilled, baked, or boiled. Avoid fried foods. Focus on lean protein sources like fish or chicken with vegetables and fruits. The American Heart Association is a Microbiologist!    Exercise recommendations: The American Heart Association recommends 150 minutes of moderate intensity exercise weekly. Try 30 minutes of moderate intensity exercise 4-5 times per week. This could include walking, jogging, or swimming.   Pursed Lip Breathing  How to perform pursed lip breathing Being short of breath can make you tense and anxious. Before you start this breathing exercise, take a minute to relax your shoulders and close your eyes. Then: Start the exercise by closing your mouth. Breathe in through your nose, taking a normal breath. You can do this at your normal rate of breathing. If you feel you are not getting enough air, breathe in while slowly counting to 2 or 3. Pucker (purse) your lips as if you were going to whistle. Gently tighten the muscles of your abdomenor press on your abdomen to help push the air out. Breathe out slowly through your pursed lips. Take at least twice as long to breathe out as it takes you to breathe in. Make sure that you breathe out all of the air, but do not force air out.  Managing Loss, Adult People experience loss in many different ways throughout their lives. Events such as moving, changing jobs, and losing friends can create a sense of loss. The loss may be as serious as a major  health change, divorce, death of a pet, or death of a loved one. All of these types of loss are likely to create a physical and emotional reaction known as grief. Grief is the result of a major change or an absence of something or someone that you count on. Grief is anormal reaction to loss. A variety of factors can affect your grieving experience, including: The nature of your loss. Your relationship to what or whom you  lost. Your understanding of grief and how to manage it. Your support system. How to manage lifestyle changes Keep to your normal routine as much as possible. If you have trouble focusing or doing normal activities, it is acceptable to take some time away from your normal routine. Spend time with friends and loved ones. Eat a healthy diet, get plenty of sleep, and rest when you feel tired. How to recognize changes  The way that you deal with your grief will affect your ability to function as you normally do. When grieving, you may experience these changes: Numbness, shock, sadness, anxiety, anger, denial, and guilt. Thoughts about death. Unexpected crying. A physical sensation of emptiness in your stomach. Problems sleeping and eating. Tiredness (fatigue). Loss of interest in normal activities. Dreaming about or imagining seeing the person who died. A need to remember what or whom you lost. Difficulty thinking about anything other than your loss for a period of time. Relief. If you have been expecting the loss for a while, you may feel a sense of relief when it happens. Follow these instructions at home: Activity Express your feelings in healthy ways, such as: Talking with others about your loss. It may be helpful to find others who have had a similar loss, such as a support group. Writing down your feelings in a journal. Doing physical activities to release stress and emotional energy. Doing creative activities like painting, sculpting, or playing or listening to music. Practicing resilience. This is the ability to recover and adjust after facing challenges. Reading some resources that encourage resilience may help you to learn ways to practice those behaviors.  General instructions Be patient with yourself and others. Allow the grieving process to happen, and remember that grieving takes time. It is likely that you may never feel completely done with some grief. You may find a way to  move on while still cherishing memories and feelings about your loss. Accepting your loss is a process. It can take months or longer to adjust. Keep all follow-up visits as told by your health care provider. This is important. Where to find support To get support for managing loss: Ask your health care provider for help and recommendations, such as grief counseling or therapy. Think about joining a support group for people who are managing a loss. Where to find more information You can find more information about managing loss from: American Society of Clinical Oncology: www.cancer.net American Psychological Association: TVStereos.ch Contact a health care provider if: Your grief is extreme and keeps getting worse. You have ongoing grief that does not improve. Your body shows symptoms of grief, such as illness. You feel depressed, anxious, or lonely. Get help right away if: You have thoughts about hurting yourself or others. If you ever feel like you may hurt yourself or others, or have thoughts about taking your own life, get help right away. You can go to your nearest emergency department or call: Your local emergency services (911 in the [place] A suicide crisis helpline, such as the [place] Suicide  Prevention Lifeline at (985)798-3066. This is open 24 hours a day. Summary Grief is the result of a major change or an absence of someone or something that you count on. Grief is a normal reaction to loss. The depth of grief and the period of recovery depend on the type of loss and your ability to adjust to the change and process your feelings. Processing grief requires patience and a willingness to accept your feelings and talk about your loss with people who are supportive. It is important to find resources that work for you and to realize that people experience grief differently. There is not one grieving process that works for everyone in the same way. Be aware that when grief becomes  extreme, it can lead to more severe issues like isolation, depression, anxiety, or suicidal thoughts. Talk with your health care provider if you have any of these issues. This information is not intended to replace advice given to you by your health care provider. Make sure you discuss any questions you have with your healthcare provider. Document Revised: 07/20/2019 Document Reviewed: 07/20/2019 Elsevier Patient Education  [place].

## 2020-09-16 ENCOUNTER — Encounter: Payer: Self-pay | Admitting: Internal Medicine

## 2020-09-16 DIAGNOSIS — F419 Anxiety disorder, unspecified: Secondary | ICD-10-CM

## 2020-09-16 DIAGNOSIS — F4321 Adjustment disorder with depressed mood: Secondary | ICD-10-CM

## 2020-09-18 MED ORDER — SERTRALINE HCL 50 MG PO TABS: ORAL_TABLET | ORAL | 1 refills | Status: DC

## 2020-09-18 NOTE — Addendum Note (Signed)
Addended by: Karle Plumber B on: 09/18/2020 09:12 AM   Modules accepted: Orders

## 2020-09-23 ENCOUNTER — Telehealth: Payer: Self-pay | Admitting: Clinical

## 2020-09-23 NOTE — Telephone Encounter (Signed)
[facility] accepts Medicare B. They have several people of color on staff.

## 2020-09-25 NOTE — Telephone Encounter (Signed)
Referral made to [facility] through online portal on 09/25/20.

## 2020-09-26 ENCOUNTER — Other Ambulatory Visit: Payer: Self-pay

## 2020-09-26 MED ORDER — REPATHA PUSHTRONEX SYSTEM 420 MG/3.5ML ~~LOC~~ SOCT: SUBCUTANEOUS | 11 refills | Status: DC

## 2020-10-01 LAB — LIPID PANEL
Chol/HDL Ratio: 3.2 ratio (ref 0.0–4.4)
Cholesterol, Total: 205 mg/dL — ABNORMAL HIGH (ref 100–199)
HDL: 65 mg/dL (ref >39)
LDL Chol Calc (NIH): 121 mg/dL — ABNORMAL HIGH (ref 0–99)
Triglycerides: 107 mg/dL (ref 0–149)
VLDL Cholesterol Cal: 19 mg/dL (ref 5–40)

## 2020-10-01 LAB — COMPREHENSIVE METABOLIC PANEL
ALT: 8 IU/L (ref 0–32)
AST: 15 IU/L (ref 0–40)
Albumin/Globulin Ratio: 1.5 (ref 1.2–2.2)
Albumin: 4.1 g/dL (ref 3.8–4.8)
Alkaline Phosphatase: 85 IU/L (ref 44–121)
BUN/Creatinine Ratio: 14 (ref 12–28)
BUN: 12 mg/dL (ref 8–27)
Bilirubin Total: 0.2 mg/dL (ref 0.0–1.2)
CO2: 23 mmol/L (ref 20–29)
Calcium: 10.1 mg/dL (ref 8.7–10.3)
Chloride: 102 mmol/L (ref 96–106)
Creatinine, Ser: 0.84 mg/dL (ref 0.57–1.00)
Globulin, Total: 2.8 g/dL (ref 1.5–4.5)
Glucose: 85 mg/dL (ref 65–99)
Potassium: 5.1 mmol/L (ref 3.5–5.2)
Sodium: 141 mmol/L (ref 134–144)
Total Protein: 6.9 g/dL (ref 6.0–8.5)
eGFR: 77 mL/min/{1.73_m2} (ref >59)

## 2020-10-02 ENCOUNTER — Other Ambulatory Visit: Payer: Self-pay

## 2020-10-02 ENCOUNTER — Encounter: Payer: Self-pay | Admitting: Endocrinology

## 2020-10-02 ENCOUNTER — Ambulatory Visit (INDEPENDENT_AMBULATORY_CARE_PROVIDER_SITE_OTHER): Payer: Medicare Other | Admitting: Endocrinology

## 2020-10-02 VITALS — BP 140/86 | HR 76 | Ht 67.0 in | Wt 147.4 lb

## 2020-10-02 DIAGNOSIS — M810 Age-related osteoporosis without current pathological fracture: Secondary | ICD-10-CM | POA: Diagnosis not present

## 2020-10-02 DIAGNOSIS — E042 Nontoxic multinodular goiter: Secondary | ICD-10-CM | POA: Diagnosis not present

## 2020-10-02 LAB — VITAMIN D 25 HYDROXY (VIT D DEFICIENCY, FRACTURES): VITD: 43.47 ng/mL (ref 30.00–100.00)

## 2020-10-02 LAB — T4, FREE: Free T4: 0.88 ng/dL (ref 0.60–1.60)

## 2020-10-02 LAB — BASIC METABOLIC PANEL
BUN: 10 mg/dL (ref 6–23)
CO2: 26 mEq/L (ref 19–32)
Calcium: 9.7 mg/dL (ref 8.4–10.5)
Chloride: 105 mEq/L (ref 96–112)
Creatinine, Ser: 0.89 mg/dL (ref 0.40–1.20)
GFR: 67.45 mL/min (ref >60.00)
Glucose, Bld: 90 mg/dL (ref 70–99)
Potassium: 3.9 mEq/L (ref 3.5–5.1)
Sodium: 139 mEq/L (ref 135–145)

## 2020-10-02 LAB — TSH: TSH: 0.93 u[IU]/mL (ref 0.35–5.50)

## 2020-10-02 NOTE — Progress Notes (Signed)
Subjective:    Patient ID: Vickie Pearson, female    DOB: 06/09/1955, 65 y.o.   MRN: [id number]  HPI Pt returns for f/u of osteoporosis: Dx'ed: 2018 Secondary cause: prednisone for RA (2015-2018).  steroid injections into the knees.  She smoked 1979-1989 Fractures: none Past rx: 1 dose of reclast (2018, in [place]) Current rx: reclast since 2021 Last DEXA result (2021): worst T-score was -3.0 (LFN).  Other: none Interval hx: She did not get the reclast infusion.  No change in chronic low back pain.  She still gets steroid injections into the knees.  She takes Vit-D, 3000 mg/d.  She also has h/o MNG (dx'ed approx 2019; she has never had thyroid bx; she has been euthyroid off any rx).  Past Medical History:  Diagnosis Date   Colon polyps    Coronary artery calcification of native artery 02/21/2019   Minimal calcification noted 11/2018 on coronary CTA   Costochondritis    DDD (degenerative disc disease), cervical    Degenerative arthritis of knee    Bilateral   Degenerative disc disease, cervical    Degenerative disc disease, cervical    Fibromyalgia    GERD (gastroesophageal reflux disease)    Hyperlipidemia    Inappropriate sinus tachycardia 02/21/2019   Monoclonal gammopathy    Multiple thyroid nodules    Osteoporosis    Rheumatoid arthritis ([place])     Past Surgical History:  Procedure Laterality Date   COLONOSCOPY  2012   COLONOSCOPY  2015   POLYPECTOMY      Social History   Socioeconomic History   Marital status: Widowed    Spouse name: Not on file   Number of children: Not on file   Years of education: Not on file   Highest education level: Not on file  Occupational History   Not on file  Tobacco Use   Smoking status: Former    Packs/day: 1.00    Types: Cigarettes    Quit date: 07/24/1988    Years since quitting: 32.2   Smokeless tobacco: Never  Vaping Use   Vaping Use: Never used  Substance and Sexual Activity   Alcohol use: Yes    Comment:  occasionally   Drug use: No   Sexual activity: Not on file  Other Topics Concern   Not on file  Social History Narrative   Not on file   Social Determinants of Health   Financial Resource Strain: Not on file  Food Insecurity: Not on file  Transportation Needs: Not on file  Physical Activity: Not on file  Stress: Not on file  Social Connections: Not on file  Intimate Partner Violence: Not on file    Current Outpatient Medications on File Prior to Visit  Medication Sig Dispense Refill   cholecalciferol (VITAMIN D) 1000 units tablet Take 3,000 Units by mouth daily.     Evolocumab with Infusor ([place]) 420 MG/3.5ML SOCT INJECT 1 APPLICATOR INTO THE SKIN EVERY 30 DAYS 3.5 mL 11   fluticasone (FLONASE) 50 MCG/ACT nasal spray Place 1 spray into both nostrils as needed for allergies or rhinitis.     Lactobacillus (PROBIOTIC ACIDOPHILUS PO) Take 1 capsule by mouth daily.     Magnesium Citrate 100 MG TABS Take by mouth daily as needed.      metoprolol succinate (TOPROL-XL) 25 MG 24 hr tablet Take 1 tablet (25 mg total) by mouth daily. Take with or immediately following a meal. 90 tablet 2   sertraline (ZOLOFT) 50 MG tablet  1/2 tab PO daily x 4 weeks then 1 tab daily 30 tablet 1   vitamin B-12 (CYANOCOBALAMIN) 1000 MCG tablet Take 1 tablet by mouth daily.     zinc sulfate 220 (50 Zn) MG capsule Take by mouth daily.      leflunomide (ARAVA) 10 MG tablet Take 1 tablet (10 mg total) by mouth daily. (Patient not taking: Reported on 09/09/2020) 30 tablet 2   No current facility-administered medications on file prior to visit.    Allergies  Allergen Reactions   Penicillins Hives    Has patient had a PCN reaction causing immediate rash, facial/tongue/throat swelling, SOB or lightheadedness with hypotension: No Has patient had a PCN reaction causing severe rash involving mucus membranes or skin necrosis: Yes Has patient had a PCN reaction that required hospitalization: No Has  patient had a PCN reaction occurring within the last 10 years: No If all of the above answers are "NO", then may proceed with Cephalosporin use.    Piroxicam Hives   Sulfa Antibiotics Hives    Family History  Problem Relation Age of Onset   Hypertension Mother    Colon polyps Mother    Stroke Mother    Arthritis Mother    Hypertension Father    Heart disease Father    Arthritis Father    Kidney disease Father    Hypertension Sister    Hyperlipidemia Sister    Hypertension Brother    Hyperlipidemia Brother    Esophageal cancer Neg Hx    Rectal cancer Neg Hx    Stomach cancer Neg Hx    Osteoporosis Neg Hx     BP 140/86 (BP Location: Right Arm, Patient Position: Sitting, Cuff Size: Normal)   Pulse 76   Ht '5\' 7"'$  (1.702 m)   Wt 147 lb 6.4 oz (66.9 kg)   SpO2 98%   BMI 23.09 kg/m      Review of Systems Denies falls.      Objective:   Physical Exam VITAL SIGNS:  See vs page GENERAL: no distress NECK: 2 cm left thyroid nodule is noted GAIT: normal and steady.    25-OH Vit-D=43    Assessment & Plan:  Vit-D def: well-controlled.  Please continue the same Vit-D Osteoporosis: uncontrolled MNG, due for recheck  Patient Instructions  Blood tests are requested for you today.  We'll let you know about the results.   Take calcium 1200 mg per day.   We should resume the Reclast.  you will receive a phone call, about a day and time for an appointment.   Let's recheck the ultrasound.  you will receive a phone call, about a day and time for an appointment.  Please come back for a follow-up appointment in 1 year. '

## 2020-10-02 NOTE — Patient Instructions (Addendum)
Blood tests are requested for you today.  We'll let you know about the results.   Take calcium 1200 mg per day.   We should resume the Reclast.  you will receive a phone call, about a day and time for an appointment.   Let's recheck the ultrasound.  you will receive a phone call, about a day and time for an appointment.  Please come back for a follow-up appointment in 1 year.

## 2020-10-03 LAB — PTH, INTACT AND CALCIUM
Calcium: 9.7 mg/dL (ref 8.6–10.4)
PTH: 64 pg/mL (ref 16–77)

## 2020-10-07 ENCOUNTER — Other Ambulatory Visit: Payer: Self-pay

## 2020-10-07 ENCOUNTER — Ambulatory Visit: Payer: Medicare Other | Attending: Internal Medicine | Admitting: Clinical

## 2020-10-07 DIAGNOSIS — F4323 Adjustment disorder with mixed anxiety and depressed mood: Secondary | ICD-10-CM

## 2020-10-08 ENCOUNTER — Other Ambulatory Visit: Payer: Medicare Other

## 2020-10-08 NOTE — BH Specialist Note (Signed)
Integrated Behavioral Health Initial In-Person Visit  MRN: [id number] Name: Vickie Pearson  Number of [facility] Clinician visits:: 1/6 Session Start time: 1:30pm  Session End time: 2:30pm Total time: 60 minutes  Types of Service: Individual psychotherapy  Interpretor:No. Interpretor Name and Language: N/A   Warm Hand Off Completed.        Subjective: Vickie Pearson is a 65 y.o. female accompanied by  self Patient was referred by PCP Wynetta Emery for grief, depression, and anxiety. Patient reports the following symptoms/concerns: Pt reports feeling depressed at times, difficulty sleeping, feelings of guilt, anxiousness, racing thoughts, excessive worrying, and difficulty relaxing. Reports that her brother passed away about 1.5 months ago. Reports that she is having difficulty with grieving the loss of her brother. Reports that she has several questions surrounding the death of her brother and has not had "closure". Reports that she found her brother in his house and there is an open investigation. Reports worrying about that her brother was killed which leads to her to having difficulty sleeping and researching information on the Internet. Reports feeling lonely due to majority of her family staying in other states and leaving after the funeral.  Duration of problem: 1.5 months; Severity of problem: moderate  Objective: Mood: Anxious and Depressed and Affect: Appropriate Risk of harm to self or others: No plan to harm self or others  Life Context: Family and Social: Reports that she lives alone. Reports her support system as friends and her daughter. Reports that majority of her family does not live in [place]. Reports that her husband is deceased.  School/Work: Pt is retired.  Self-Care: Reports prayer, church, and spending time with friends as coping skills. Reports that she was prescribed medication from PCP for depression but has not started taking it.   Life Changes: Reports that her brother passed away 1.5 months ago and she believes he was killed. Reports that he was her only relative that lived in [place]. Reports that she came to her brother's home and found him dead. Reports difficulty with accepting the loss of her brother and wanting "closure'. Reports that there is an open investigation on her brother's death.   Patient and/or Family's Strengths/Protective Factors: Social and Emotional competence and Concrete supports in place (healthy food, safe environments, etc.)  Goals Addressed: Patient will: Reduce symptoms of: anxiety and depression Increase knowledge and/or ability of: coping skills  Demonstrate ability to: Begin healthy grieving over loss  Progress towards Goals: Ongoing  Interventions: Interventions utilized: Mindfulness or Psychologist, educational, CBT Cognitive Behavioral Therapy, Supportive Counseling, Sleep Hygiene, and Psychoeducation and/or Health Education  Standardized Assessments completed: GAD-7 and PHQ 9  Patient and/or Family Response: Pt receptive to tx. Pt receptive to psychoeducation provided on the stages of grief and sleep. Pt receptive to cognitive restructuring in order to improve cognitive processing skills. Pt will begin relaxation techniques and decrease time spent on her phone at night in order to improve sleep.   Patient Centered Plan: Patient is on the following Treatment Plan(s):  Grief, Depression, and Anxiety  Assessment: Denies SI/HI. Denies auditory/visual hallucinations. No safety risks. Patient currently experiencing depression and anxiety as an adjustment reaction to grief. Pt appears to have difficulty accepting the loss of her brother. Pt has difficulty with processing his death to ongoing questions about how he died per pt continuing to read information on the internet. Pt has strengths and coping skills to assist with grieving. Pt appears to feel alone as a result of family not living  in  [place].    Patient may benefit from brief therapy to assist with grief. Pt would benefit from medication management to assist with anxiety and sleep. LCSWA encouraged pt to adhere to current medication and will inform PCP as pt is requesting an adjustment to her medication. Pt would benefit from further CBT to assist with cognitive processing and grief. LCSWA provided psychoeducation on grief and sleep. LCSWA assisted pt in identifying healthy coping skills and encouraged pt to incorporate daily. LCSWA encouraged pt to utilize deep breathing exercises and encouraged pt to consider visiting family as psychoeducation was provided on the benefit to being around family when grieving. Pt was referred to [facility] by care coordinator however pt was informed that therapist she was referred to has a waitlist as she requested a therapist of color. LCSWA will fu with pt while pt is currently on waitlist with [facility].    Plan: Follow up with behavioral health clinician on : 10/22/20 Behavioral recommendations: Utilize deep breathing exercises, begin putting phone down about one hour prior to going to sleep, increase healthy coping skills via watching favorite sitcom, and consider visiting daughter Referral(s): [facility] (In Clinic) and Counselor(Pt was referred to [facility] and was informed that therapist had a wait list so pt will fu with LCSWA until waitlist opens) "From scale of 1-10, how likely are you to follow plan?": 10  Tamim Skog C Zaray Gatchel, LCSW

## 2020-10-09 ENCOUNTER — Other Ambulatory Visit: Payer: Self-pay

## 2020-10-09 ENCOUNTER — Ambulatory Visit
Admission: RE | Admit: 2020-10-09 | Discharge: 2020-10-09 | Disposition: A | Discharge status: Home / Self Care | Payer: Medicare Other | Source: Ambulatory Visit | Attending: Endocrinology | Admitting: Endocrinology

## 2020-10-09 ENCOUNTER — Telehealth: Payer: Self-pay

## 2020-10-09 ENCOUNTER — Telehealth: Payer: Self-pay | Admitting: Clinical

## 2020-10-09 ENCOUNTER — Encounter: Payer: Self-pay | Admitting: Internal Medicine

## 2020-10-09 DIAGNOSIS — E042 Nontoxic multinodular goiter: Secondary | ICD-10-CM

## 2020-10-09 DIAGNOSIS — E785 Hyperlipidemia, unspecified: Secondary | ICD-10-CM

## 2020-10-09 MED ORDER — EZETIMIBE 10 MG PO TABS: 10.0000 mg | ORAL_TABLET | Freq: Every day | ORAL | 3 refills | Status: DC

## 2020-10-09 NOTE — Telephone Encounter (Signed)
-----   Message from Loel Dubonnet, NP sent at 10/01/2020  2:53 PM EDT ----- Normal kidney, liver, and electrolytes. Lipid panel shows LDL not at goal <70. Please sure taking Repatha as prescribed. Recommend addition of Zetia '10mg'$  daily (as previously recommended by Dr. Oval Linsey) and repeat LFT/FLP in 8 weeks.

## 2020-10-11 ENCOUNTER — Encounter: Payer: Self-pay | Admitting: Family

## 2020-10-11 ENCOUNTER — Ambulatory Visit: Payer: Medicare Other | Attending: Family | Admitting: Family

## 2020-10-11 ENCOUNTER — Other Ambulatory Visit: Payer: Self-pay

## 2020-10-11 VITALS — BP 121/74 | HR 61 | Ht 67.0 in | Wt 149.4 lb

## 2020-10-11 DIAGNOSIS — Z0000 Encounter for general adult medical examination without abnormal findings: Secondary | ICD-10-CM | POA: Diagnosis not present

## 2020-10-11 NOTE — Progress Notes (Signed)
Subjective:   Vickie Pearson is a 65 y.o. female who presents for an Initial Medicare Annual Wellness Visit.  Review of Systems    -unremarkable Cardiac Risk Factors include: advanced age (>76mn, >>61women);dyslipidemia     Objective:    Today's Vitals   10/11/20 1343 10/11/20 1348 10/11/20 1400  BP: 121/74    Pulse: 61    SpO2: 100%    Weight: 149 lb 6.4 oz (67.8 kg)    Height: '5\' 7"'$  (1.702 m)    PainSc:  4  4    Body mass index is 23.4 kg/m.  Advanced Directives 10/11/2020 02/18/2017 11/18/2016 10/01/2016  Does Patient Have a Medical Advance Directive? No No No No  Would patient like information on creating a medical advance directive? - No - Patient declined No - Patient declined No - Patient declined    Current Medications (verified) Outpatient Encounter Medications as of 10/11/2020  Medication Sig   cholecalciferol (VITAMIN D) 1000 units tablet Take 3,000 Units by mouth daily.   Evolocumab with Infusor ([place] 420 MG/3.5ML SOCT INJECT 1 APPLICATOR INTO THE SKIN EVERY 30 DAYS   ezetimibe (ZETIA) 10 MG tablet Take 1 tablet (10 mg total) by mouth daily.   fluticasone (FLONASE) 50 MCG/ACT nasal spray Place 1 spray into both nostrils as needed for allergies or rhinitis.   Lactobacillus (PROBIOTIC ACIDOPHILUS PO) Take 1 capsule by mouth daily.   Magnesium Citrate 100 MG TABS Take by mouth daily as needed.    metoprolol succinate (TOPROL-XL) 25 MG 24 hr tablet Take 1 tablet (25 mg total) by mouth daily. Take with or immediately following a meal.   sertraline (ZOLOFT) 50 MG tablet 1/2 tab PO daily x 4 weeks then 1 tab daily   vitamin B-12 (CYANOCOBALAMIN) 1000 MCG tablet Take 1 tablet by mouth daily.   zinc sulfate 220 (50 Zn) MG capsule Take by mouth daily.    leflunomide (ARAVA) 10 MG tablet Take 1 tablet (10 mg total) by mouth daily. (Patient not taking: Reported on 09/09/2020)   No facility-administered encounter medications on file as of 10/11/2020.     Allergies (verified) Penicillins, Piroxicam, and Sulfa antibiotics   History: Past Medical History:  Diagnosis Date   Colon polyps    Coronary artery calcification of native artery 02/21/2019   Minimal calcification noted 11/2018 on coronary CTA   Costochondritis    DDD (degenerative disc disease), cervical    Degenerative arthritis of knee    Bilateral   Degenerative disc disease, cervical    Degenerative disc disease, cervical    Fibromyalgia    GERD (gastroesophageal reflux disease)    Hyperlipidemia    Inappropriate sinus tachycardia 02/21/2019   Monoclonal gammopathy    Multiple thyroid nodules    Osteoporosis    Rheumatoid arthritis ([place]    Past Surgical History:  Procedure Laterality Date   COLONOSCOPY  2012   COLONOSCOPY  2015   POLYPECTOMY     Family History  Problem Relation Age of Onset   Hypertension Mother    Colon polyps Mother    Stroke Mother    Arthritis Mother    Hypertension Father    Heart disease Father    Arthritis Father    Kidney disease Father    Hypertension Sister    Hyperlipidemia Sister    Hypertension Brother    Hyperlipidemia Brother    Esophageal cancer Neg Hx    Rectal cancer Neg Hx    Stomach cancer Neg Hx  Osteoporosis Neg Hx    Social History   Socioeconomic History   Marital status: Widowed    Spouse name: Not on file   Number of children: Not on file   Years of education: Not on file   Highest education level: Not on file  Occupational History   Not on file  Tobacco Use   Smoking status: Former    Packs/day: 1.00    Types: Cigarettes    Quit date: 07/24/1988    Years since quitting: 32.2   Smokeless tobacco: Never  Vaping Use   Vaping Use: Never used  Substance and Sexual Activity   Alcohol use: Yes    Comment: occasionally   Drug use: No   Sexual activity: Not on file  Other Topics Concern   Not on file  Social History Narrative   Not on file   Social Determinants of Health   Financial  Resource Strain: Not on file  Food Insecurity: Not on file  Transportation Needs: Not on file  Physical Activity: Not on file  Stress: Not on file  Social Connections: Not on file    Tobacco Counseling Counseling given: Not Answered   Clinical Intake:  Pre-visit preparation completed: No  Pain : 0-10 Pain Score: 4  Pain Type: Chronic pain Pain Location: Knee Pain Orientation: Right Pain Descriptors / Indicators: Aching Pain Frequency: Occasional     Nutritional Risks: None Diabetes: No  How often do you need to have someone help you when you read instructions, pamphlets, or other written materials from your doctor or pharmacy?: 1 - Never What is the last grade level you completed in school?: 4 years of college  Diabetic?no  Interpreter Needed?: No      Activities of Daily Living In your present state of health, do you have any difficulty performing the following activities: 10/11/2020 10/11/2020  Hearing? N N  Vision? N N  Difficulty concentrating or making decisions? N N  Walking or climbing stairs? Y N  Comment occasionally -  Dressing or bathing? N N  Doing errands, shopping? N N  Preparing Food and eating ? Y N  Using the Toilet? Y N  In the past six months, have you accidently leaked urine? N N  Do you have problems with loss of bowel control? N N  Managing your Medications? N N  Managing your Finances? N N  Housekeeping or managing your Housekeeping? N N  Some recent data might be hidden    Patient Care Team: Ladell Pier, MD as PCP - General (Internal Medicine) Skeet Latch, MD as PCP - Cardiology (Cardiology)  Indicate any recent Medical Services you may have received from other than [facility] providers in the past year (date may be approximate).     Assessment:   This is a routine wellness examination for Vickie Pearson.  Hearing/Vision screen No results found.  Dietary issues and exercise activities discussed: Current Exercise Habits:  Structured exercise class;Home exercise routine, Type of exercise: Other - see comments (Dancing, swimming), Time (Minutes): 60, Frequency (Times/Week): 4, Weekly Exercise (Minutes/Week): 240, Intensity: Moderate, Exercise limited by: None identified   Goals Addressed   None   Depression Screen PHQ 2/9 Scores 10/11/2020 10/07/2020 04/21/2019 10/01/2016 07/24/2016  PHQ - 2 Score 0 2 0 0 2  PHQ- 9 Score - 6 - - 9    Fall Risk Fall Risk  10/11/2020 10/11/2020 04/21/2019 07/24/2016  Falls in the past year? 0 0 0 No  Number falls in past yr: 0  0 - -  Injury with Fall? - 0 - -  Risk for fall due to : No Fall Risks - - -    FALL RISK PREVENTION PERTAINING TO THE HOME:  Any stairs in or around the home? No  If so, are there any without handrails? No  Home free of loose throw rugs in walkways, pet beds, electrical cords, etc? Yes  Adequate lighting in your home to reduce risk of falls? Yes   ASSISTIVE DEVICES UTILIZED TO PREVENT FALLS:  Life alert? No  Use of a cane, walker or w/c? No  Grab bars in the bathroom? No  Shower chair or bench in shower? No  Elevated toilet seat or a handicapped toilet? No   TIMED UP AND GO:  Was the test performed? Yes .  Length of time to ambulate 10 feet: 2 sec.   Gait steady and fast with assistive device  Cognitive Function:        Immunizations Immunization History  Administered Date(s) Administered   Fluad Quad(high Dose 65+) 02/21/2020   Influenza, High Dose Seasonal PF 11/10/2018   Influenza, Quadrivalent, Recombinant, Inj, Pf 11/15/2017   Influenza,inj,Quad PF,6+ Mos 11/09/2016, 11/10/2018   PFIZER(Purple Top)SARS-COV-2 Vaccination 03/17/2019, 04/07/2019, 11/17/2019    TDAP status: Up to date  Flu Vaccine status: Up to date  Pneumococcal vaccine status: Up to date  Covid-19 vaccine status: Information provided on how to obtain vaccines.   Qualifies for Shingles Vaccine? Yes   Zostavax completed No   Shingrix Completed?: No.     Education has been provided regarding the importance of this vaccine. Patient has been advised to call insurance company to determine out of pocket expense if they have not yet received this vaccine. Advised may also receive vaccine at local pharmacy or Health Dept. Verbalized acceptance and understanding.  Screening Tests Health Maintenance  Topic Date Due   TETANUS/TDAP  Never done   Zoster Vaccines- Shingrix (1 of 2) Never done   PNA vac Low Risk Adult (1 of 2 - PCV13) Never done   COVID-19 Vaccine (4 - Booster for Pfizer series) 02/17/2020   INFLUENZA VACCINE  09/09/2020   MAMMOGRAM  07/06/2021   COLONOSCOPY (Pts 45-33yr Insurance coverage will need to be confirmed)  09/18/2024   DEXA SCAN  Completed   Hepatitis C Screening  Completed   HPV VACCINES  Aged Out    Health Maintenance  Health Maintenance Due  Topic Date Due   TETANUS/TDAP  Never done   Zoster Vaccines- Shingrix (1 of 2) Never done   PNA vac Low Risk Adult (1 of 2 - PCV13) Never done   COVID-19 Vaccine (4 - Booster for Pfizer series) 02/17/2020   INFLUENZA VACCINE  09/09/2020    Colorectal cancer screening: No longer required.   Mammogram status: No longer required due to  .    Lung Cancer Screening: (Low Dose CT Chest recommended if Age 821-80years, 30 pack-year currently smoking OR have quit w/in 15years.) does not qualify.   Lung Cancer Screening Referral:   Additional Screening:  Hepatitis C Screening: does not qualify; Completed   Vision Screening: Recommended annual ophthalmology exams for early detection of glaucoma and other disorders of the eye. Is the patient up to date with their annual eye exam?  Yes  Who is the provider or what is the name of the office in which the patient attends annual eye exams?  If pt is not established with a provider, would they like to be referred  to a provider to establish care? No .   Dental Screening: Recommended annual dental exams for proper oral  hygiene  Community Resource Referral / Chronic Care Management: CRR required this visit?  No   CCM required this visit?  No      Plan:     I have personally reviewed and noted the following in the patient's chart:   Medical and social history Use of alcohol, tobacco or illicit drugs  Current medications and supplements including opioid prescriptions. Patient is not currently taking opioid prescriptions. Functional ability and status Nutritional status Physical activity Advanced directives List of other physicians Hospitalizations, surgeries, and ER visits in previous 12 months Vitals Screenings to include cognitive, depression, and falls Referrals and appointments  In addition, I have reviewed and discussed with patient certain preventive protocols, quality metrics, and best practice recommendations. A written personalized care plan for preventive services as well as general preventive health recommendations were provided to patient.     Feliberto Gottron, FNP   10/11/2020   Nurse Notes:

## 2020-10-15 ENCOUNTER — Ambulatory Visit
Admission: RE | Admit: 2020-10-15 | Discharge: 2020-10-15 | Disposition: A | Discharge status: Home / Self Care | Payer: Medicare Other | Source: Ambulatory Visit | Attending: Internal Medicine | Admitting: Internal Medicine

## 2020-10-15 ENCOUNTER — Telehealth (HOSPITAL_BASED_OUTPATIENT_CLINIC_OR_DEPARTMENT_OTHER): Payer: Medicare Other | Admitting: Internal Medicine

## 2020-10-15 ENCOUNTER — Other Ambulatory Visit: Payer: Self-pay

## 2020-10-15 DIAGNOSIS — Z1231 Encounter for screening mammogram for malignant neoplasm of breast: Secondary | ICD-10-CM

## 2020-10-15 DIAGNOSIS — F4322 Adjustment disorder with anxiety: Secondary | ICD-10-CM

## 2020-10-15 MED ORDER — HYDROXYZINE PAMOATE 25 MG PO CAPS: 25.0000 mg | ORAL_CAPSULE | Freq: Two times a day (BID) | ORAL | 1 refills | Status: DC | PRN

## 2020-10-15 NOTE — Progress Notes (Signed)
Virtual Visit via Video Note  I connected with Vickie Pearson on 10/15/20 at 8:11 a.m by a video enabled telemedicine application and verified that I am speaking with the correct person using two identifiers.  Location: Patient: home Provider: office   I discussed the limitations of evaluation and management by telemedicine and the availability of in person appointments. The patient expressed understanding and agreed to proceed.  History of Present Illness: Hx of RA, monoclonal gammopathy, familial HL and non-obstrutive CAD, Vit D def, osteoporosis,thyroid nodules (followed by Dr. Loanne Drilling), cervical radiculopathy, fibromyalgia.  Today's visit is to discuss management of anxiety.  Patient had sent me a MyChart message about 1 month ago stating that she was having a hard time coping with the recent loss of her brother and requesting medication to help with anxiety.  We started her on Zoloft and I referred her to behavioral health.  Patient had an in person visit with our LCSW on 10/07/2020.  She reported difficulty sleeping, anxiety, racing thoughts and excessive worrying related to her brother's death.  She wanted to try something as needed for anxiety rather than take a daily pill.  Patient tells me today that she never started the Zoloft because she read some of the side effects.  She states that she also does not like taking a whole lot of medications and did not want to take a daily pill.  Main issue is with anxiety.  States that she has some grief but is managing that.   Outpatient Encounter Medications as of 10/15/2020  Medication Sig   cholecalciferol (VITAMIN D) 1000 units tablet Take 3,000 Units by mouth daily.   Evolocumab with Infusor ([place]) 420 MG/3.5ML SOCT INJECT 1 APPLICATOR INTO THE SKIN EVERY 30 DAYS   ezetimibe (ZETIA) 10 MG tablet Take 1 tablet (10 mg total) by mouth daily.   fluticasone (FLONASE) 50 MCG/ACT nasal spray Place 1 spray into both  nostrils as needed for allergies or rhinitis.   Lactobacillus (PROBIOTIC ACIDOPHILUS PO) Take 1 capsule by mouth daily.   leflunomide (ARAVA) 10 MG tablet Take 1 tablet (10 mg total) by mouth daily. (Patient not taking: Reported on 09/09/2020)   Magnesium Citrate 100 MG TABS Take by mouth daily as needed.    metoprolol succinate (TOPROL-XL) 25 MG 24 hr tablet Take 1 tablet (25 mg total) by mouth daily. Take with or immediately following a meal.   sertraline (ZOLOFT) 50 MG tablet 1/2 tab PO daily x 4 weeks then 1 tab daily   vitamin B-12 (CYANOCOBALAMIN) 1000 MCG tablet Take 1 tablet by mouth daily.   zinc sulfate 220 (50 Zn) MG capsule Take by mouth daily.    No facility-administered encounter medications on file as of 10/15/2020.      Observations/Objective: Older African-American female in NAD  Assessment and Plan: 1. Adjustment disorder with anxious mood I discussed with her the benefits of Zoloft and length of proposed treatment.  I told her that once the anxiety is under control on the medication we can continue it for about 3 months then discontinue it if she does not want to continue taking it.  However patient prefers a medication to use as needed.  She is agreeable to trying a low-dose of hydroxyzine.  Informed that the medication can cause drowsiness.  First time she takes the medication she should be at home to see how the medication affects her as she may not be able to drive or operate machinery when she takes the medicine.  She has a follow-up appointment with our LCSW again on the 13th of this month. - hydrOXYzine (VISTARIL) 25 MG capsule; Take 1 capsule (25 mg total) by mouth 2 (two) times daily as needed.  Dispense: 30 capsule; Refill: 1   Follow Up Instructions: As needed.   I discussed the assessment and treatment plan with the patient. The patient was provided an opportunity to ask questions and all were answered. The patient agreed with the plan and demonstrated an  understanding of the instructions.   The patient was advised to call back or seek an in-person evaluation if the symptoms worsen or if the condition fails to improve as anticipated.  I spent 11 minutes dedicated to the care of this patient on the date of this encounter to include previsit review of of notes from the LCSW and cardiology, face-to-face time with the patient and postvisit entering of order.   Karle Plumber, MD

## 2020-10-17 ENCOUNTER — Other Ambulatory Visit: Payer: Self-pay | Admitting: Internal Medicine

## 2020-10-17 DIAGNOSIS — R928 Other abnormal and inconclusive findings on diagnostic imaging of breast: Secondary | ICD-10-CM

## 2020-10-22 ENCOUNTER — Ambulatory Visit (INDEPENDENT_AMBULATORY_CARE_PROVIDER_SITE_OTHER): Payer: Medicare Other | Admitting: Clinical

## 2020-10-22 ENCOUNTER — Other Ambulatory Visit: Payer: Self-pay

## 2020-10-22 DIAGNOSIS — F4323 Adjustment disorder with mixed anxiety and depressed mood: Secondary | ICD-10-CM

## 2020-10-23 NOTE — BH Specialist Note (Signed)
Integrated Behavioral Health Follow Up In-Person Visit  MRN: [id number] Name: Vickie Pearson  Number of [place] Clinician visits: 2/6 Session Start time: 11:05am  Session End time: 11:55am Total time: 50  minutes  Types of Service: Individual psychotherapy  Interpretor:No. Interpretor Name and Language: N/A  Subjective: Vickie Pearson is a 65 y.o. female accompanied by  self Patient was referred by PCP Wynetta Emery for grief, depression, and anxiety. Patient reports the following symptoms/concerns: Reports feeling depressed at times, difficulty sleeping, decreased energy, appetite changes, difficulty concentrating, anxiousness, excessive worrying, and difficulty relaxing. Reports experiencing racing thoughts. Reports irritability and sadness coming when she sees people talking negatively about her brother. Reports that she is trying to decrease her time on the Internet/social media but finds it difficult due her brother's death being unsolved. Reports that the investigation continues and she has not received an autopsy report. Reports that she still wonders if her brother was killed. Reports that she continues to feel lonely at times.  Duration of problem: 1.5 months; Severity of problem: moderate  Objective: Mood: Anxious and Depressed and Affect: Appropriate and Tearful Risk of harm to self or others: No plan to harm self or others  Life Context: Family and Social: Reports that she lives alone. Reports her support system as friends and her daughter. Reports that majority of her family does not live in [place]. Reports that her husband is deceased.  School/Work: Pt is retired.  Self-Care: Reports prayer, church, and spending time with friends as coping skills. No updates on pt's medication adherence. Pt's medication is currently managed by PCP and was prescribed hydroxyzine on 10/15/20 due to difficulty adhering to Zoloft. Life Changes: Reports that her  brother passed away 1.5 months ago and she believes he was killed. Reports that he was her only relative that lived in [place]. Reports that she came to her brother's home and found him dead. Reports that she did not directly see her brother's body but saw the scene in which he died. Reports difficulty with accepting the loss of her brother and wanting "closure'. Reports that there is an open investigation on her brother's death.   Patient and/or Family's Strengths/Protective Factors: Social and Emotional competence and Concrete supports in place (healthy food, safe environments, etc.)  Goals Addressed: Patient will:  Reduce symptoms of: anxiety and depression   Increase knowledge and/or ability of: coping skills   Demonstrate ability to: Begin healthy grieving over loss  Progress towards Goals: Ongoing  Interventions: Interventions utilized:  Mindfulness or Relaxation Training and CBT Cognitive Behavioral Therapy Standardized Assessments completed: GAD-7 and PHQ 9 [place] from 10/22/2020 in [facility] at [facility]  PHQ-9 Total Score 5       GAD 7 : Generalized Anxiety Score 10/22/2020 10/07/2020 04/21/2019 10/01/2016  Nervous, Anxious, on Edge '1 1 1 '$ 0  Control/stop worrying '1 1 1 '$ 0  Worry too much - different things 1 1 0 0  Trouble relaxing '1 1 1 1  '$ Restless 0 1 0 0  Easily annoyed or irritable 0 0 0 0  Afraid - awful might happen 0 0 0 0  Total GAD 7 Score '4 5 3 1     '$ Patient and/or Family Response: Pt receptive to tx. Pt receptive to psychoeducation provided on the stages of grief. Pt receptive to cognitive restructuring utilized to decrease worries. Pt engaged in automatic thought record in order to identify triggers. Pt receptive to continuing thought record in brief tx.  Pt receptive to utilizing relaxation techniques (deep breathing, etc.), grief journalling, and decreasing time on social media.  Patient Centered Plan: Patient is on  the following Treatment Plan(s): Grief, Depression, and Anxiety  Assessment: Denies SI/HI. Denies auditory/visual hallucinations. No safety risks. Patient currently experiencing depression and anxiety an adjustment reaction to the loss of her brother. Pt continues to have difficulty processing the loss of her brother. Pt is continuing to wait on autopsy report which contributes to her stress. Pt continues to read information on the Internet/social media related to her brother's life and death. Pt appears to be triggered by reading things about his death and when people talk negatively about him. Pt continues to have strengths and coping skills that assist with grieving.  Patient may benefit from continuing to engage in an automatic thought record to assist with pt's grief. Pt would also benefit from grief journalling to improve pt's cognitive processing. LCSWA provided psychoeducation on the stages of grief and assisted pt in identifying her triggers with losing her brother. LCSWA encouraged pt to continue healthy coping skills and deep breathing exercises. LCSWA attempted to normalize pt's difficulty with processing her brother's death and encouraged her to decrease her time on the Internet/social media. No update on pt's progress with medication adherence. LCSWA will fu with pt..  Plan: Follow up with behavioral health clinician on : 11/12/20 Behavioral recommendations: Utilize deep breathing exercises, put phone down one hour prior to going to sleep and log off of social media when feeling triggered, continue healthy coping skills via watching favorite sitcom and listening to [employer], and utilize grief journal. Referral(s): [place] (In Clinic) "From scale of 1-10, how likely are you to follow plan?": 10  Vickie Guynes C Stefan Markarian, LCSW

## 2020-10-31 ENCOUNTER — Telehealth: Payer: Self-pay | Admitting: Nutrition

## 2020-10-31 NOTE — Telephone Encounter (Signed)
LVM that I am calling to schedule reclast infusion.  Gave her my number to call on Monday to set up the appointment.

## 2020-10-31 NOTE — Telephone Encounter (Signed)
LVM that I am calling to schedule reclast infusion

## 2020-11-04 ENCOUNTER — Other Ambulatory Visit: Payer: Self-pay

## 2020-11-04 ENCOUNTER — Ambulatory Visit
Admission: RE | Admit: 2020-11-04 | Discharge: 2020-11-04 | Disposition: A | Discharge status: Home / Self Care | Payer: Medicare Other | Source: Ambulatory Visit | Attending: Internal Medicine | Admitting: Internal Medicine

## 2020-11-04 ENCOUNTER — Other Ambulatory Visit: Payer: Self-pay | Admitting: Internal Medicine

## 2020-11-04 DIAGNOSIS — R928 Other abnormal and inconclusive findings on diagnostic imaging of breast: Secondary | ICD-10-CM

## 2020-11-05 ENCOUNTER — Telehealth: Payer: Self-pay | Admitting: Nutrition

## 2020-11-12 ENCOUNTER — Other Ambulatory Visit: Payer: Self-pay

## 2020-11-12 ENCOUNTER — Ambulatory Visit (INDEPENDENT_AMBULATORY_CARE_PROVIDER_SITE_OTHER): Payer: Medicare Other | Admitting: Clinical

## 2020-11-12 DIAGNOSIS — F4323 Adjustment disorder with mixed anxiety and depressed mood: Secondary | ICD-10-CM

## 2020-11-13 ENCOUNTER — Ambulatory Visit (INDEPENDENT_AMBULATORY_CARE_PROVIDER_SITE_OTHER): Payer: Medicare Other | Admitting: Nutrition

## 2020-11-13 DIAGNOSIS — M810 Age-related osteoporosis without current pathological fracture: Secondary | ICD-10-CM | POA: Diagnosis not present

## 2020-11-13 NOTE — Patient Instructions (Signed)
Drink 4-6 glass of water today Continue to take your calcium and Vit. D per Dr. Cordelia Pen order

## 2020-11-13 NOTE — Progress Notes (Signed)
Per Dr. Cordelia Pen note on 10/02/20, and after the patient signed the consent, and IV was started in patient left arm.  2ccs of normal saline was infused, and the site showed no signs of infiltration.  5 mg. Of zolendronic acid was started at 11:15AM and infused until 11:35.  The patient reported no discomfort at the site or dizziness during the infusion.  The IV was D/ced at 11:35. Site showed no signs of redness or swelling.  Pt. Was encouraged to drink 4-6 glasses of water today and to continue to take her Calcium and Vit. D per Dr. Cordelia Pen orders.

## 2020-11-14 IMAGING — CR DG BONE SURVEY MET
9 of 10 series · 9 of 10 positions shown · non-contrast
Comparison: 11/24/2016.

CLINICAL DATA: History of MGUS.

EXAM:
METASTATIC BONE SURVEY

[w chest pa]
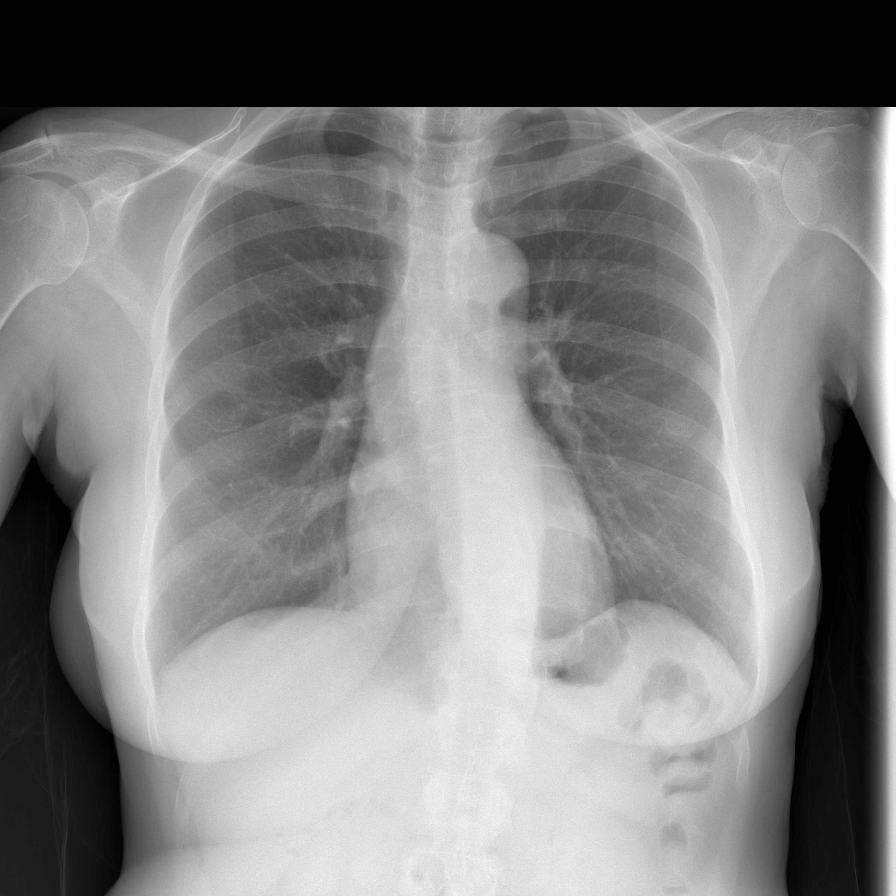

[w c-spine lat]
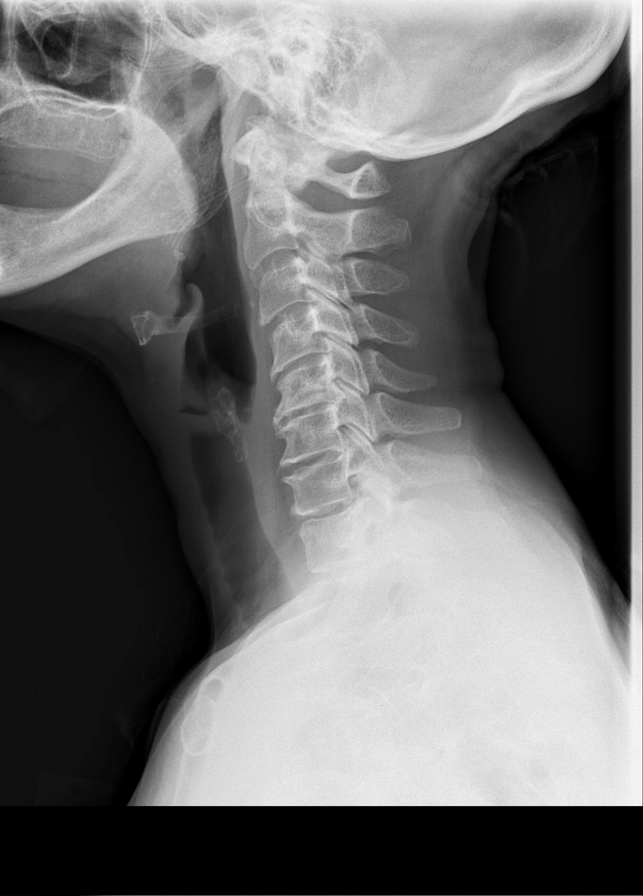

[w skull lat *]
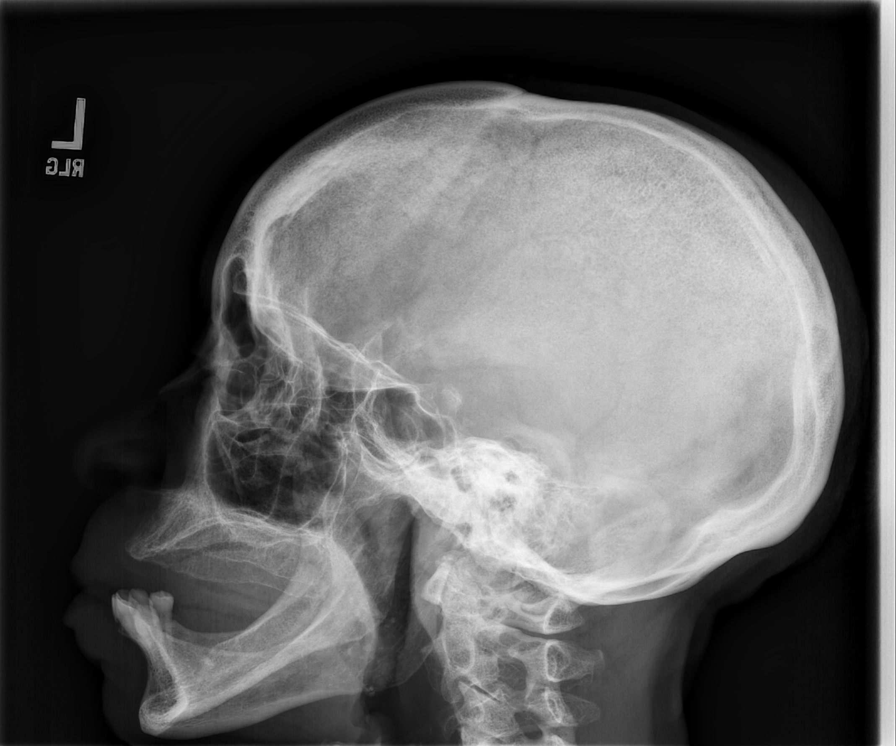

[w c-spine a.p. *]
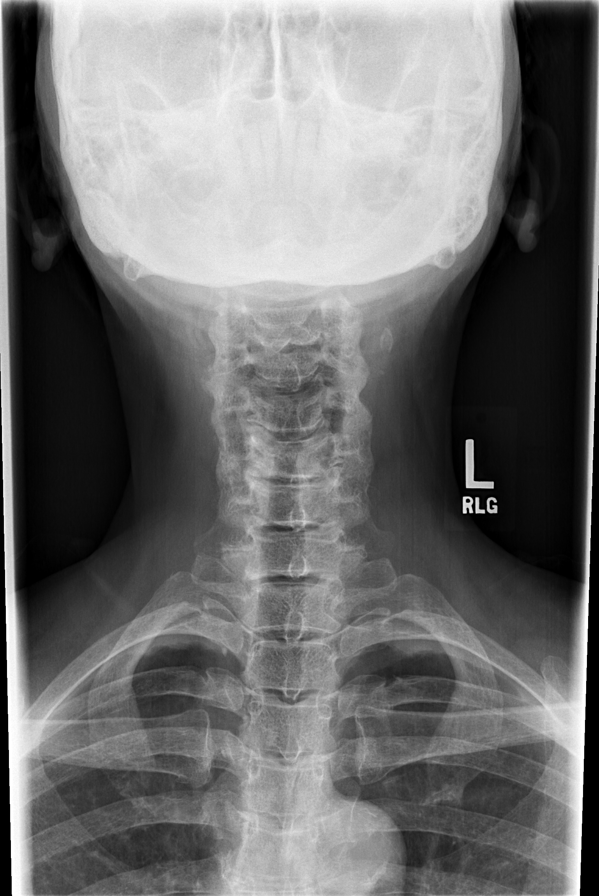

[w shoulder ap external left *]
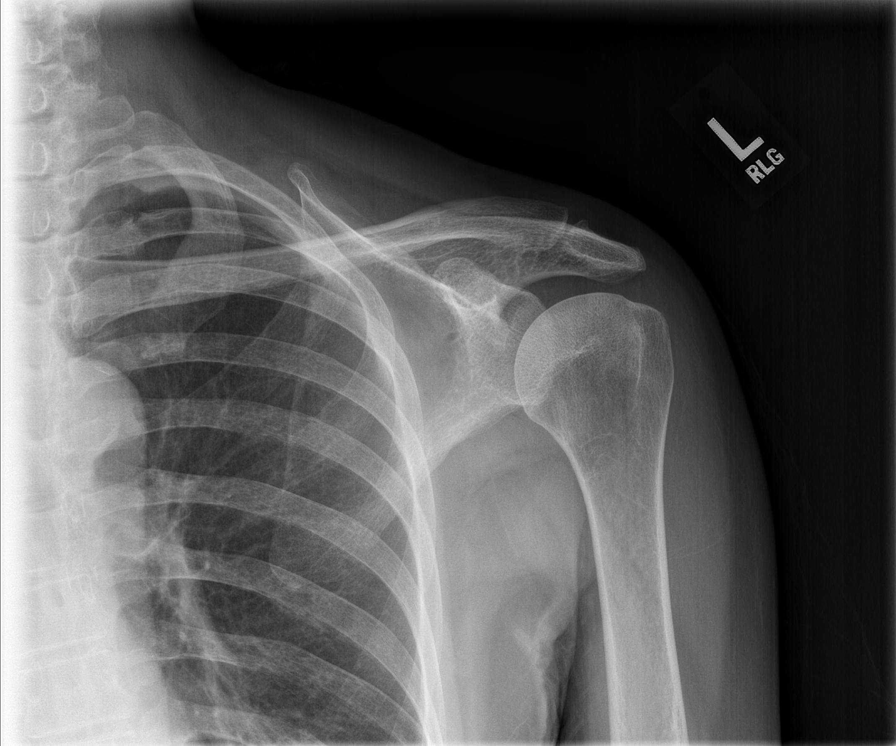

[w shoulder ap external right *]
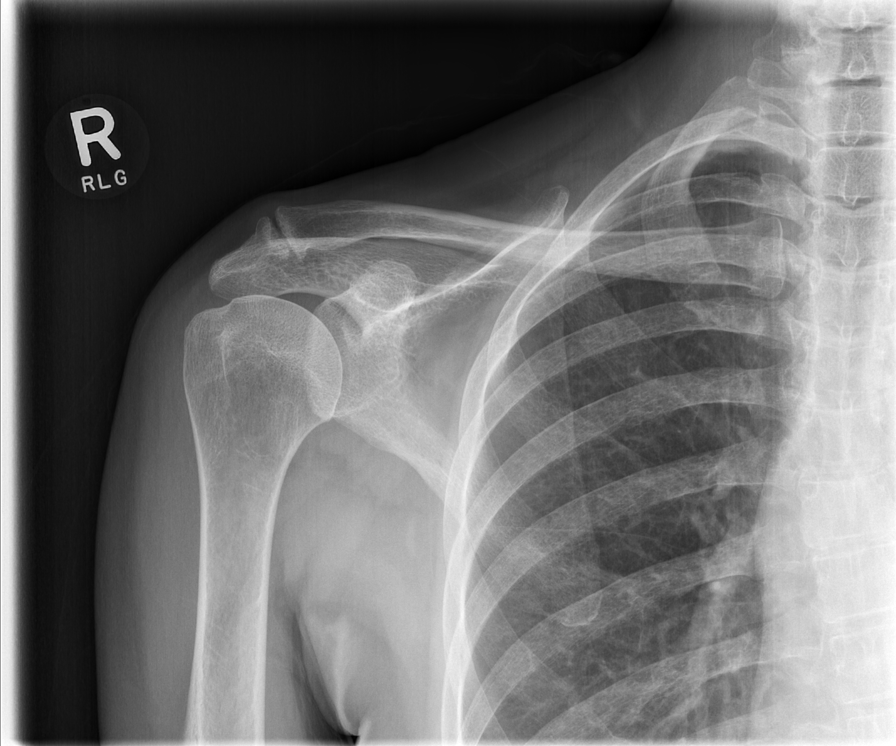

[w humerus ap right]
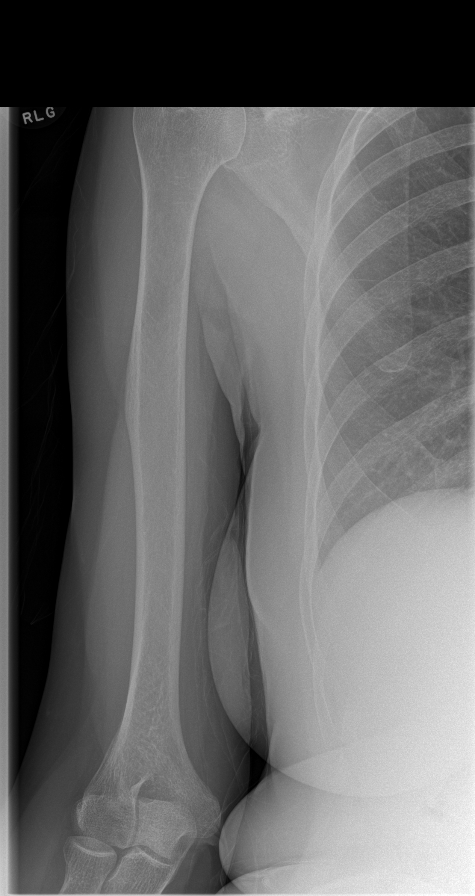

[w humerus ap left]
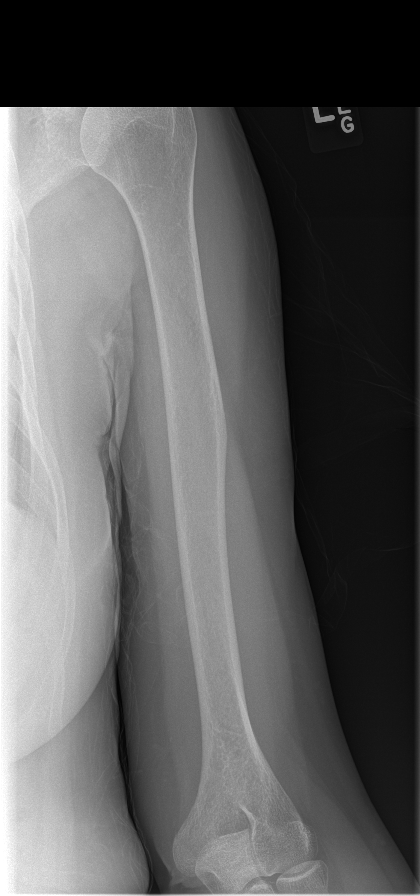

[x forearm ap right]
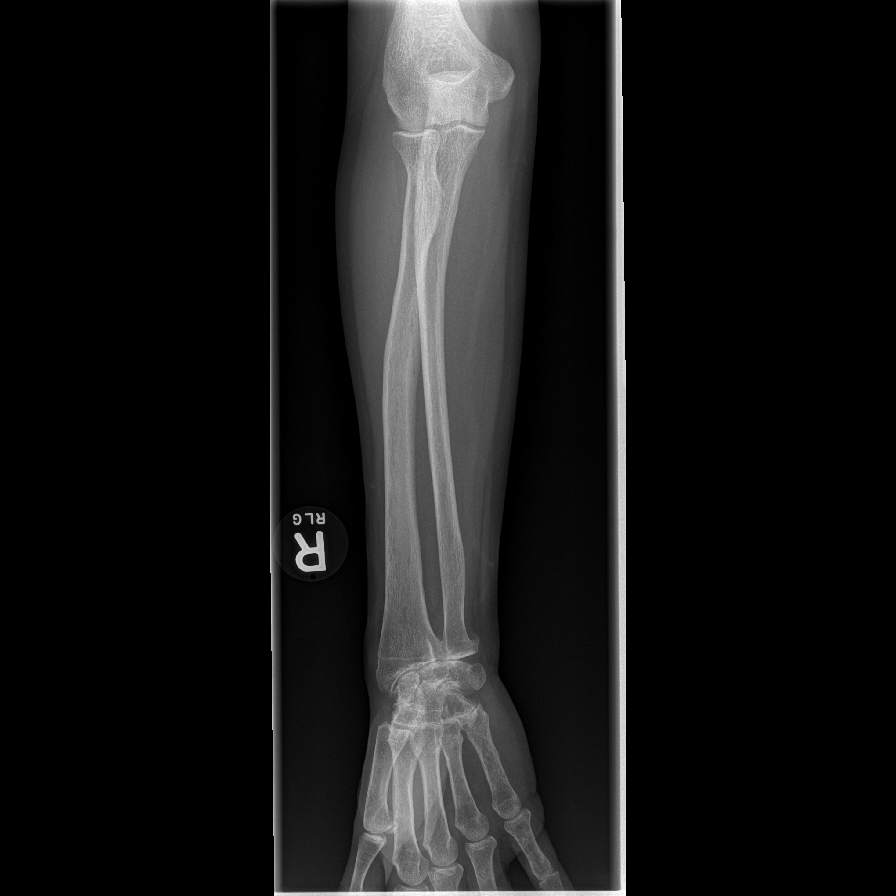

[9 of 10 positions shown; findings below may reference images not displayed]

FINDINGS: Standard imaging of the axial and appendicular skeleton performed.
Diffuse degenerative changes again noted about the axial and
appendicular skeleton. No lytic or sclerotic lesions are identified.
Aortoiliac and carotid vascular calcification noted.
IMPRESSION: 1.  No lytic or sclerotic lesions are identified.

2. Diffuse degenerative changes again noted about the axial and
appendicular skeleton.

3.  Aortoiliac and carotid vascular disease.

## 2020-11-15 ENCOUNTER — Ambulatory Visit
Admission: RE | Admit: 2020-11-15 | Discharge: 2020-11-15 | Disposition: A | Discharge status: Home / Self Care | Payer: Medicare Other | Source: Ambulatory Visit | Attending: Internal Medicine | Admitting: Internal Medicine

## 2020-11-15 ENCOUNTER — Other Ambulatory Visit: Payer: Self-pay

## 2020-11-15 ENCOUNTER — Other Ambulatory Visit (HOSPITAL_COMMUNITY): Payer: Self-pay | Admitting: Diagnostic Radiology

## 2020-11-15 DIAGNOSIS — R928 Other abnormal and inconclusive findings on diagnostic imaging of breast: Secondary | ICD-10-CM

## 2020-11-19 NOTE — BH Specialist Note (Signed)
Integrated Behavioral Health Follow Up In-Person Visit  MRN: [id number] Name: Vickie Pearson  Number of [place] Clinician visits: 3/6 Session Start time: 10:00am  Session End time: 10:30am Total time: 30 minutes  Types of Service: Individual psychotherapy  Interpretor:No. Interpretor Name and Language: N/A  Subjective: Vickie Pearson is a 65 y.o. female accompanied by  self Patient was referred by PCP Karle Plumber, MD for grief , depression, and anxiety. Patient reports the following symptoms/concerns: Reports feeling depressed at times, trouble sleeping, appetiite changes, anxiousness, excessive worrying, and racing thoughts. Reports that she continues to miss her brother and have questions surrounding his death.  Duration of problem: 2 months; Severity of problem: moderate  Objective: Mood: Anxious and Depressed and Affect: Appropriate Risk of harm to self or others: No plan to harm self or others  Life Context: Family and Social: Reports that she lives alone. Reports her support system as friends and her daughter. Reports that majority of her family does not live in [place]. Reports that her husband is deceased.  School/Work: Pt is retired.  Self-Care: Reports prayer, church, and spending time with friends as coping skills.  Life Changes: Reports that her brother passed away 2 months ago and she believes he was killed. Reports that he was her only relative that lived in [place]. Reports that she came to her brother's home and found him dead. Reports that she did not directly see her brother's body but saw the scene in which he died. Reports difficulty with accepting the loss of her brother and wanting "closure'. Reports that there is an open investigation on her brother's death.   Patient and/or Family's Strengths/Protective Factors: Social and Emotional competence and Concrete supports in place (healthy food, safe environments,  etc.)  Goals Addressed: Patient will:  Reduce symptoms of: anxiety and depression   Increase knowledge and/or ability of: coping skills   Demonstrate ability to: Begin healthy grieving over loss  Progress towards Goals: Ongoing  Interventions: Interventions utilized:  Mindfulness or Relaxation Training, CBT Cognitive Behavioral Therapy, and Psychoeducation and/or Health Education Standardized Assessments completed: GAD-7 and PHQ 9 Depression screen [facility] 2/9 11/12/2020 10/22/2020 10/11/2020 10/07/2020 04/21/2019  Decreased Interest 1 0 0 1 0  Down, Depressed, Hopeless 0 0 0 1 0  PHQ - 2 Score 1 0 0 2 0  Altered sleeping 1 2 - 2 -  Tired, decreased energy 1 1 - 0 -  Change in appetite 1 1 - 1 -  Feeling bad or failure about yourself  0 0 - 1 -  Trouble concentrating 0 1 - 0 -  Moving slowly or fidgety/restless 0 0 - 0 -  Suicidal thoughts 0 0 - 0 -  PHQ-9 Score 4 5 - 6 -    Patient and/or Family Response: Pt receptive to tx. Pt receptive to psychoeducation provided on grief. Pt receptive to automatic thought record utilized to assist pt with her thoughts and emotions. Pt receptive to continuing healthy coping skills, deep breathing exercises, and grief journaling.   Patient Centered Plan: Patient is on the following Treatment Plan(s): Grief, depression, and anxiety  Assessment: Denies SI/HI. Denies auditory/visual hallucinations. No safety risks. Patient currently experiencing an adjustment reaction with depression and anxiety. Pt's depression appears to be improving as she is continuing healthy coping skills. Pt was able to continue discussing the death of her brother and processing her thoughts.   Patient may benefit from continuing an automatic thought record. LCSWA provided psychoeducation on grief. LCSWA  encouraged pt to continue healthy coping skills (deep breathing exercises, listening to gospel music, watching television). LCSWA will fu with pt.  Plan: Follow up with behavioral  health clinician on : 11/26/20 Behavioral recommendations: Continue healthy coping skills and grief journalling  Referral(s): [place] (In Clinic) "From scale of 1-10, how likely are you to follow plan?": 10  Hisako Bugh C Amr [place], LCSW

## 2020-11-19 NOTE — Progress Notes (Signed)
Due to  Office Visit Note  Patient: Vickie Pearson             Date of Birth: [birth year]           MRN: [id number]             PCP: Ladell Pier, MD Referring: Ladell Pier, MD Visit Date: 12/03/2020 Occupation: @GUAROCC @  Subjective:  Pain in multiple joints.   History of Present Illness: Vickie Pearson is a 65 y.o. female with seronegative rheumatoid arthritis, osteoarthritis and degenerative disc disease.  She returns today after her last visit in May 2022.  Severe inflammatory arthritis she was prescribed leflunomide at the last visit.  She states she did not start the medication.  She continues to have pain and discomfort in multiple joints including her bilateral knee joints.  She states she was also having lower back pain radiating to her right lower extremity.  She went to physical therapy which helped.  She continues to have discomfort in the left shoulder and off-and-on discomfort in her feet.  Her hands are doing better.  She denies any recent episodes of iritis.  She does not recall when her last iritis episode was.  Activities of Daily Living:  Patient reports morning stiffness for 15-20 minutes.   Patient Reports nocturnal pain.  Difficulty dressing/grooming: Denies Difficulty climbing stairs: Reports Difficulty getting out of chair: Reports Difficulty using hands for taps, buttons, cutlery, and/or writing: Denies  Review of Systems  Constitutional:  Negative for fatigue.  HENT:  Negative for mouth sores, mouth dryness and nose dryness.   Eyes:  Positive for dryness. Negative for pain and itching.  Respiratory:  Negative for shortness of breath and difficulty breathing.   Cardiovascular:  Negative for chest pain and palpitations.  Gastrointestinal:  Negative for blood in stool, constipation and diarrhea.  Endocrine: Negative for increased urination.  Genitourinary:  Negative for difficulty urinating.  Musculoskeletal:  Positive for joint  pain, joint pain, joint swelling, myalgias, morning stiffness and myalgias.  Skin:  Negative for color change, rash and redness.  Allergic/Immunologic: Negative for susceptible to infections.  Neurological:  Positive for dizziness and headaches. Negative for numbness, memory loss and weakness.  Hematological:  Positive for bruising/bleeding tendency.  Psychiatric/Behavioral:  Negative for confusion.    PMFS History:  Patient Active Problem List   Diagnosis Date Noted   Immunosuppression ([place]) 04/21/2019   Coronary artery calcification of native artery 02/21/2019   Inappropriate sinus tachycardia 02/21/2019   Other chest pain 03/01/2017   Rheumatoid arthritis involving multiple sites ([place]) 07/24/2016   Cervical radiculopathy 07/24/2016   Familial hyperlipidemia 07/24/2016   Osteoporosis 07/24/2016   Vitamin D deficiency 07/24/2016   Multiple thyroid nodules 07/24/2016   MGUS (monoclonal gammopathy of unknown significance) 07/24/2016   Colon polyps 07/24/2016   Chronic iritis of both eyes 07/24/2016   Hx of abnormal cervical Pap smear 07/24/2016   OA (osteoarthritis) of knee 07/24/2016    Past Medical History:  Diagnosis Date   Colon polyps    Coronary artery calcification of native artery 02/21/2019   Minimal calcification noted 11/2018 on coronary CTA   Costochondritis    DDD (degenerative disc disease), cervical    Degenerative arthritis of knee    Bilateral   Degenerative disc disease, cervical    Degenerative disc disease, cervical    Fibromyalgia    GERD (gastroesophageal reflux disease)    Hyperlipidemia    Inappropriate sinus tachycardia 02/21/2019   Monoclonal gammopathy  Multiple thyroid nodules    Osteoporosis    Rheumatoid arthritis ([place])     Family History  Problem Relation Age of Onset   Hypertension Mother    Colon polyps Mother    Stroke Mother    Arthritis Mother    Hypertension Father    Heart disease Father    Arthritis Father    Kidney disease  Father    Hypertension Sister    Hyperlipidemia Sister    Hypertension Brother    Hyperlipidemia Brother    Esophageal cancer Neg Hx    Rectal cancer Neg Hx    Stomach cancer Neg Hx    Osteoporosis Neg Hx    Past Surgical History:  Procedure Laterality Date   BREAST BIOPSY Right 2022   COLONOSCOPY  2012   COLONOSCOPY  2015   POLYPECTOMY     Social History   Social History Narrative   Not on file   Immunization History  Administered Date(s) Administered   Fluad Quad(high Dose 65+) 02/21/2020   Influenza, High Dose Seasonal PF 11/10/2018   Influenza, Quadrivalent, Recombinant, Inj, Pf 11/15/2017   Influenza,inj,Quad PF,6+ Mos 11/09/2016, 11/10/2018   PFIZER(Purple Top)SARS-COV-2 Vaccination 03/17/2019, 04/07/2019, 11/17/2019     Objective: Vital Signs: BP 120/83 (BP Location: Left Arm, Patient Position: Sitting, Cuff Size: Normal)   Pulse 67   Ht 5\' 7"  (1.702 m)   Wt 152 lb 9.6 oz (69.2 kg)   BMI 23.90 kg/m    Physical Exam Vitals and nursing note reviewed.  Constitutional:      Appearance: She is well-developed.  HENT:     Head: Normocephalic and atraumatic.  Eyes:     Conjunctiva/sclera: Conjunctivae normal.  Cardiovascular:     Rate and Rhythm: Normal rate and regular rhythm.     Heart sounds: Normal heart sounds.  Pulmonary:     Effort: Pulmonary effort is normal.     Breath sounds: Normal breath sounds.  Abdominal:     General: Bowel sounds are normal.     Palpations: Abdomen is soft.  Musculoskeletal:     Cervical back: Normal range of motion.  Lymphadenopathy:     Cervical: No cervical adenopathy.  Skin:    General: Skin is warm and dry.     Capillary Refill: Capillary refill takes less than 2 seconds.  Neurological:     Mental Status: She is alert and oriented to person, place, and time.  Psychiatric:        Behavior: Behavior normal.     Musculoskeletal Exam: C-spine was in good range of motion.  Shoulder joints and elbow joints with good  range of motion.  She had limited range of motion of her wrist joints with synovitis bilaterally.  She had bilateral PIP and DIP thickening.  MCP thickening with no synovitis was noted.  Hip joints were in the mid range of motion without discomfort.  She had incomplete extension of bilateral knee joints without any warmth swelling or effusion.  There was no tenderness over ankles or MTPs.  CDAI Exam: CDAI Score: 10  Patient Global: 5 mm; Provider Global: 5 mm Swollen: 3 ; Tender: 6  Joint Exam 12/03/2020      Right  Left  Glenohumeral      Tender  Wrist  Swollen Tender  Swollen Tender  MCP 2  Swollen Tender     Knee   Tender   Tender     Investigation: No additional findings.  Imaging: US BREAST LTD UNI RIGHT INC  AXILLA  Result Date: 11/04/2020 CLINICAL DATA:  Recall from screening mammography, possible architectural distortion involving the outer RIGHT breast at middle to posterior depth. EXAM: DIGITAL DIAGNOSTIC UNILATERAL RIGHT MAMMOGRAM WITH TOMOSYNTHESIS AND CAD; ULTRASOUND RIGHT BREAST LIMITED TECHNIQUE: Right digital diagnostic mammography and breast tomosynthesis was performed. The images were evaluated with computer-aided detection.; Targeted ultrasound examination of the right breast was performed. COMPARISON:  Previous exam(s). ACR Breast Density Category b: There are scattered areas of fibroglandular density. FINDINGS: Spot-compression CC and MLO views of the area of concern and a full field mediolateral view were obtained. There is a focal asymmetry in the upper outer breast at middle to posterior depth, most conspicuous on the spot compression CC view, associated with possible subtle architectural distortion at its anterior margin the focal asymmetry and subtle distortion are not visible on the spot compression MLO view, though are present on the full field mediolateral view. Targeted ultrasound is performed, demonstrating focal duct ectasia at the 9:30 o'clock position  approximately 4 cm from the nipple, though there are no visible intraductal masses in there is no intraductal power Doppler flow. There is no sonographic correlate for the mammographic architectural distortion at the anterior margin of the dilated ducts. IMPRESSION: Isolated duct ectasia with associated subtle architectural distortion in the outer RIGHT breast at middle to posterior depth. The subtle distortion is at the anterior margin of the duct ectasia, and there is no sonographic correlate for the distortion. RECOMMENDATION: Stereotactic tomosynthesis core needle biopsy of the distortion at the anterior margin of the duct ectasia in the outer RIGHT breast. The stereotactic tomosynthesis core needle biopsy procedure was discussed with the patient and her questions were answered. She wishes to proceed and the biopsy has been scheduled by the [facility] staff. I have discussed the findings and recommendations with the patient. BI-RADS CATEGORY  4: Suspicious. Electronically Signed   By: Evangeline Dakin M.D.   On: 11/04/2020 12:09  MM DIAG BREAST TOMO UNI RIGHT  Result Date: 11/04/2020 CLINICAL DATA:  Recall from screening mammography, possible architectural distortion involving the outer RIGHT breast at middle to posterior depth. EXAM: DIGITAL DIAGNOSTIC UNILATERAL RIGHT MAMMOGRAM WITH TOMOSYNTHESIS AND CAD; ULTRASOUND RIGHT BREAST LIMITED TECHNIQUE: Right digital diagnostic mammography and breast tomosynthesis was performed. The images were evaluated with computer-aided detection.; Targeted ultrasound examination of the right breast was performed. COMPARISON:  Previous exam(s). ACR Breast Density Category b: There are scattered areas of fibroglandular density. FINDINGS: Spot-compression CC and MLO views of the area of concern and a full field mediolateral view were obtained. There is a focal asymmetry in the upper outer breast at middle to posterior depth, most conspicuous on the spot compression CC view,  associated with possible subtle architectural distortion at its anterior margin the focal asymmetry and subtle distortion are not visible on the spot compression MLO view, though are present on the full field mediolateral view. Targeted ultrasound is performed, demonstrating focal duct ectasia at the 9:30 o'clock position approximately 4 cm from the nipple, though there are no visible intraductal masses in there is no intraductal power Doppler flow. There is no sonographic correlate for the mammographic architectural distortion at the anterior margin of the dilated ducts. IMPRESSION: Isolated duct ectasia with associated subtle architectural distortion in the outer RIGHT breast at middle to posterior depth. The subtle distortion is at the anterior margin of the duct ectasia, and there is no sonographic correlate for the distortion. RECOMMENDATION: Stereotactic tomosynthesis core needle biopsy of the distortion at the  anterior margin of the duct ectasia in the outer RIGHT breast. The stereotactic tomosynthesis core needle biopsy procedure was discussed with the patient and her questions were answered. She wishes to proceed and the biopsy has been scheduled by the [facility] staff. I have discussed the findings and recommendations with the patient. BI-RADS CATEGORY  4: Suspicious. Electronically Signed   By: Evangeline Dakin M.D.   On: 11/04/2020 12:09  MM CLIP PLACEMENT RIGHT  Result Date: 11/15/2020 CLINICAL DATA:  Evaluate post biopsy marker clip placement following stereotactic core needle biopsy of a subtle right breast architectural distortion. EXAM: 3D DIAGNOSTIC RIGHT MAMMOGRAM POST STEREOTACTIC BIOPSY COMPARISON:  Previous exam(s). FINDINGS: 3D Mammographic images were obtained following stereotactic guided biopsy of a subtle lateral right breast architectural distortion. The biopsy marking clip is in expected position at the site of biopsy. IMPRESSION: Appropriate positioning of the X shaped biopsy  marking clip at the site of biopsy in the lateral right breast, in the expected location of the subtle architectural distortion. Final Assessment: Post Procedure Mammograms for Marker Placement Electronically Signed   By: Lajean Manes M.D.   On: 11/15/2020 10:30  MM RT BREAST BX W LOC DEV 1ST LESION IMAGE BX SPEC STEREO GUIDE  Addendum Date: 11/19/2020   ADDENDUM REPORT: 11/19/2020 15:49 ADDENDUM: Pathology revealed SCLEROTIC FIBROADENOMATOID CHANGES- NO MALIGNANCY IDENTIFIED of the RIGHT breast, lateral, lower outer quadrant. This was found to be concordant by Dr. Lajean Manes. Pathology results were discussed with the patient by telephone. The patient reported doing well after the biopsy with tenderness at the site. Post biopsy instructions and care were reviewed and questions were answered. The patient was encouraged to call [facility] for any additional concerns. The patient was instructed to return for annual screening mammography and informed a reminder notice would be sent regarding this appointment. Pathology results reported by Stacie Acres RN on 11/19/2020. Electronically Signed   By: Lajean Manes M.D.   On: 11/19/2020 15:49   Result Date: 11/19/2020 CLINICAL DATA:  Patient presents for stereotactic core needle biopsy of a architectural distortion in the lateral right breast. EXAM: RIGHT BREAST STEREOTACTIC CORE NEEDLE BIOPSY COMPARISON:  Previous exams. FINDINGS: The patient and I discussed the procedure of stereotactic-guided biopsy including benefits and alternatives. We discussed the high likelihood of a successful procedure. We discussed the risks of the procedure including infection, bleeding, tissue injury, clip migration, and inadequate sampling. Informed written consent was given. The usual time out protocol was performed immediately prior to the procedure. Using sterile technique and 1% Lidocaine as local anesthetic, under stereotactic guidance, a 9 gauge  vacuum assisted device was used to perform core needle biopsy of area of apparent architectural distortion in the lateral right breast using a superior approach. Lesion quadrant: Lower outer quadrant At the conclusion of the procedure, an X shaped tissue marker clip was deployed into the biopsy cavity. Follow-up 2-view mammogram was performed and dictated separately. IMPRESSION: Stereotactic-guided biopsy of an apparent, subtle architectural distortion in the lateral right breast. No apparent complications. Electronically Signed: By: Lajean Manes M.D. On: 11/15/2020 10:19   Recent Labs: Lab Results  Component Value Date   WBC 5.8 07/02/2020   HGB 13.7 07/02/2020   PLT 253 07/02/2020   NA 139 10/02/2020   K 3.9 10/02/2020   CL 105 10/02/2020   CO2 26 10/02/2020   GLUCOSE 90 10/02/2020   BUN 10 10/02/2020   CREATININE 0.89 10/02/2020   BILITOT 0.2 09/30/2020  ALKPHOS 85 09/30/2020   AST 15 09/30/2020   ALT 8 09/30/2020   PROT 6.9 09/30/2020   ALBUMIN 4.1 09/30/2020   CALCIUM 9.7 10/02/2020   CALCIUM 9.7 10/02/2020   GFRAA 78 07/02/2020    Speciality Comments: No specialty comments available.  Procedures:  Large Joint Inj: bilateral knee on 12/03/2020 11:15 AM Indications: pain Details: 27 G 1.5 in needle, medial approach  Arthrogram: No  Medications (Right): 1.5 mL lidocaine 1 %; 40 mg triamcinolone acetonide 40 MG/ML Medications (Left): 1.5 mL lidocaine 1 %; 40 mg triamcinolone acetonide 40 MG/ML Outcome: tolerated well, no immediate complications Procedure, treatment alternatives, risks and benefits explained, specific risks discussed. Consent was given by the patient. Immediately prior to procedure a time out was called to verify the correct patient, procedure, equipment, support staff and site/side marked as required. Patient was prepped and draped in the usual sterile fashion.    Allergies: Penicillins, Piroxicam, and Sulfa antibiotics   Assessment / Plan:     Visit  Diagnoses: Rheumatoid arthritis of multiple sites with negative rheumatoid factor (HCC)-she has seronegative rheumatoid arthritis involving multiple joints.  She was given a prescription for leflunomide in May 2022 which she did not start.  She states she continues to have intermittent pain and swelling in follow-up discomfort.  She wants to start on leflunomide now.  I will send a prescription for leflunomide 10 mg p.o. daily.  We will check labs in 2 weeks and then every 3 months to monitor for drug toxicity.  High risk medication use -she will start [place] 10mg  by mouth daily now.  - Plan: CBC with Differential/Platelet, COMPLETE METABOLIC PANEL WITH GFR in 2 weeks and then every 3 months.  She had recent CMP in August 2022 which was normal.  Chronic iritis of both eyes-patient denies any recent flare.  Chronic pain of both knees-she gives history of pain and discomfort in the bilateral knee joints.  She had limited extension.  No warmth swelling or effusion was noted.  Per patient's request bilateral knee joints were injected with cortisone as described above.  Primary osteoarthritis of both knees - Severe end-stage rheumatoid arthritis of both knees.  She will need total knee replacement eventually.  She is not prepared for it.  DDD (degenerative disc disease), cervical-chronic pain  DDD (degenerative disc disease), lumbar-chronic pain patient states she had recent flare with right-sided radiculopathy which improved after physical therapy.  Age-related osteoporosis without current pathological fracture - DEXA updated on 07/07/19: The BMD measured at Femur Neck Left is 0.624 g/cm2 with a T-score of-3.0.   Vitamin D deficiency  Monoclonal gammopathy - Followed by Dr. Lorenso Courier.   Multiple thyroid nodules  Mixed hyperlipidemia  Orders: Orders Placed This Encounter  Procedures   Large Joint Inj   CBC with Differential/Platelet   COMPLETE METABOLIC PANEL WITH GFR    Meds ordered this  encounter  Medications   leflunomide (ARAVA) 10 MG tablet    Sig: Take 1 tablet (10 mg total) by mouth daily.    Dispense:  30 tablet    Refill:  2      Follow-Up Instructions: Return in about 6 weeks (around 01/14/2021) for Rheumatoid arthritis.   Bo Merino, MD  Note - This record has been created using Editor, commissioning.  Chart creation errors have been sought, but may not always  have been located. Such creation errors do not reflect on  the standard of medical care.

## 2020-11-26 ENCOUNTER — Ambulatory Visit (INDEPENDENT_AMBULATORY_CARE_PROVIDER_SITE_OTHER): Payer: Medicare Other | Admitting: Clinical

## 2020-11-26 ENCOUNTER — Other Ambulatory Visit: Payer: Self-pay

## 2020-11-26 DIAGNOSIS — F4323 Adjustment disorder with mixed anxiety and depressed mood: Secondary | ICD-10-CM

## 2020-12-03 ENCOUNTER — Ambulatory Visit (INDEPENDENT_AMBULATORY_CARE_PROVIDER_SITE_OTHER): Payer: Medicare Other | Admitting: Rheumatology

## 2020-12-03 ENCOUNTER — Encounter: Payer: Self-pay | Admitting: Rheumatology

## 2020-12-03 ENCOUNTER — Other Ambulatory Visit: Payer: Self-pay

## 2020-12-03 VITALS — BP 120/83 | HR 67 | Ht 67.0 in | Wt 152.6 lb

## 2020-12-03 DIAGNOSIS — M810 Age-related osteoporosis without current pathological fracture: Secondary | ICD-10-CM

## 2020-12-03 DIAGNOSIS — M0609 Rheumatoid arthritis without rheumatoid factor, multiple sites: Secondary | ICD-10-CM | POA: Diagnosis not present

## 2020-12-03 DIAGNOSIS — G8929 Other chronic pain: Secondary | ICD-10-CM | POA: Diagnosis not present

## 2020-12-03 DIAGNOSIS — M5030 Other cervical disc degeneration, unspecified cervical region: Secondary | ICD-10-CM

## 2020-12-03 DIAGNOSIS — E782 Mixed hyperlipidemia: Secondary | ICD-10-CM

## 2020-12-03 DIAGNOSIS — M25561 Pain in right knee: Secondary | ICD-10-CM | POA: Diagnosis not present

## 2020-12-03 DIAGNOSIS — E042 Nontoxic multinodular goiter: Secondary | ICD-10-CM

## 2020-12-03 DIAGNOSIS — M5136 Other intervertebral disc degeneration, lumbar region: Secondary | ICD-10-CM

## 2020-12-03 DIAGNOSIS — H2013 Chronic iridocyclitis, bilateral: Secondary | ICD-10-CM

## 2020-12-03 DIAGNOSIS — M25562 Pain in left knee: Secondary | ICD-10-CM

## 2020-12-03 DIAGNOSIS — E559 Vitamin D deficiency, unspecified: Secondary | ICD-10-CM

## 2020-12-03 DIAGNOSIS — M170 Bilateral primary osteoarthritis of knee: Secondary | ICD-10-CM

## 2020-12-03 DIAGNOSIS — Z79899 Other long term (current) drug therapy: Secondary | ICD-10-CM

## 2020-12-03 DIAGNOSIS — D472 Monoclonal gammopathy: Secondary | ICD-10-CM

## 2020-12-03 MED ORDER — LIDOCAINE HCL 1 % IJ SOLN
1.5000 mL | INTRAMUSCULAR | Status: AC | PRN
Start: 2020-12-03 — End: 2020-12-03
  Administered 2020-12-03: 1.5 mL

## 2020-12-03 MED ORDER — LEFLUNOMIDE 10 MG PO TABS: 10.0000 mg | ORAL_TABLET | Freq: Every day | ORAL | 2 refills | Status: DC

## 2020-12-03 MED ORDER — TRIAMCINOLONE ACETONIDE 40 MG/ML IJ SUSP
40.0000 mg | INTRAMUSCULAR | Status: AC | PRN
  Administered 2020-12-03: 40 mg via INTRA_ARTICULAR

## 2020-12-03 MED ORDER — LIDOCAINE HCL 1 % IJ SOLN
1.5000 mL | INTRAMUSCULAR | Status: AC | PRN
  Administered 2020-12-03: 1.5 mL

## 2020-12-03 NOTE — Patient Instructions (Signed)
Standing Labs We placed an order today for your standing lab work.   Please have your standing labs drawn in 2 weeks and then every 3 months  If possible, please have your labs drawn 2 weeks prior to your appointment so that the provider can discuss your results at your appointment.  Please note that you may see your imaging and lab results in [facility] before we have reviewed them. We may be awaiting multiple results to interpret others before contacting you. Please allow our office up to 72 hours to thoroughly review all of the results before contacting the office for clarification of your results.  We have open lab daily: Monday through Thursday from 1:30-4:30 PM and Friday from 1:30-4:00 PM at the office of Dr. Bo Merino, [facility].   Please be advised, all patients with office appointments requiring lab work will take precedent over walk-in lab work.  If possible, please come for your lab work on Monday and Friday afternoons, as you may experience shorter wait times. The office is located at [address] [place], [place], [place] [address] No appointment is necessary.   Labs are drawn by [facility]. Please bring your co-pay at the time of your lab draw.  You may receive a bill from [facility] for your lab work.  If you wish to have your labs drawn at another location, please call the office 24 hours in advance to send orders.  If you have any questions regarding directions or hours of operation,  please call [phone].   As a reminder, please drink plenty of water prior to coming for your lab work. Thanks!   Vaccines You are taking a medication(s) that can suppress your immune system.  The following immunizations are recommended: Flu annually Covid-19  Td/Tdap (tetanus, diphtheria, pertussis) every 10 years Pneumonia (Prevnar 15 then Pneumovax 23 at least 1 year apart.  Alternatively, can take Prevnar 20 without needing additional dose) Shingrix: 2 doses from 4  weeks to 6 months apart  Please check with your PCP to make sure you are up to date.   If you have signs or symptoms of an infection or start antibiotics: First, call your PCP for workup of your infection. Hold your medication through the infection, until you complete your antibiotics, and until symptoms resolve if you take the following: Injectable medication (Actemra, Benlysta, Cimzia, Cosentyx, Enbrel, Humira, Kevzara, Orencia, Remicade, Simponi, Stelara, Taltz, Tremfya) Methotrexate Leflunomide (Arava) Mycophenolate (Cellcept) Morrie Sheldon, Olumiant, or Rinvoq  Heart Disease Prevention   Your inflammatory disease increases your risk of heart disease which includes heart attack, stroke, atrial fibrillation (irregular heartbeats), high blood pressure, heart failure and atherosclerosis (plaque in the arteries).  It is important to reduce your risk by:   Keep blood pressure, cholesterol, and blood sugar at healthy levels   Smoking Cessation   Maintain a healthy weight  BMI 20-25   Eat a healthy diet  Plenty of fresh fruit, vegetables, and whole grains  Limit saturated fats, foods high in sodium, and added sugars  DASH and Mediterranean diet   Increase physical activity  Recommend moderate physically activity for 150 minutes per week/ 30 minutes a day for five days a week These can be broken up into three separate ten-minute sessions during the day.   Reduce Stress  Meditation, slow breathing exercises, yoga, coloring books  Dental visits twice a year

## 2020-12-04 NOTE — BH Specialist Note (Signed)
Integrated Behavioral Health Follow Up In-Person Visit  MRN: [id number] Name: Vickie Pearson  Number of [place] Clinician visits: 4/6 Session Start time: 10:00am  Session End time: 10:30am Total time: 30 minutes  Types of Service: Individual psychotherapy  Interpretor:No. Interpretor Name and Language: N/A  Subjective: Vickie Pearson is a 65 y.o. female accompanied by  self Patient was referred by PCP Karle Plumber, MD for grief, depression, anxiety. Patient reports the following symptoms/concerns: Reports feeling depressed at times, trouble sleeping, decreased energy, anxiousness, and worrying. Pt continues to experience grief related to the loss of her brother.  Duration of problem: 3 months; Severity of problem: mild  Objective: Mood: Anxious and Depressed and Affect: Appropriate Risk of harm to self or others: No plan to harm self or others  Life Context: Family and Social: Reports that she lives alone. Reports her support system as friends and her daughter. Reports that majority of her family does not live in [place]. Reports that her husband is deceased.  School/Work: Pt is retired.  Self-Care: Reports prayer, church, and spending time with friends as coping skills.  Life Changes: Reports that her brother passed away 3 months ago and she believes he was killed. Reports that he was her only relative that lived in [place]. Reports that she came to her brother's home and found him dead. Reports that she did not directly see her brother's body but saw the scene in which he died. Reports difficulty with accepting the loss of her brother and wanting "closure'. Reports that there is an open investigation on her brother's death.  (No changes to life context)  Patient and/or Family's Strengths/Protective Factors: Social and Emotional competence and Concrete supports in place (healthy food, safe environments, etc.)  Goals Addressed: Patient  will:  Reduce symptoms of: anxiety and depression   Increase knowledge and/or ability of: coping skills   Demonstrate ability to: Begin healthy grieving over loss  Progress towards Goals: Ongoing  Interventions: Interventions utilized:  Mindfulness or Relaxation Training, CBT Cognitive Behavioral Therapy, and Supportive Counseling Standardized Assessments completed: GAD-7 and PHQ 9 GAD 7 : Generalized Anxiety Score 11/26/2020 11/19/2020 10/22/2020 10/07/2020  Nervous, Anxious, on Edge 1 1 1 1   Control/stop worrying 1 1 1 1   Worry too much - different things 1 1 1 1   Trouble relaxing 0 0 1 1  Restless 0 0 0 1  Easily annoyed or irritable 0 0 0 0  Afraid - awful might happen 0 0 0 0  Total GAD 7 Score 3 3 4 5      Depression screen [facility] 2/9 11/26/2020 11/12/2020 10/22/2020 10/11/2020 10/07/2020  Decreased Interest 0 1 0 0 1  Down, Depressed, Hopeless 0 0 0 0 1  PHQ - 2 Score 0 1 0 0 2  Altered sleeping 1 1 2  - 2  Tired, decreased energy 1 1 1  - 0  Change in appetite 1 1 1  - 1  Feeling bad or failure about yourself  0 0 0 - 1  Trouble concentrating 0 0 1 - 0  Moving slowly or fidgety/restless 0 0 0 - 0  Suicidal thoughts 0 0 0 - 0  PHQ-9 Score 3 4 5  - 6    Patient and/or Family Response: Pt receptive to tx. Pt receptive to psychoeducation on grief. Pt receptive to continuing automatic thought record to assist with thoughts and emotions. Pt receptive to continuing deep breathing exercises, listening to music, and grief journalling.   Patient Centered Plan: Patient  is on the following Treatment Plan(s): Grief, Depression ,and Anxiety  Assessment: Denies SI/HI. Denies auditory/visual hallucinations. No safety risks. Patient currently experiencing mild depression and anxiety related to the loss of her brother. Pt continues to process the loss of her brother and has not received an autopsy report. Pt has decreased the amount of time that she spends on social media as an effort to ignore  others thoughts about her brother. Pt continues to process her negative thoughts about the loss of her brother and is accepting that she may never receive closure..   Patient may benefit from continuing automatic thought record. LCSWA provided additional psychoeducation on grief. LCSWA encouraged pt to continue healthy coping skills (deep breathing, exercises, listening to music, journalling, and watching television). LCSWA will fu with pt.  Plan: Follow up with behavioral health clinician on : 12/10/20 Behavioral recommendations: Continue healthy coping skills Referral(s): [place] (In Clinic) "From scale of 1-10, how likely are you to follow plan?": 10  Markeria Goetsch C Honore Wipperfurth, LCSW

## 2020-12-10 ENCOUNTER — Other Ambulatory Visit: Payer: Self-pay

## 2020-12-10 ENCOUNTER — Ambulatory Visit (INDEPENDENT_AMBULATORY_CARE_PROVIDER_SITE_OTHER): Payer: Medicare Other | Admitting: Clinical

## 2020-12-10 DIAGNOSIS — F4323 Adjustment disorder with mixed anxiety and depressed mood: Secondary | ICD-10-CM

## 2020-12-12 NOTE — BH Specialist Note (Signed)
Integrated Behavioral Health Follow Up In-Person Visit  MRN: [id number] Name: Vickie Pearson  Number of [place] Clinician visits: 5/6 Session Start time: 10:04am  Session End time: 10:34am Total time: 30 minutes  Types of Service: Individual psychotherapy  Interpretor:No. Interpretor Name and Language: N/A  Subjective: Vickie Pearson is a 65 y.o. female accompanied by  self Patient was referred by PCP Karle Plumber, MD for grief, depression, and anxiety. Patient reports the following symptoms/concerns: Reports trouble sleeping, worrying, and feeling sad at times when thinking about her brother. Reports that she went to her brother's house and assisted his wife with going through his belongings. Reports excessive worrying due to still not receiving the autopsy report.  Duration of problem: 3 months; Severity of problem: mild  Objective: Mood: Anxious and Affect: Appropriate Risk of harm to self or others: No plan to harm self or others  Life Context: Family and Social: Reports that she lives alone. Reports her support system as friends and her daughter. Reports that majority of her family does not live in [place]. Reports that her husband is deceased.  School/Work: Pt is retired.  Self-Care: Reports prayer, church, and spending time with friends as coping skills.  Life Changes: Reports that her brother passed away 3 months ago and she believes he was killed. Reports that he was her only relative that lived in [place]. Reports that she came to her brother's home and found him dead. Reports that she did not directly see her brother's body but saw the scene in which he died. Reports difficulty with accepting the loss of her brother and wanting "closure'. Reports that there is an open investigation on her brother's death. Reports that she still has not received the autopsy report. Reports that she assisted his wife with going through his belongings  in his home.   Patient and/or Family's Strengths/Protective Factors: Social and Emotional competence and Concrete supports in place (healthy food, safe environments, etc.)  Goals Addressed: Patient will:  Reduce symptoms of: anxiety and depression   Increase knowledge and/or ability of: coping skills   Demonstrate ability to: Begin healthy grieving over loss  Progress towards Goals: Ongoing  Interventions: Interventions utilized:  Mindfulness or Psychologist, educational, CBT Cognitive Behavioral Therapy, Supportive Counseling, and Psychoeducation and/or Health Education Standardized Assessments completed: GAD-7 and PHQ 9 GAD 7 : Generalized Anxiety Score 12/10/2020 11/26/2020 11/19/2020 10/22/2020  Nervous, Anxious, on Edge 0 1 1 1   Control/stop worrying 1 1 1 1   Worry too much - different things 1 1 1 1   Trouble relaxing 0 0 0 1  Restless 0 0 0 0  Easily annoyed or irritable 0 0 0 0  Afraid - awful might happen 0 0 0 0  Total GAD 7 Score 2 3 3 4      Depression screen [facility] 2/9 12/10/2020 11/26/2020 11/12/2020 10/22/2020 10/11/2020  Decreased Interest 0 0 1 0 0  Down, Depressed, Hopeless 0 0 0 0 0  PHQ - 2 Score 0 0 1 0 0  Altered sleeping 1 1 1 2  -  Tired, decreased energy 0 1 1 1  -  Change in appetite 1 1 1 1  -  Feeling bad or failure about yourself  0 0 0 0 -  Trouble concentrating 0 0 0 1 -  Moving slowly or fidgety/restless 0 0 0 0 -  Suicidal thoughts 0 0 0 0 -  PHQ-9 Score 2 3 4 5  -    Patient and/or Family Response: Pt receptive  to tx. Pt receptive to psychoeducation on grief. Pt was able to rate her emotions on a scale of 0-100 as 70. Pt continue to listen to music, utilize deep breathing exercises, and journal. Pt was able to acknowledge her worries will still not having the autopsy report.  Patient Centered Plan: Patient is on the following Treatment Plan(s): Grief, Depression, and Anxiety  Assessment: Denies SI/HI. Denies auditory/visual hallucinations. No safety risks.  Patient currently experiencing mild depression and  anxiety with grief. Pt's mood appears to be improving as she continues healthy coping skills and processes her brother's death. Pt rates her emotions as 70 on scale of 0-100. Pt's mood appears to be improved when spending time with family. Pt appears to experience worry due to not receiving the autopsy report which pt acknowledges this preventing her emotions from being an 80.  Patient may benefit from discussing more about her brother's life as she appears to be effectively processing her brother's death. LCSWA provided psychoeducation on grief. LCSWA encouraged pt to continue healthy coping skills (deep breathing exercises, listening to music, journalling, and watching television. LCSWA will fu with pt.  Plan: Follow up with behavioral health clinician on : 12/31/20 Behavioral recommendations: Continue healthy coping skills Referral(s): [place] (In Clinic) "From scale of 1-10, how likely are you to follow plan?": 10  Gaberial Cada C Damesha Lawler, LCSW

## 2020-12-31 ENCOUNTER — Other Ambulatory Visit: Payer: Self-pay

## 2020-12-31 ENCOUNTER — Ambulatory Visit (INDEPENDENT_AMBULATORY_CARE_PROVIDER_SITE_OTHER): Payer: Medicare Other | Admitting: Clinical

## 2020-12-31 DIAGNOSIS — F4323 Adjustment disorder with mixed anxiety and depressed mood: Secondary | ICD-10-CM

## 2020-12-31 NOTE — BH Specialist Note (Signed)
Integrated Behavioral Health Follow Up In-Person Visit  MRN: [id number] Name: Vickie Pearson  Number of [place] Clinician visits: 6/6 Session Start time: 10:45am  Session End time: 11:15am Total time: 30 minutes  Types of Service: Individual psychotherapy  Interpretor:No. Interpretor Name and Language: N/A  Subjective: Vickie Pearson is a 65 y.o. female accompanied by  self Patient was referred by PCP Karle Plumber, MD for grief, depression, and anxiety. Patient reports the following symptoms/concerns: Reports trouble sleeping at times, anxiousness, and excessive worrying. Reports that she misses her brother and is feeling down due to having to be without her brother for thanksgiving. Reports that she plans to spend Thanksgiving alone due to her family not coming to [place]. Reports that her friend invited her over for Thanksgiving dinner but she is unsure about going due to having to be around someone else's family. Duration of problem: 4 months; Severity of problem: mild  Objective: Mood: Anxious and Affect: Appropriate Risk of harm to self or others: No plan to harm self or others  Life Context: Family and Social: Reports that she lives alone. Reports her support system as friends and her daughter. Reports that majority of her family does not live in [place]. Reports that her husband is deceased.  School/Work: Pt is retired.  Self-Care: Reports prayer, church, and spending time with friends as coping skills.  Life Changes: Reports that her brother passed away 3 months ago and she believes he was killed. Reports that he was her only relative that lived in [place]. Reports that she came to her brother's home and found him dead. Reports that she did not directly see her brother's body but saw the scene in which he died. Reports difficulty with accepting the loss of her brother and wanting "closure'. Reports that there is an open  investigation on her brother's death. Reports that she still has not received the autopsy report. Reports that she assisted his wife with going through his belongings in his home.  (No changes to life context)  Patient and/or Family's Strengths/Protective Factors: Social and Emotional competence and Concrete supports in place (healthy food, safe environments, etc.)  Goals Addressed: Patient will:  Reduce symptoms of: anxiety and depression   Increase knowledge and/or ability of: coping skills   Demonstrate ability to: Begin healthy grieving over loss  Progress towards Goals: Ongoing  Interventions: Interventions utilized:  CBT Cognitive Behavioral Therapy and Supportive Counseling Standardized Assessments completed: GAD-7 and PHQ 9 GAD 7 : Generalized Anxiety Score 12/31/2020 12/10/2020 11/26/2020 11/19/2020  Nervous, Anxious, on Edge 1 0 1 1  Control/stop worrying 1 1 1 1   Worry too much - different things 1 1 1 1   Trouble relaxing 0 0 0 0  Restless 0 0 0 0  Easily annoyed or irritable 0 0 0 0  Afraid - awful might happen 0 0 0 0  Total GAD 7 Score 3 2 3 3   Anxiety Difficulty Not difficult at all - - -     Depression screen [facility] 2/9 12/31/2020 12/10/2020 11/26/2020 11/12/2020 10/22/2020  Decreased Interest 0 0 0 1 0  Down, Depressed, Hopeless 0 0 0 0 0  PHQ - 2 Score 0 0 0 1 0  Altered sleeping 1 1 1 1 2   Tired, decreased energy 1 0 1 1 1   Change in appetite 0 1 1 1 1   Feeling bad or failure about yourself  0 0 0 0 0  Trouble concentrating 0 0 0 0 1  Moving slowly or fidgety/restless 0 0 0 0 0  Suicidal thoughts 0 0 0 0 0  PHQ-9 Score 2 2 3 4 5   Difficult doing work/chores Not difficult at all - - - -    Patient and/or Family Response: Pt receptive to tx. Pt receptive to cognitive restructuring to decrease negative thoughts related to grief. Pt receptive to psychoeducation provided on seasonal depression. Pt receptive to support and empowerment as pt plans to stay home for  thanksgiving. Pt will continue healthy coping skills (journalling, deep breathing, and listening to music).   Patient Centered Plan: Patient is on the following Treatment Plan(s): Grief, depression, and anxiety  Assessment: Denies SI/HI. Denies auditory/visual hallucinations. No safety risks. Patient currently experiencing depression and anxiety related to grief. Pt continues to adjust to the loss of her brother. Pt has not received an autopsy report on her brother and is having difficulty coping with not knowing the cause of her brother's death. Pt also desires to be alone for Thanksgiving and plans to visit family for Christmas.   Patient may benefit from continued CBT and support with her grief. LCSWA provided psychoeducation on seasonal depression and attempted to normalize pt's challenges with this time of year. Pt receptive to cognitive restructuring to decrease negative thoughts with grief. Pt will continue healthy coping skills (deep breathing, listening to music, and journalling). LCSWA will fu with pt additionally due to pt's difficulty with the holiday season.  Plan: Follow up with behavioral health clinician on : 01/21/21 Behavioral recommendations: Continue healthy coping skills Referral(s): [place] (In Clinic) "From scale of 1-10, how likely are you to follow plan?": 10  Orazio Weller C [place], LCSW

## 2020-12-31 NOTE — Progress Notes (Deleted)
Office Visit Note  Patient: Vickie Pearson             Date of Birth: 1956/01/09           MRN: [id number]             PCP: Ladell Pier, MD Referring: Ladell Pier, MD Visit Date: 01/14/2021 Occupation: @GUAROCC @  Subjective:  No chief complaint on file.   History of Present Illness: Vickie Pearson is a 65 y.o. female ***   Activities of Daily Living:  Patient reports morning stiffness for *** {minute/hour:19697}.   Patient {ACTIONS;DENIES/REPORTS:21021675::"Denies"} nocturnal pain.  Difficulty dressing/grooming: {ACTIONS;DENIES/REPORTS:21021675::"Denies"} Difficulty climbing stairs: {ACTIONS;DENIES/REPORTS:21021675::"Denies"} Difficulty getting out of chair: {ACTIONS;DENIES/REPORTS:21021675::"Denies"} Difficulty using hands for taps, buttons, cutlery, and/or writing: {ACTIONS;DENIES/REPORTS:21021675::"Denies"}  No Rheumatology ROS completed.   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   Hx of abnormal cervical Pap smear 07/24/2016   OA (osteoarthritis) of knee 07/24/2016    Past Medical History:  Diagnosis Date   Colon polyps    Coronary artery calcification of native artery 02/21/2019   Minimal calcification noted 11/2018 on coronary CTA   Costochondritis    DDD (degenerative disc disease), cervical    Degenerative arthritis of knee    Bilateral   Degenerative disc disease,  cervical    Degenerative disc disease, cervical    Fibromyalgia    GERD (gastroesophageal reflux disease)    Hyperlipidemia    Inappropriate sinus tachycardia 02/21/2019   Monoclonal gammopathy    Multiple thyroid nodules    Osteoporosis    Rheumatoid arthritis ([place])     Family History  Problem Relation Age of Onset   Hypertension Mother    Colon polyps Mother    Stroke Mother    Arthritis Mother    Hypertension Father    Heart disease Father    Arthritis Father    Kidney disease Father    Hypertension Sister    Hyperlipidemia Sister    Hypertension Brother    Hyperlipidemia Brother    Esophageal cancer Neg Hx    Rectal cancer Neg Hx    Stomach cancer Neg Hx    Osteoporosis Neg Hx    Past Surgical History:  Procedure Laterality Date   BREAST BIOPSY Right 2022   COLONOSCOPY  2012   COLONOSCOPY  2015   POLYPECTOMY     Social History   Social History Narrative   Not on file   Immunization History  Administered Date(s) Administered   Fluad Quad(high Dose 65+) 02/21/2020   Influenza, High Dose Seasonal PF 11/10/2018   Influenza, Quadrivalent, Recombinant, Inj, Pf 11/15/2017   Influenza,inj,Quad PF,6+ Mos 11/09/2016, 11/10/2018   PFIZER(Purple Top)SARS-COV-2 Vaccination 03/17/2019, 04/07/2019, 11/17/2019     Objective: Vital Signs: There were no vitals taken for this visit.   Physical Exam   Musculoskeletal Exam: ***  CDAI Exam: CDAI Score: -- Patient Global: --; Provider Global: -- Swollen: --; Tender: -- Joint Exam 01/14/2021   No joint exam has  been documented for this visit   There is currently no information documented on the homunculus. Go to the Rheumatology activity and complete the homunculus joint exam.  Investigation: No additional findings.  Imaging: No results found.  Recent Labs: Lab Results  Component Value Date   WBC 5.8 07/02/2020   HGB 13.7 07/02/2020   PLT 253 07/02/2020   NA 139 10/02/2020   K 3.9 10/02/2020   CL 105  10/02/2020   CO2 26 10/02/2020   GLUCOSE 90 10/02/2020   BUN 10 10/02/2020   CREATININE 0.89 10/02/2020   BILITOT 0.2 09/30/2020   ALKPHOS 85 09/30/2020   AST 15 09/30/2020   ALT 8 09/30/2020   PROT 6.9 09/30/2020   ALBUMIN 4.1 09/30/2020   CALCIUM 9.7 10/02/2020   CALCIUM 9.7 10/02/2020   GFRAA 78 07/02/2020    Speciality Comments: No specialty comments available.  Procedures:  No procedures performed Allergies: Penicillins, Piroxicam, and Sulfa antibiotics   Assessment / Plan:     Visit Diagnoses: Rheumatoid arthritis of multiple sites with negative rheumatoid factor (HCC)  High risk medication use  Chronic iritis of both eyes  Primary osteoarthritis of both knees  Trochanteric bursitis, left hip  DDD (degenerative disc disease), cervical  DDD (degenerative disc disease), lumbar  Age-related osteoporosis without current pathological fracture  Vitamin D deficiency  Monoclonal gammopathy  Multiple thyroid nodules  Mixed hyperlipidemia  Orders: No orders of the defined types were placed in this encounter.  No orders of the defined types were placed in this encounter.   Face-to-face time spent with patient was *** minutes. Greater than 50% of time was spent in counseling and coordination of care.  Follow-Up Instructions: No follow-ups on file.   Ofilia Neas, PA-C  Note - This record has been created using Dragon software.  Chart creation errors have been sought, but may not always  have been located. Such creation errors do not reflect on  the standard of medical care.

## 2021-01-04 ENCOUNTER — Other Ambulatory Visit: Payer: Self-pay | Admitting: Cardiovascular Disease

## 2021-01-06 NOTE — Telephone Encounter (Signed)
Another message left to call me to schedule appointment,

## 2021-01-13 NOTE — Telephone Encounter (Signed)
Closed

## 2021-01-14 ENCOUNTER — Ambulatory Visit: Payer: Medicare Other | Admitting: Physician Assistant

## 2021-01-14 ENCOUNTER — Encounter: Payer: Self-pay | Admitting: Internal Medicine

## 2021-01-14 ENCOUNTER — Other Ambulatory Visit: Payer: Self-pay

## 2021-01-14 DIAGNOSIS — M170 Bilateral primary osteoarthritis of knee: Secondary | ICD-10-CM

## 2021-01-14 DIAGNOSIS — D472 Monoclonal gammopathy: Secondary | ICD-10-CM

## 2021-01-14 DIAGNOSIS — F4322 Adjustment disorder with anxiety: Secondary | ICD-10-CM

## 2021-01-14 DIAGNOSIS — M5136 Other intervertebral disc degeneration, lumbar region: Secondary | ICD-10-CM

## 2021-01-14 DIAGNOSIS — M810 Age-related osteoporosis without current pathological fracture: Secondary | ICD-10-CM

## 2021-01-14 DIAGNOSIS — E042 Nontoxic multinodular goiter: Secondary | ICD-10-CM

## 2021-01-14 DIAGNOSIS — E782 Mixed hyperlipidemia: Secondary | ICD-10-CM

## 2021-01-14 DIAGNOSIS — M5030 Other cervical disc degeneration, unspecified cervical region: Secondary | ICD-10-CM

## 2021-01-14 DIAGNOSIS — M0609 Rheumatoid arthritis without rheumatoid factor, multiple sites: Secondary | ICD-10-CM

## 2021-01-14 DIAGNOSIS — H2013 Chronic iridocyclitis, bilateral: Secondary | ICD-10-CM

## 2021-01-14 DIAGNOSIS — Z79899 Other long term (current) drug therapy: Secondary | ICD-10-CM

## 2021-01-14 DIAGNOSIS — M7062 Trochanteric bursitis, left hip: Secondary | ICD-10-CM

## 2021-01-14 DIAGNOSIS — E559 Vitamin D deficiency, unspecified: Secondary | ICD-10-CM

## 2021-01-14 MED ORDER — HYDROXYZINE PAMOATE 25 MG PO CAPS: 25.0000 mg | ORAL_CAPSULE | Freq: Two times a day (BID) | ORAL | 1 refills | Status: DC | PRN

## 2021-01-14 NOTE — Telephone Encounter (Signed)
Please clarify why she has not started arava and see if she plans to start it in the future?

## 2021-01-21 ENCOUNTER — Ambulatory Visit (INDEPENDENT_AMBULATORY_CARE_PROVIDER_SITE_OTHER): Payer: Medicare Other | Admitting: Clinical

## 2021-01-21 ENCOUNTER — Other Ambulatory Visit: Payer: Self-pay

## 2021-01-21 DIAGNOSIS — F4323 Adjustment disorder with mixed anxiety and depressed mood: Secondary | ICD-10-CM

## 2021-01-24 NOTE — BH Specialist Note (Signed)
Integrated Behavioral Health Follow Up In-Person Visit  MRN: [id number] Name: Vickie Pearson  Number of [place] Clinician visits:  7 Session Start time: 10:30am  Session End time: 11:00am Total time: 30 minutes  Types of Service: Individual psychotherapy  Interpretor:No. Interpretor Name and Language: N/A  Subjective: Vickie Pearson is a 64 y.o. female accompanied by  self Patient was referred by Karle Plumber, MD for grief. Patient reports the following symptoms/concerns: Reports worrying and depressed at times.  Reports that she still has not received the autopsy report of her brother. Reports that thanksgiving was challenging due to this being the first holiday without her brother. Duration of problem: 4 months; Severity of problem: mild  Objective: Mood: Anxious and Affect: Appropriate Risk of harm to self or others: No plan to harm self or others  Life Context: Family and Social: Reports that she lives alone. Reports her support system as friends and her daughter. Reports that majority of her family does not live in [place]. Reports that her husband is deceased.  School/Work: Pt is retired.  Self-Care: Reports prayer, church, and spending time with friends as coping skills.  Life Changes: Pt's brother passed away four months ago. Pt still has not received an autopsy report.  Patient and/or Family's Strengths/Protective Factors: Social and Emotional competence and Concrete supports in place (healthy food, safe environments, etc.)  Goals Addressed: Patient will:  Reduce symptoms of: anxiety and depression   Increase knowledge and/or ability of: coping skills   Demonstrate ability to: Begin healthy grieving over loss  Progress towards Goals: Ongoing  Interventions: Interventions utilized:  CBT Cognitive Behavioral Therapy and Supportive Counseling Standardized Assessments completed: Not Needed  Patient and/or Family Response:  Pt receptive to tx. Pt receptive to psychoeducation provided on "seasonal depression" related to the holidays. Pt receptive to increasing time with family and friends and sunlight exposure.  Patient Centered Plan: Patient is on the following Treatment Plan(s): Grief  Assessment: Denies SI/HI. Denies auditory/visual hallucinations. No safety risks. Patient currently experiencing grief related to the loss of her brother. Pt continues to process still not receiving the autopsy report and worrying about what happened. Pt acknowledged the difficulty with not having her brother around for Thanksgiving. .   Patient may benefit from continuing to discuss more about her brother's life. LCSWA provided psychoeducation on "seasonal depression" related to the holidays. LCSWA encouraged pt to increase time with family/friends and sunlight exposure. LCSWA will fu with pt.  Plan: Follow up with behavioral health clinician on : 02/25/21 Behavioral recommendations: Increase time with family/friends and sunlight exposure. Referral(s): [place] (In Clinic) "From scale of 1-10, how likely are you to follow plan?": 10  Emelio Schneller C Yaziel Brandon, LCSW

## 2021-02-19 ENCOUNTER — Other Ambulatory Visit: Payer: Self-pay | Admitting: Hematology and Oncology

## 2021-02-19 DIAGNOSIS — D472 Monoclonal gammopathy: Secondary | ICD-10-CM

## 2021-02-19 NOTE — Progress Notes (Signed)
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 Lebron Conners at [facility]. M protein 0.2, kappa 10.1, lambda 9.6, ratio 1.05. Met survey 11/24/2016 showed no evidence of lytic lesions.  2) 11/10/2017: M protein 0.1, kappa 12.5, lambda 8.2, ratio 1.52.  3) 02/14/2019: establish care with Dr. Lorenso Courier   Interval History:  Vickie Pearson 66 y.o. female with medical history significant for IgA kappa MGUS presents for a follow up visit. The patient's last visit was on 02/21/2020. In the interim since the last visit has had no major changes in her health.  On exam today Vickie Pearson notes no big changes in her health in the interim since her last visit last year.  She reports that she does continue to have pain in her lower back due to degenerative disc disease.  She reports that that can occasionally shoot down her leg.  She is working with physical therapy and orthopedics in order to try to help improve this.  She notes no other bone pain.  She reports her energy levels have been pretty good and her appetite has been stable as well as her weight.  She reports that she is not taking any new medications other than Reclast which was prescribed in order to help with her osteopenia.  She otherwise denies having any fevers, chills, sweats, nausea, vomiting or diarrhea.  She has no new bone or back pain.  Full 10 point ROS is listed below.  MEDICAL HISTORY:  Past Medical History:  Diagnosis Date   Colon polyps    Coronary artery calcification of native artery 02/21/2019   Minimal calcification noted 11/2018 on coronary CTA   Costochondritis    DDD (degenerative disc disease), cervical     Degenerative arthritis of knee    Bilateral   Degenerative disc disease, cervical    Degenerative disc disease, cervical    Fibromyalgia    GERD (gastroesophageal reflux disease)    Hyperlipidemia    Inappropriate sinus tachycardia 02/21/2019   Monoclonal gammopathy    Multiple thyroid nodules    Osteoporosis    Rheumatoid arthritis ([place])     SURGICAL HISTORY: Past Surgical History:  Procedure Laterality Date   BREAST BIOPSY Right 2022   COLONOSCOPY  2012   COLONOSCOPY  2015   POLYPECTOMY      SOCIAL HISTORY: Social History   Socioeconomic History   Marital status: Widowed    Spouse name: Not on file   Number of children: Not on file   Years of education: Not on file   Highest education level: Not on file  Occupational History   Not on file  Tobacco Use   Smoking status: Former    Packs/day: 1.00    Types: Cigarettes    Quit date: 07/24/1988    Years since quitting: 32.6   Smokeless tobacco: Never  Vaping Use   Vaping Use: Never used  Substance and Sexual Activity   Alcohol use: Yes    Comment: occasionally   Drug use: No   Sexual activity: Not on file  Other Topics Concern   Not on file  Social History Narrative   Not on file   Social Determinants of Health   Financial Resource Strain: Not on file  Food Insecurity:  Not on file  Transportation Needs: Not on file  Physical Activity: Not on file  Stress: Not on file  Social Connections: Not on file  Intimate Partner Violence: Not on file    FAMILY HISTORY: Family History  Problem Relation Age of Onset   Hypertension Mother    Colon polyps Mother    Stroke Mother    Arthritis Mother    Hypertension Father    Heart disease Father    Arthritis Father    Kidney disease Father    Hypertension Sister    Hyperlipidemia Sister    Hypertension Brother    Hyperlipidemia Brother    Esophageal cancer Neg Hx    Rectal cancer Neg Hx    Stomach cancer Neg Hx    Osteoporosis Neg Hx     ALLERGIES:  is  allergic to penicillins, piroxicam, and sulfa antibiotics.  MEDICATIONS:  Current Outpatient Medications  Medication Sig Dispense Refill   cholecalciferol (VITAMIN D) 1000 units tablet Take 3,000 Units by mouth daily.     Evolocumab with Infusor ([place]) 420 MG/3.5ML SOCT INJECT 1 APPLICATOR INTO THE SKIN EVERY 30 DAYS 3.5 mL 11   ezetimibe (ZETIA) 10 MG tablet Take 1 tablet (10 mg total) by mouth daily. 90 tablet 3   fluticasone (FLONASE) 50 MCG/ACT nasal spray Place 1 spray into both nostrils as needed for allergies or rhinitis.     hydrOXYzine (VISTARIL) 25 MG capsule Take 1 capsule (25 mg total) by mouth 2 (two) times daily as needed. 30 capsule 1   Lactobacillus (PROBIOTIC ACIDOPHILUS PO) Take 1 capsule by mouth daily.     leflunomide (ARAVA) 10 MG tablet Take 1 tablet (10 mg total) by mouth daily. 30 tablet 2   Magnesium Citrate 100 MG TABS Take by mouth daily as needed.      metoprolol succinate (TOPROL-XL) 25 MG 24 hr tablet TAKE 1 TABLET BY MOUTH ONCE DAILY WITH  OR  IMMEDIATELY  FOLLOWING  A  MEAL 90 tablet 3   sertraline (ZOLOFT) 50 MG tablet 1/2 tab PO daily x 4 weeks then 1 tab daily 30 tablet 1   vitamin B-12 (CYANOCOBALAMIN) 1000 MCG tablet Take 1 tablet by mouth daily.     zinc sulfate 220 (50 Zn) MG capsule Take by mouth daily.      No current facility-administered medications for this visit.    REVIEW OF SYSTEMS:   Constitutional: ( - ) fevers, ( - )  chills , ( - ) night sweats Eyes: ( - ) blurriness of vision, ( - ) double vision, ( - ) watery eyes Ears, nose, mouth, throat, and face: ( - ) mucositis, ( - ) sore throat Respiratory: ( - ) cough, ( - ) dyspnea, ( - ) wheezes Cardiovascular: ( - ) palpitation, ( - ) chest discomfort, ( - ) lower extremity swelling Gastrointestinal:  ( - ) nausea, ( - ) heartburn, ( - ) change in bowel habits Skin: ( - ) abnormal skin rashes Lymphatics: ( - ) new lymphadenopathy, ( - ) easy bruising Neurological: ( - )  numbness, ( - ) tingling, ( - ) new weaknesses Behavioral/Psych: ( - ) mood change, ( - ) new changes  All other systems were reviewed with the patient and are negative.  PHYSICAL EXAMINATION: ECOG PERFORMANCE STATUS: 1 - Symptomatic but completely ambulatory  Vitals:   02/20/21 1041  BP: (!) 141/74  Pulse: 69  Resp: 17  Temp: (!) 96.9 F (36.1 C)  SpO2: 100%    Filed Weights   02/20/21 1041  Weight: 152 lb 14.4 oz (69.4 kg)     GENERAL: well appearing elderly African American female alert, no distress and comfortable SKIN: skin color, texture, turgor are normal, no rashes or significant lesions EYES: conjunctiva are pink and non-injected, sclera clear LUNGS: clear to auscultation and percussion with normal breathing effort HEART: regular rate & rhythm and no murmurs and no lower extremity edema Musculoskeletal: no cyanosis of digits and no clubbing  PSYCH: alert & oriented x 3, fluent speech NEURO: no focal motor/sensory deficits  LABORATORY DATA:  I have reviewed the data as listed CBC Latest Ref Rng & Units 02/20/2021 07/02/2020 02/21/2020  WBC 4.0 - 10.5 K/uL 5.5 5.8 5.4  Hemoglobin 12.0 - 15.0 g/dL 13.1 13.7 12.9  Hematocrit 36.0 - 46.0 % 39.3 41.1 39.4  Platelets 150 - 400 K/uL 235 253 243    CMP Latest Ref Rng & Units 02/20/2021 10/02/2020 10/02/2020  Glucose 70 - 99 mg/dL 76 - 90  BUN 8 - 23 mg/dL 14 - 10  Creatinine 0.44 - 1.00 mg/dL 0.86 - 0.89  Sodium 135 - 145 mmol/L 141 - 139  Potassium 3.5 - 5.1 mmol/L 3.7 - 3.9  Chloride 98 - 111 mmol/L 109 - 105  CO2 22 - 32 mmol/L 26 - 26  Calcium 8.9 - 10.3 mg/dL 9.3 9.7 9.7  Total Protein 6.5 - 8.1 g/dL 7.0 - -  Total Bilirubin 0.3 - 1.2 mg/dL 0.4 - -  Alkaline Phos 38 - 126 U/L 64 - -  AST 15 - 41 U/L 14(L) - -  ALT 0 - 44 U/L 8 - -    Lab Results  Component Value Date   MPROTEIN Not Observed 02/21/2020   MPROTEIN 0.2 (H) 02/15/2019   MPROTEIN Not Observed 05/12/2017   Lab Results  Component Value Date    KPAFRELGTCHN 18.7 02/20/2021   KPAFRELGTCHN 16.5 02/21/2020   KPAFRELGTCHN 17.5 02/15/2019   LAMBDASER 10.4 02/20/2021   LAMBDASER 10.1 02/21/2020   LAMBDASER 12.0 02/15/2019   KAPLAMBRATIO 1.80 (H) 02/20/2021   KAPLAMBRATIO 1.63 02/21/2020   KAPLAMBRATIO >18.86 02/23/2019    RADIOGRAPHIC STUDIES: No results found.  ASSESSMENT & PLAN Vickie Pearson 66 y.o. female with medical history significant for IgA kappa MGUS  presents for a follow up visit.    After review of the labs and discussion with the patient, her finding is most consistent with monoclonal gammopathy of undetermined significance.  It appears at this has been very stable over the last 2 years, with a very low M protein.  Given these findings I think would be reasonable to continue to observe on a yearly basis with SPEP, UPEP, and serum free light chains.   Technically the patient meets the criteria for a bone marrow biopsy with an IgA MGUS, however given the extremely low levels of M protein and the lack of any anemia or increased calcium I think that observation would be appropriate at this time.   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 --yearly metastatic survey is due (last in Jan 2021) --M protein is only mildly elevated and Hgb/Ca/Cr are WNL. As such I recommend holding on a bone marrow biopsy at this time. --RTC in 1 years time for repeat serologies and metastatic survey.   Orders Placed This Encounter  Procedures   DG Bone Survey Met    Standing Status:   Future  Standing Expiration Date:   02/20/2022    Order Specific Question:   Reason for Exam (SYMPTOM  OR DIAGNOSIS REQUIRED)    Answer:   MGUS, assess for lytic lesions    Order Specific Question:   Preferred imaging location?    Answer:   [facility]    All questions were answered. The patient knows to call the clinic with any problems, questions or concerns.  A total of more than  30 minutes were spent on this encounter and over half of that time was spent on counseling and coordination of care as outlined above.   Ledell Peoples, MD Department of Hematology/Oncology [place] at [facility] Phone: [phone] Pager: [phone] Email: [email or web address]  02/23/2021 4:43 PM

## 2021-02-20 ENCOUNTER — Inpatient Hospital Stay: Payer: Medicare Other

## 2021-02-20 ENCOUNTER — Inpatient Hospital Stay: Payer: Medicare Other | Attending: Hematology and Oncology | Admitting: Hematology and Oncology

## 2021-02-20 ENCOUNTER — Other Ambulatory Visit: Payer: Self-pay

## 2021-02-20 VITALS — BP 141/74 | HR 69 | Temp 96.9°F | Resp 17 | Wt 152.9 lb

## 2021-02-20 DIAGNOSIS — Z87891 Personal history of nicotine dependence: Secondary | ICD-10-CM | POA: Insufficient documentation

## 2021-02-20 DIAGNOSIS — D472 Monoclonal gammopathy: Secondary | ICD-10-CM

## 2021-02-20 LAB — LACTATE DEHYDROGENASE: LDH: 155 U/L (ref 98–192)

## 2021-02-20 LAB — CBC WITH DIFFERENTIAL (CANCER CENTER ONLY)
Abs Immature Granulocytes: 0.01 10*3/uL (ref 0.00–0.07)
Basophils Absolute: 0 10*3/uL (ref 0.0–0.1)
Basophils Relative: 1 %
Eosinophils Absolute: 0.2 10*3/uL (ref 0.0–0.5)
Eosinophils Relative: 4 %
HCT: 39.3 % (ref 36.0–46.0)
Hemoglobin: 13.1 g/dL (ref 12.0–15.0)
Immature Granulocytes: 0 %
Lymphocytes Relative: 46 %
Lymphs Abs: 2.6 10*3/uL (ref 0.7–4.0)
MCH: 31.3 pg (ref 26.0–34.0)
MCHC: 33.3 g/dL (ref 30.0–36.0)
MCV: 93.8 fL (ref 80.0–100.0)
Monocytes Absolute: 0.4 10*3/uL (ref 0.1–1.0)
Monocytes Relative: 8 %
Neutro Abs: 2.3 10*3/uL (ref 1.7–7.7)
Neutrophils Relative %: 41 %
Platelet Count: 235 10*3/uL (ref 150–400)
RBC: 4.19 MIL/uL (ref 3.87–5.11)
RDW: 15.1 % (ref 11.5–15.5)
WBC Count: 5.5 10*3/uL (ref 4.0–10.5)
nRBC: 0 % (ref 0.0–0.2)

## 2021-02-20 LAB — CMP (CANCER CENTER ONLY)
ALT: 8 U/L (ref 0–44)
AST: 14 U/L — ABNORMAL LOW (ref 15–41)
Albumin: 3.9 g/dL (ref 3.5–5.0)
Alkaline Phosphatase: 64 U/L (ref 38–126)
Anion gap: 6 (ref 5–15)
BUN: 14 mg/dL (ref 8–23)
CO2: 26 mmol/L (ref 22–32)
Calcium: 9.3 mg/dL (ref 8.9–10.3)
Chloride: 109 mmol/L (ref 98–111)
Creatinine: 0.86 mg/dL (ref 0.44–1.00)
GFR, Estimated: >60 mL/min (ref >60)
Glucose, Bld: 76 mg/dL (ref 70–99)
Potassium: 3.7 mmol/L (ref 3.5–5.1)
Sodium: 141 mmol/L (ref 135–145)
Total Bilirubin: 0.4 mg/dL (ref 0.3–1.2)
Total Protein: 7 g/dL (ref 6.5–8.1)

## 2021-02-21 LAB — KAPPA/LAMBDA LIGHT CHAINS
Kappa free light chain: 18.7 mg/L (ref 3.3–19.4)
Kappa, lambda light chain ratio: 1.8 — ABNORMAL HIGH (ref 0.26–1.65)
Lambda free light chains: 10.4 mg/L (ref 5.7–26.3)

## 2021-02-24 ENCOUNTER — Telehealth: Payer: Self-pay | Admitting: Hematology and Oncology

## 2021-02-24 LAB — MULTIPLE MYELOMA PANEL, SERUM
Albumin SerPl Elph-Mcnc: 3.7 g/dL (ref 2.9–4.4)
Albumin/Glob SerPl: 1.3 (ref 0.7–1.7)
Alpha 1: 0.2 g/dL (ref 0.0–0.4)
Alpha2 Glob SerPl Elph-Mcnc: 0.7 g/dL (ref 0.4–1.0)
B-Globulin SerPl Elph-Mcnc: 1 g/dL (ref 0.7–1.3)
Gamma Glob SerPl Elph-Mcnc: 1.2 g/dL (ref 0.4–1.8)
Globulin, Total: 3 g/dL (ref 2.2–3.9)
IgA: 259 mg/dL (ref 87–352)
IgG (Immunoglobin G), Serum: 1208 mg/dL (ref 586–1602)
IgM (Immunoglobulin M), Srm: 49 mg/dL (ref 26–217)
Total Protein ELP: 6.7 g/dL (ref 6.0–8.5)

## 2021-02-24 NOTE — Telephone Encounter (Signed)
Scheduled appointment per 01/12 los. Patient is aware.

## 2021-02-25 ENCOUNTER — Other Ambulatory Visit: Payer: Self-pay

## 2021-02-25 ENCOUNTER — Ambulatory Visit (INDEPENDENT_AMBULATORY_CARE_PROVIDER_SITE_OTHER): Payer: Medicare Other | Admitting: Clinical

## 2021-02-25 DIAGNOSIS — F4323 Adjustment disorder with mixed anxiety and depressed mood: Secondary | ICD-10-CM | POA: Diagnosis not present

## 2021-02-25 NOTE — BH Specialist Note (Signed)
Integrated Behavioral Health Follow Up In-Person Visit  MRN: [id number] Name: Vickie Pearson  Number of [place] Clinician visits:  8 Session Start time: 10:35am  Session End time: 11:00am Total time:  25  minutes  Types of Service: Individual psychotherapy  Interpretor:No. Interpretor Name and Language: N/A  Subjective: Vickie Pearson is a 66 y.o. female accompanied by  self Patient was referred by Karle Plumber, MD for grief. Patient reports the following symptoms/concerns: Reports an improvement in her mood since being able to visit her family in [place]. Reports missing her brother and having to adjust to not being around her brother during the holidays. Reports that she still has not received an autopsy report and is unsure about what happened to her brother.  Duration of problem: 5 months; Severity of problem: mild  Objective: Mood: Anxious and Affect: Appropriate Risk of harm to self or others: No plan to harm self or others  Life Context: Family and Social: Reports that she lives alone. Reports her support system as friends and her daughter. Reports that majority of her family does not live in [place]. Reports that her husband is deceased.  School/Work: Pt is retired.  Self-Care: Reports prayer, church, and spending time with friends as coping skills.  Life Changes: Pt's brother passed away four months ago. Pt still has not received an autopsy report. (No changes to life context)  Patient and/or Family's Strengths/Protective Factors: Social and Emotional competence and Concrete supports in place (healthy food, safe environments, etc.)  Goals Addressed: Patient will:  Reduce symptoms of: anxiety and depression   Increase knowledge and/or ability of: coping skills   Demonstrate ability to: Increase healthy adjustment to current life circumstances and Begin healthy grieving over loss  Progress towards Goals: Revised and  Ongoing  Interventions: Interventions utilized:  CBT Cognitive Behavioral Therapy and Supportive Counseling Standardized Assessments completed: GAD-7 and PHQ 9 GAD 7 : Generalized Anxiety Score 02/25/2021 12/31/2020 12/10/2020 11/26/2020  Nervous, Anxious, on Edge 1 1 0 1  Control/stop worrying 0 1 1 1   Worry too much - different things 1 1 1 1   Trouble relaxing 0 0 0 0  Restless 0 0 0 0  Easily annoyed or irritable 0 0 0 0  Afraid - awful might happen 0 0 0 0  Total GAD 7 Score 2 3 2 3   Anxiety Difficulty - Not difficult at all - -     Depression screen [facility] 2/9 02/25/2021 12/31/2020 12/10/2020 11/26/2020 11/12/2020  Decreased Interest 0 0 0 0 1  Down, Depressed, Hopeless 0 0 0 0 0  PHQ - 2 Score 0 0 0 0 1  Altered sleeping 1 1 1 1 1   Tired, decreased energy 0 1 0 1 1  Change in appetite 0 0 1 1 1   Feeling bad or failure about yourself  0 0 0 0 0  Trouble concentrating 0 0 0 0 0  Moving slowly or fidgety/restless 0 0 0 0 0  Suicidal thoughts 0 0 0 0 0  PHQ-9 Score 1 2 2 3 4   Difficult doing work/chores - Not difficult at all - - -    Patient and/or Family Response: Pt receptive to tx. Pt receptive to support provided as pt disclosed her frustrations with not receiving the autopsy report. Pt receptive to affirmations. Pt receptive to assistance with cognitive processing.   Patient Centered Plan: Patient is on the following Treatment Plan(s): Grief  Assessment: Denies SI/HI. Denies auditory/visual hallucinations. Patient currently experiencing  grief related to the loss of her brother. Pt's mood appears to have improved. Pt's barrier in acceptance in grief appears to be associated with not receiving the autopsy report for her brother.   Patient may benefit from CBT. LCSWA provided support as pt disclosed her frustrations with not receiving the autopsy report for her brother. LCSWA provided affirmation for pt's progress. LCSWA provided assistance with cognitive  processing.  Plan: Follow up with behavioral health clinician on : 03/18/21 Behavioral recommendations: Fu with LCSWA Referral(s): [place] (In Clinic) "From scale of 1-10, how likely are you to follow plan?": 10  Chaos Carlile C Shalin Linders, LCSW

## 2021-02-26 ENCOUNTER — Telehealth: Payer: Self-pay | Admitting: Cardiovascular Disease

## 2021-02-26 NOTE — Telephone Encounter (Signed)
Pt states that it is time to renew her [employer] financial assistance... please advise

## 2021-02-27 ENCOUNTER — Ambulatory Visit (HOSPITAL_COMMUNITY)
Admission: RE | Admit: 2021-02-27 | Discharge: 2021-02-27 | Disposition: A | Discharge status: Home / Self Care | Payer: Medicare Other | Source: Ambulatory Visit | Attending: Hematology and Oncology | Admitting: Hematology and Oncology

## 2021-02-27 ENCOUNTER — Other Ambulatory Visit: Payer: Self-pay

## 2021-02-27 DIAGNOSIS — D472 Monoclonal gammopathy: Secondary | ICD-10-CM | POA: Insufficient documentation

## 2021-02-28 ENCOUNTER — Telehealth: Payer: Self-pay

## 2021-02-28 NOTE — Telephone Encounter (Signed)
Called and left HIPPA compliant voicemail requesting call back to review test results.

## 2021-02-28 NOTE — Telephone Encounter (Signed)
Per Dr. Lorenso Courier, spoke with pt to let her know that her Bone Survey showed no abnormalities and that her labs were fine. Return to clinic in 12 months.

## 2021-03-03 DIAGNOSIS — D472 Monoclonal gammopathy: Secondary | ICD-10-CM | POA: Diagnosis not present

## 2021-03-03 NOTE — Telephone Encounter (Signed)
I sent a Chemical engineer renewal information for the pt to take to the pharmacy

## 2021-03-05 LAB — UPEP/UIFE/LIGHT CHAINS/TP, 24-HR UR
% BETA, Urine: 0 %
ALPHA 1 URINE: 0 %
Albumin, U: 100 %
Alpha 2, Urine: 0 %
Free Kappa Lt Chains,Ur: 17.36 mg/L (ref 1.17–86.46)
Free Kappa/Lambda Ratio: 8.99 (ref 1.83–14.26)
Free Lambda Lt Chains,Ur: 1.93 mg/L (ref 0.27–15.21)
GAMMA GLOBULIN URINE: 0 %
Total Protein, Urine-Ur/day: 104 mg/24 hr (ref 30–150)
Total Protein, Urine: 6.5 mg/dL

## 2021-03-11 ENCOUNTER — Telehealth: Payer: Self-pay | Admitting: Endocrinology

## 2021-03-11 NOTE — Telephone Encounter (Signed)
Pt is calling in stating that she would like to keep her appointment but would like to have a different doctor in the office.

## 2021-03-17 ENCOUNTER — Telehealth: Payer: Self-pay

## 2021-03-17 ENCOUNTER — Encounter: Payer: Self-pay | Admitting: Internal Medicine

## 2021-03-17 DIAGNOSIS — F4322 Adjustment disorder with anxiety: Secondary | ICD-10-CM

## 2021-03-17 MED ORDER — HYDROXYZINE PAMOATE 25 MG PO CAPS: 25.0000 mg | ORAL_CAPSULE | Freq: Two times a day (BID) | ORAL | 1 refills | Status: DC | PRN

## 2021-03-17 NOTE — Telephone Encounter (Signed)
-----   Message from Otila Kluver, RN sent at 03/17/2021 10:48 AM EST -----  ----- Message ----- From: Orson Slick, MD Sent: 03/06/2021   9:48 AM EST To: Otila Kluver, RN  Please let Vickie Pearson know that her MGUS bloodwork is stable from prior. We will plan to see her back in Jan 2024.  ----- Message ----- From: Buel Ream, Lab In [place] Sent: 02/20/2021  10:34 AM EST To: Orson Slick, MD

## 2021-03-17 NOTE — Telephone Encounter (Signed)
Pt advised of lab results and voiced understanding.

## 2021-03-17 NOTE — Telephone Encounter (Signed)
Attempted to contact pt.  Left message for her to return my call.  HB

## 2021-03-18 ENCOUNTER — Other Ambulatory Visit: Payer: Self-pay

## 2021-03-18 ENCOUNTER — Ambulatory Visit (INDEPENDENT_AMBULATORY_CARE_PROVIDER_SITE_OTHER): Payer: Medicare Other | Admitting: Clinical

## 2021-03-18 DIAGNOSIS — F411 Generalized anxiety disorder: Secondary | ICD-10-CM | POA: Diagnosis not present

## 2021-03-18 NOTE — BH Specialist Note (Signed)
ADULT Comprehensive Clinical Assessment (CCA) Note   03/18/2021 JAELYNN POZO [id number]   Referring Provider: Karle Plumber, MD Session Time:  1330 - 1430 60 minutes.  SUBJECTIVE: Vickie Pearson is a 66 y.o.   female accompanied by  self  Vickie Pearson was seen in consultation at the request of Ladell Pier, MD for evaluation of  anxiety .  Types of Service: Comprehensive Clinical Assessment (CCA)  Reason for referral in patient/family's own words:  "I expressed to my doctor that I was experiencing anxiety"    She likes to be called Vickie Pearson.  She came to the appointment with  self .  Primary language at home is [place].  Constitutional Appearance: cooperative, well-nourished, well-developed, alert and well-appearing  (Patient to answer as appropriate) Gender identity: Female Sex assigned at birth: Female Pronouns: she   Mental status exam:   General Appearance B[place] Mars:  Neat Eye Contact:  Good Motor Behavior:  Normal Speech:  Normal Level of Consciousness:  Alert Mood:  Anxious Affect:  Appropriate Anxiety Level:  Minimal Thought Process:  Coherent Thought Content:  WNL Perception:  Normal Judgment:  Good Insight:  Present   Current Medications and therapies: She is taking:   Outpatient Encounter Medications as of 03/18/2021  Medication Sig   cholecalciferol (VITAMIN D) 1000 units tablet Take 3,000 Units by mouth daily.   Evolocumab with Infusor ([place]) 420 MG/3.5ML SOCT INJECT 1 APPLICATOR INTO THE SKIN EVERY 30 DAYS   fluticasone (FLONASE) 50 MCG/ACT nasal spray Place 1 spray into both nostrils as needed for allergies or rhinitis.   hydrOXYzine (VISTARIL) 25 MG capsule Take 1 capsule (25 mg total) by mouth 2 (two) times daily as needed.   Lactobacillus (PROBIOTIC ACIDOPHILUS PO) Take 1 capsule by mouth daily.   leflunomide (ARAVA) 10 MG tablet Take 1 tablet (10 mg total) by mouth daily.   Magnesium  Citrate 100 MG TABS Take by mouth daily as needed.    metoprolol succinate (TOPROL-XL) 25 MG 24 hr tablet TAKE 1 TABLET BY MOUTH ONCE DAILY WITH  OR  IMMEDIATELY  FOLLOWING  A  MEAL   sertraline (ZOLOFT) 50 MG tablet 1/2 tab PO daily x 4 weeks then 1 tab daily   vitamin B-12 (CYANOCOBALAMIN) 1000 MCG tablet Take 1 tablet by mouth daily.   zinc sulfate 220 (50 Zn) MG capsule Take by mouth daily.    No facility-administered encounter medications on file as of 03/18/2021.      Therapies:  None  Family history: Family mental illness:  No information Family school achievement history:  No information Other relevant family history:  No known history of substance use or alcoholism  Social History: Now living with  self . N/A . Employment:   Engineer, maintenance caregiver's health:   N/A Religious or Spiritual Beliefs: AME zion  Mood: She  feels down at times . PHQ9 & GAD 7  Negative Mood Concerns She does not make negative statements about self. Self-injury:  No Suicidal ideation:  No Suicide attempt:  No  Additional Anxiety Concerns: Panic attacks:  No Obsessions:  No Compulsions:  No  Stressors:  Family death and Grief/losses  Alcohol and/or Substance Use: Have you recently consumed alcohol? no  Have you recently used any drugs?  no  Have you recently consumed any tobacco? no Does patient seem concerned about dependence or abuse of any substance? no  Substance Use Disorder Checklist:  N/A  Severity Risk Scoring based on DSM-5 Criteria  for Substance Use Disorder. The presence of at least two (2) criteria in the last 12 months indicate a substance use disorder. The severity of the substance use disorder is defined as:  Mild: Presence of 2-3 criteria Moderate: Presence of 4-5 criteria Severe: Presence of 6 or more criteria  Traumatic Experiences: History or current traumatic events (natural disaster, house fire, etc.)? no History or current physical trauma?   no History or current emotional trauma?  no History or current sexual trauma?  no History or current domestic or intimate partner violence?  no History of bullying:  no  Risk Assessment: Suicidal or homicidal thoughts?   no Self injurious behaviors?  no Guns in the home?  no  Self Harm Risk Factors:  Grief  Self Harm Thoughts?: No  Patient and/or Family's Strengths/Protective Factors: Sense of purpose  Patient's and/or Family's Goals in their own words: "I want to exercise more. I want to not worry as much and think of other things when I start to worry."  Interventions: Interventions utilized:  CBT Cognitive Behavioral Therapy and Supportive Counseling   Patient and/or Family Response: Pt receptive to tx. Pt receptive to assistance with cognitive processing. Pt will continue spending time outside of home, deep breathing, and listening to music.   Standardized Assessments completed: GAD-7 and PHQ 9  Patient Centered Plan: Patient is on the following Treatment Plan(s):  Anxiety  Coordination of Care: Written progress or summary reports in pt's chart  DSM-5 Diagnosis: 300.02 (F41.1) Generalized Anxiety Disorder  Pt has experienced significant anxiety for the past six months. Pt has found it difficulty control her worry related to her brother's death. Pt appears to experience difficulty with sleep, restlessness, and trouble concentrating due to becoming focused on her brother at times.   Recommendations for Services/Supports/Treatments: Pt would benefit from CBT at least once/month.  Progress towards Goals: Ongoing  Treatment Plan Summary: Behavioral Health Clinician will: Assess individual's status and evaluate for psychiatric symptoms, Provide coping skills enhancement, Utilize evidence based practices to address psychiatric symptoms, Provide therapeutic counseling and medication monitoring, and Educate individual about their illness and importance of  medication  compliance  Individual will: Complete all homework and actively participate during therapy, Report all reactions/side effects, concerns about medications to prescribing doctor provider, Take all medications as prescribed, Report any thoughts or plans of harming themselves or others, and Utilize coping skills taught in therapy to reduce symptoms  Referral(s): Integrated [employer] (In Clinic)  Antoney Biven C Biggs, LCSW

## 2021-04-08 ENCOUNTER — Encounter: Payer: Self-pay | Admitting: Internal Medicine

## 2021-04-09 ENCOUNTER — Ambulatory Visit: Payer: Self-pay

## 2021-04-09 ENCOUNTER — Ambulatory Visit (INDEPENDENT_AMBULATORY_CARE_PROVIDER_SITE_OTHER): Payer: Medicare Other | Admitting: Physician Assistant

## 2021-04-09 ENCOUNTER — Other Ambulatory Visit: Payer: Self-pay

## 2021-04-09 ENCOUNTER — Other Ambulatory Visit: Payer: Self-pay | Admitting: Internal Medicine

## 2021-04-09 VITALS — BP 128/84 | HR 72 | Temp 98.2°F | Resp 18 | Ht 67.0 in | Wt 150.0 lb

## 2021-04-09 DIAGNOSIS — R238 Other skin changes: Secondary | ICD-10-CM

## 2021-04-09 DIAGNOSIS — L989 Disorder of the skin and subcutaneous tissue, unspecified: Secondary | ICD-10-CM

## 2021-04-09 DIAGNOSIS — L309 Dermatitis, unspecified: Secondary | ICD-10-CM

## 2021-04-09 NOTE — Patient Instructions (Signed)
Dr. Wynetta Emery actually started a referral for you to be seen by dermatology already. ? ?I do encourage you to try using warm compresses on the bump to see if this offers you some relief. ? ?Kennieth Rad, PA-C ?Physician Assistant ?[place] ?[email or web address] ? ? ?Acne ?Acne is a skin problem that causes pimples and other skin changes. The skin has many tiny openings called pores. Each pore contains an oil gland. Oil glands make an oily substance that is called sebum. Acne occurs when the pores in the skin get blocked. The pores may become infected with bacteria, or they may become red, sore, and swollen. Acne is a common skin problem, especially for teenagers. It often occurs on the face, neck, chest, upper arms, and back. Acne usually goes away over time. ?What are the causes? ?Acne is caused when oil glands get blocked with sebum, dead skin cells, and dirt. The bacteria that are normally found in the oil glands then multiply and cause inflammation. ?Acne is commonly triggered by changes in your hormones. These hormonal changes can cause the oil glands to get bigger and to make more sebum. Factors that can make acne worse include: ?Hormone changes during: ?Adolescence. ?Women's menstrual cycles. ?Pregnancy. ?Oil-based cosmetics and hair products. ?Stress. ?Hormone problems that are caused by certain diseases. ?Certain medicines. ?Pressure from headbands, backpacks, or shoulder pads. ?Exposure to certain oils and chemicals. ?Eating a diet high in carbohydrates that quickly turn to sugar. These include dairy products, desserts, and chocolates. ?What increases the risk? ?This condition is more likely to develop in: ?Teenagers. ?People who have a family history of acne. ?What are the signs or symptoms? ?Symptoms include: ?Small, red bumps (pimples or papules). ?Whiteheads. ?Blackheads. ?Small, pus-filled pimples (pustules). ?Big, red pimples or pustules that  feel tender. ?More severe acne can cause: ?An abscess. This is an infected area that contains a collection of pus. ?Cysts. These are hard, painful, fluid-filled sacs. ?Scars. These can happen after large pimples heal. ?How is this diagnosed? ?This condition is diagnosed with a medical history and physical exam. Blood tests may also be done. ?How is this treated? ?Treatment for this condition can vary depending on the severity of your acne. Treatment may include: ?Creams and lotions that prevent oil glands from clogging. ?Creams and lotions that treat or prevent infections and inflammation. ?Antibiotic medicines that are applied to the skin or taken as a pill. ?Pills that decrease sebum production. ?Birth control pills. ?Light or laser treatments. ?Injections of medicine into the affected areas. ?Chemicals that cause peeling of the skin. ?Surgery. ?Your health care provider will also recommend the best way to take care of your skin. Good skin care is the most important part of treatment. ?Follow these instructions at home: ?Skin care ?Take care of your skin as told by your health care provider. You may be told to do these things: ?Wash your skin gently at least two times each day, as well as: ?After you exercise. ?Before you go to bed. ?Use mild soap. ?Apply a water-based skin moisturizer after you wash your skin. ?Use a sunscreen or sunblock with SPF 30 or greater. This is especially important if you are using acne medicines. ?Choose cosmetics that will not block your oil glands (are noncomedogenic). ?Medicines ?Take over-the-counter and prescription medicines only as told by your health care provider. ?If you were prescribed an antibiotic medicine, apply it or take it as told by your health care provider. Do not stop using the antibiotic even  if your condition improves. ?General instructions ?Keep your hair clean and off your face. If you have oily hair, shampoo your hair regularly or daily. ?Avoid wearing tight  headbands or hats. ?Avoid picking or squeezing your pimples. That can make your acne worse and cause scarring. ?Shave gently and only when necessary. ?Keep a food journal to figure out if any foods are linked to your acne. Avoid dairy products, desserts, and chocolates. ?Take steps to manage and reduce stress. ?Keep all follow-up visits as told by your health care provider. This is important. ?Contact a health care provider if: ?Your acne is not better after eight weeks. ?Your acne gets worse. ?You have a large area of skin that is red or tender. ?You think that you are having side effects from any acne medicine. ?Summary ?Acne is a skin problem that causes pimples and other skin changes. Acne is a common skin problem, especially for teenagers. Acne usually goes away over time. ?Acne is commonly triggered by changes in your hormones. There are many other causes, such as stress, diet, and certain medicines. ?Follow your health care provider's instructions for how to take care of your skin. Good skin care is the most important part of treatment. ?Take over-the-counter and prescription medicines only as told by your health care provider. ?Contact your health care provider if you think that you are having side effects from any acne medicine. ?This information is not intended to replace advice given to you by your health care provider. Make sure you discuss any questions you have with your health care provider. ?Document Revised: 06/08/2017 Document Reviewed: 06/08/2017 ?Elsevier Patient Education ? [place]. ? ?

## 2021-04-09 NOTE — Telephone Encounter (Signed)
?  Chief Complaint: bump ?Symptoms: bump on face below R ear that's dime sized and red  ?Frequency: ongoing for 6 months or more ?Pertinent Negatives: denies itching ?Disposition: [] ED /[] Urgent Care (no appt availability in office) / [] Appointment(In office/virtual)/ []  [facility]/ [] Home Care/ [] Refused Recommended Disposition /[x] [facility]/ []  Follow-up with PCP ?Additional Notes: pt has applied different OTC topicals but hasn't helped and now has white secretion with odor. Pt advised to go to [facility]  ? ? ?Reason for Disposition ? [1] Small swelling or lump AND [2] unexplained AND [3] present > 1 week ? ?Answer Assessment - Initial Assessment Questions ?1. APPEARANCE of SWELLING: "What does it look like?" (e.g., lymph node, insect bite, mole) ?    Red bump with possible white area  ?2. SIZE: "How large is the swelling?" (e.g., inches, cm; or compare to size of pinhead, tip of pen, eraser, coin, pea, grape, ping pong ball)  ?    Dime sized ?3. LOCATION: "Where is the swelling located?" ?    Below R ear ?4. ONSET: "When did the swelling start?" ?    Been there for 6 months ?5. PAIN: "Is it painful?" If Yes, ask: "How much?" ?    Tenderness when touching  ?6. ITCH: "Does it itch?" If Yes, ask: "How much?" ?    no ?8. OTHER SYMPTOMS: "Do you have any other symptoms?" (e.g., fever) ?    White discharge, odor ? ?Protocols used: Skin Lump or Localized Swelling-A-AH ? ?

## 2021-04-09 NOTE — Telephone Encounter (Signed)
Noted  

## 2021-04-09 NOTE — Progress Notes (Signed)
Established Patient Office Visit  Subjective:  Patient ID: Vickie Pearson, female    DOB: Jan 25, 1956  Age: 66 y.o. MRN: [id number]  CC:  Chief Complaint  Patient presents with   Acne    HPI Vickie Pearson states that she has had a bump on the right side of her face near her ear for the past 6 months.  States that it is slightly tender to touch on occasion, states that she has noticed an abnormal smell when it has what she believes is "possible discharge".  States that she has tried tea tree oil and witch hazel without relief.   Past Medical History:  Diagnosis Date   Colon polyps    Coronary artery calcification of native artery 02/21/2019   Minimal calcification noted 11/2018 on coronary CTA   Costochondritis    DDD (degenerative disc disease), cervical    Degenerative arthritis of knee    Bilateral   Degenerative disc disease, cervical    Degenerative disc disease, cervical    Fibromyalgia    GERD (gastroesophageal reflux disease)    Hyperlipidemia    Inappropriate sinus tachycardia 02/21/2019   Monoclonal gammopathy    Multiple thyroid nodules    Osteoporosis    Rheumatoid arthritis ([place])     Past Surgical History:  Procedure Laterality Date   BREAST BIOPSY Right 2022   COLONOSCOPY  2012   COLONOSCOPY  2015   POLYPECTOMY      Family History  Problem Relation Age of Onset   Hypertension Mother    Colon polyps Mother    Stroke Mother    Arthritis Mother    Hypertension Father    Heart disease Father    Arthritis Father    Kidney disease Father    Hypertension Sister    Hyperlipidemia Sister    Hypertension Brother    Hyperlipidemia Brother    Esophageal cancer Neg Hx    Rectal cancer Neg Hx    Stomach cancer Neg Hx    Osteoporosis Neg Hx     Social History   Socioeconomic History   Marital status: Widowed    Spouse name: Not on file   Number of children: Not on file   Years of education: Not on file   Highest education level:  Not on file  Occupational History   Not on file  Tobacco Use   Smoking status: Former    Packs/day: 1.00    Types: Cigarettes    Quit date: 07/24/1988    Years since quitting: 32.7   Smokeless tobacco: Never  Vaping Use   Vaping Use: Never used  Substance and Sexual Activity   Alcohol use: Yes    Comment: occasionally   Drug use: No   Sexual activity: Not on file  Other Topics Concern   Not on file  Social History Narrative   Not on file   Social Determinants of Health   Financial Resource Strain: Not on file  Food Insecurity: Not on file  Transportation Needs: Not on file  Physical Activity: Not on file  Stress: Not on file  Social Connections: Not on file  Intimate Partner Violence: Not on file    Outpatient Medications Prior to Visit  Medication Sig Dispense Refill   cholecalciferol (VITAMIN D) 1000 units tablet Take 3,000 Units by mouth daily.     Evolocumab with Infusor ([place]) 420 MG/3.5ML SOCT INJECT 1 APPLICATOR INTO THE SKIN EVERY 30 DAYS 3.5 mL 11   ezetimibe (  ZETIA) 10 MG tablet Take 1 tablet (10 mg total) by mouth daily. 90 tablet 3   fluticasone (FLONASE) 50 MCG/ACT nasal spray Place 1 spray into both nostrils as needed for allergies or rhinitis.     hydrOXYzine (VISTARIL) 25 MG capsule Take 1 capsule (25 mg total) by mouth 2 (two) times daily as needed. 30 capsule 1   Lactobacillus (PROBIOTIC ACIDOPHILUS PO) Take 1 capsule by mouth daily.     leflunomide (ARAVA) 10 MG tablet Take 1 tablet (10 mg total) by mouth daily. 30 tablet 2   Magnesium Citrate 100 MG TABS Take by mouth daily as needed.      metoprolol succinate (TOPROL-XL) 25 MG 24 hr tablet TAKE 1 TABLET BY MOUTH ONCE DAILY WITH  OR  IMMEDIATELY  FOLLOWING  A  MEAL 90 tablet 3   vitamin B-12 (CYANOCOBALAMIN) 1000 MCG tablet Take 1 tablet by mouth daily.     zinc sulfate 220 (50 Zn) MG capsule Take by mouth daily.      sertraline (ZOLOFT) 50 MG tablet 1/2 tab PO daily x 4 weeks then  1 tab daily 30 tablet 1   No facility-administered medications prior to visit.    Allergies  Allergen Reactions   Penicillins Hives    Has patient had a PCN reaction causing immediate rash, facial/tongue/throat swelling, SOB or lightheadedness with hypotension: No Has patient had a PCN reaction causing severe rash involving mucus membranes or skin necrosis: Yes Has patient had a PCN reaction that required hospitalization: No Has patient had a PCN reaction occurring within the last 10 years: No If all of the above answers are "NO", then may proceed with Cephalosporin use.    Piroxicam Hives   Sulfa Antibiotics Hives    ROS Review of Systems  Constitutional: Negative.   HENT: Negative.    Eyes: Negative.   Respiratory:  Negative for shortness of breath.   Cardiovascular:  Negative for chest pain.  Gastrointestinal: Negative.   Endocrine: Negative.   Genitourinary: Negative.   Musculoskeletal: Negative.   Skin: Negative.   Allergic/Immunologic: Negative.   Neurological: Negative.   Hematological: Negative.   Psychiatric/Behavioral: Negative.       Objective:    Physical Exam Vitals and nursing note reviewed.  Constitutional:      Appearance: Normal appearance.  HENT:     Head: Normocephalic and atraumatic.     Right Ear: External ear normal.     Left Ear: External ear normal.     Nose: Nose normal.     Mouth/Throat:     Mouth: Mucous membranes are moist.     Pharynx: Oropharynx is clear.  Eyes:     Extraocular Movements: Extraocular movements intact.     Conjunctiva/sclera: Conjunctivae normal.     Pupils: Pupils are equal, round, and reactive to light.  Cardiovascular:     Rate and Rhythm: Normal rate and regular rhythm.     Pulses: Normal pulses.     Heart sounds: Normal heart sounds.  Pulmonary:     Effort: Pulmonary effort is normal.     Breath sounds: Normal breath sounds.  Musculoskeletal:        General: Normal range of motion.     Cervical back:  Normal range of motion and neck supple.  Skin:    General: Skin is warm and dry.     Comments: See photo  Neurological:     General: No focal deficit present.     Mental Status: She is alert  and oriented to person, place, and time.  Psychiatric:        Behavior: Behavior normal.        Thought Content: Thought content normal.        Judgment: Judgment normal.   Verbal consent given by patient to have photo included in chart   BP 128/84 (BP Location: Left Arm, Patient Position: Sitting, Cuff Size: Normal)    Pulse 72    Temp 98.2 F (36.8 C) (Oral)    Resp 18    Ht 5' 7" (1.702 m)    Wt 150 lb (68 kg)    SpO2 98%    BMI 23.49 kg/m  Wt Readings from Last 3 Encounters:  04/09/21 150 lb (68 kg)  02/20/21 152 lb 14.4 oz (69.4 kg)  12/03/20 152 lb 9.6 oz (69.2 kg)     Health Maintenance Due  Topic Date Due   TETANUS/TDAP  Never done   Zoster Vaccines- Shingrix (1 of 2) Never done   Pneumonia Vaccine 65+ Years old (1 - PCV) Never done   COVID-19 Vaccine (4 - Booster for Pfizer series) 01/12/2020   INFLUENZA VACCINE  09/09/2020    There are no preventive care reminders to display for this patient.  Lab Results  Component Value Date   TSH 0.93 10/02/2020   Lab Results  Component Value Date   WBC 5.5 02/20/2021   HGB 13.1 02/20/2021   HCT 39.3 02/20/2021   MCV 93.8 02/20/2021   PLT 235 02/20/2021   Lab Results  Component Value Date   NA 141 02/20/2021   K 3.7 02/20/2021   CHLORIDE 108 02/11/2017   CO2 26 02/20/2021   GLUCOSE 76 02/20/2021   BUN 14 02/20/2021   CREATININE 0.86 02/20/2021   BILITOT 0.4 02/20/2021   ALKPHOS 64 02/20/2021   AST 14 (L) 02/20/2021   ALT 8 02/20/2021   PROT 7.0 02/20/2021   ALBUMIN 3.9 02/20/2021   CALCIUM 9.3 02/20/2021   ANIONGAP 6 02/20/2021   EGFR 77 09/30/2020   GFR 67.45 10/02/2020   Lab Results  Component Value Date   CHOL 205 (H) 09/30/2020   Lab Results  Component Value Date   HDL 65 09/30/2020   Lab Results   Component Value Date   LDLCALC 121 (H) 09/30/2020   Lab Results  Component Value Date   TRIG 107 09/30/2020   Lab Results  Component Value Date   CHOLHDL 3.2 09/30/2020   No results found for: HGBA1C    Assessment & Plan:   Problem List Items Addressed This Visit   None Visit Diagnoses     Papule    -  Primary   Facial skin lesion           No orders of the defined types were placed in this encounter. 1. Papule [place] versus fatty lipoma.  Patient education given on supportive care.  Primary care provider had already started referral for patient to be seen by dermatology.  Patient understands and agrees.  Red flags given for prompt reevaluation.  2. Facial skin lesion    I have reviewed the patient's medical history (PMH, PSH, Social History, Family History, Medications, and allergies) , and have been updated if relevant. I spent 10 minutes reviewing chart and  face to face time with patient.     Follow-up: Return if symptoms worsen or fail to improve.    Loraine Grip Mayers, PA-C

## 2021-04-10 ENCOUNTER — Encounter: Payer: Self-pay | Admitting: Physician Assistant

## 2021-04-15 ENCOUNTER — Ambulatory Visit (INDEPENDENT_AMBULATORY_CARE_PROVIDER_SITE_OTHER): Payer: Medicare Other | Admitting: Clinical

## 2021-04-15 ENCOUNTER — Other Ambulatory Visit: Payer: Self-pay

## 2021-04-15 DIAGNOSIS — F411 Generalized anxiety disorder: Secondary | ICD-10-CM

## 2021-04-16 NOTE — BH Specialist Note (Signed)
Integrated Behavioral Health Follow Up In-Person Visit ? ?MRN: [id number] ?Name: Vickie Pearson ? ?Number of [facility] Clinician visits: Additional Visit ? ?Session Start time: 7412 ?  ?Session End time: 8786 ? ?Total time in minutes: 30 ? ? ?Types of Service: Individual psychotherapy ? ?Interpretor:No. Interpretor Name and Language: N/A ? ?Subjective: ?Vickie Pearson is a 66 y.o. female accompanied by  self ?Patient was referred by Karle Plumber, MD for grief. ?Patient reports the following symptoms/concerns: Reports that she has felt depressed and anxious lately. Reports that she recently received the autopsy report on her brother which ruled his death a suicide. Reports that she does not believe he died by suicide and believes someone took his life.  ?Duration of problem: 7 months; Severity of problem: moderate ? ?Objective: ?Mood: Anxious and Depressed and Affect: Appropriate ?Risk of harm to self or others: No plan to harm self or others ? ?Life Context: ?Family and Social: Reports that she lives alone. Reports her support system as friends and her daughter. Reports that majority of her family does not live in [place]. Reports that her husband is deceased.  ?School/Work: Pt is retired.  ?Self-Care: Reports prayer, church, and spending time with friends as coping skills.  ?Life Changes: Pt's brother passed away four months ago. Pt has received the autopsy report on her brother which ruled his death a suicide.  ? ?Patient and/or Family's Strengths/Protective Factors: ?Social and Emotional competence and Concrete supports in place (healthy food, safe environments, etc.) ? ?Goals Addressed: ?Patient will: ? Reduce symptoms of: anxiety and depression  ? Increase knowledge and/or ability of: coping skills  ? Demonstrate ability to: Increase healthy adjustment to current life circumstances and Begin healthy grieving over loss ? ?Progress towards  Goals: ?Ongoing ? ?Interventions: ?Interventions utilized:  CBT Cognitive Behavioral Therapy and Supportive Counseling ?Standardized Assessments completed: GAD-7 and PHQ 9 ?GAD 7 : Generalized Anxiety Score 04/15/2021 03/18/2021 02/25/2021 12/31/2020  ?Nervous, Anxious, on Edge 0 0 1 1  ?Control/stop worrying 1 0 0 1  ?Worry too much - different things 0 '1 1 1  '$ ?Trouble relaxing 0 0 0 0  ?Restless 0 0 0 0  ?Easily annoyed or irritable 0 0 0 0  ?Afraid - awful might happen 0 0 0 0  ?Total GAD 7 Score '1 1 2 3  '$ ?Anxiety Difficulty - - - Not difficult at all  ? ?  ?Depression screen [facility] 2/9 04/15/2021 03/18/2021 02/25/2021 12/31/2020 12/10/2020  ?Decreased Interest 0 0 0 0 0  ?Down, Depressed, Hopeless 0 0 0 0 0  ?PHQ - 2 Score 0 0 0 0 0  ?Altered sleeping '1 1 1 1 1  '$ ?Tired, decreased energy 1 0 0 1 0  ?Change in appetite 0 0 0 0 1  ?Feeling bad or failure about yourself  0 0 0 0 0  ?Trouble concentrating 0 0 0 0 0  ?Moving slowly or fidgety/restless 0 0 0 0 0  ?Suicidal thoughts 0 0 0 0 0  ?PHQ-9 Score '2 1 1 2 2  '$ ?Difficult doing work/chores - - - Not difficult at all -  ?Some recent data might be hidden  ?  ?Patient and/or Family Response: Pt receptive to tx. Pt receptive to psychoeducation provided on grief and suicide. Pt receptive to cognitive restructuring. Pt receptive to continuing to process her brother's death now that she has received the autopsy report. ? ?Patient Centered Plan: ?Patient is on the following Treatment Plan(s): Grief ? ?Assessment: ?Denies  SI/HI. Patient currently experiencing grief related to the loss of her brother. Pt has received her brother's autopsy report which ruled his death a suicide. Pt is having difficulty processing her brother's death and believes that someone took his life.  ? ?Patient may benefit from CBT. LCSW provided psychoeducation on grief. LCSW attempted to normalize pt's difficulty with processing the cause of death. LCSW utilized Adult nurse. LCSW will continue to  process pt's brother's death with her. ? ?Plan: ?Follow up with behavioral health clinician on : 05/06/21 ?Behavioral recommendations: FU with LCSW ?Referral(s): [facility] (In Clinic) ?"From scale of 1-10, how likely are you to follow plan?": 10 ? ?Brallan Denio C Halea Lieb, LCSW ? ? ?

## 2021-05-06 ENCOUNTER — Other Ambulatory Visit: Payer: Self-pay

## 2021-05-06 ENCOUNTER — Ambulatory Visit (INDEPENDENT_AMBULATORY_CARE_PROVIDER_SITE_OTHER): Payer: Medicare Other | Admitting: Clinical

## 2021-05-06 DIAGNOSIS — F411 Generalized anxiety disorder: Secondary | ICD-10-CM | POA: Diagnosis not present

## 2021-05-14 NOTE — BH Specialist Note (Signed)
Integrated Behavioral Health Follow Up In-Person Visit ? ?MRN: [id number] ?Name: Vickie Pearson ? ?Number of [facility] Clinician visits: Additional Visit ? ?Session Start time: 1000 ?  ?Session End time: 7867 ? ?Total time in minutes: 35 ? ? ?Types of Service: Individual psychotherapy ? ?Interpretor:No. Interpretor Name and Language: N/A ? ?Subjective: ?Vickie Pearson is a 66 y.o. female accompanied by  self ?Patient was referred by Karle Plumber, MD for grief . ?Patient reports the following symptoms/concerns: Reports that she continues to worry about her brother's death being solved. Reports that she does not believe that he died by suicide. Reports that she is currently not speaking to her sister in law so she is receiving information about her brother's case from her nephew.  ?Duration of problem: 7 months; Severity of problem: moderate ? ?Objective: ?Mood: Anxious and Depressed and Affect: Appropriate ?Risk of harm to self or others: No plan to harm self or others ? ?Life Context: ?Family and Social: Reports that she lives alone. Reports her support system as friends and her daughter. Reports that majority of her family does not live in [place]. Reports that her husband is deceased.  ?School/Work: Pt is retired.  ?Self-Care: Reports prayer, church, and spending time with friends as coping skills.  ?Life Changes: Pt's brother passed away four months ago. Pt has received the autopsy report on her brother which ruled his death a suicide.  ?(No changes to life context) ? ?Patient and/or Family's Strengths/Protective Factors: ?Social and Emotional competence and Concrete supports in place (healthy food, safe environments, etc.) ? ?Goals Addressed: ?Patient will: ? Reduce symptoms of: anxiety and depression  ? Increase knowledge and/or ability of: coping skills  ? Demonstrate ability to: Increase healthy adjustment to current life circumstances and Begin healthy grieving over  loss ? ?Progress towards Goals: ?Ongoing ? ?Interventions: ?Interventions utilized:  CBT Cognitive Behavioral Therapy and Supportive Counseling ?Standardized Assessments completed: GAD-7 and PHQ 9 ? ?  05/06/2021  ? 10:09 AM 04/15/2021  ? 10:19 AM 03/18/2021  ?  2:00 PM 02/25/2021  ? 10:46 AM  ?GAD 7 : Generalized Anxiety Score  ?Nervous, Anxious, on Edge 0 0 0 1  ?Control/stop worrying 1 1 0 0  ?Worry too much - different things 0 0 1 1  ?Trouble relaxing 0 0 0 0  ?Restless 0 0 0 0  ?Easily annoyed or irritable 0 0 0 0  ?Afraid - awful might happen 0 0 0 0  ?Total GAD 7 Score '1 1 1 2  '$ ? ?  ? ?  05/06/2021  ? 10:09 AM 04/15/2021  ? 10:19 AM 03/18/2021  ?  2:00 PM 02/25/2021  ? 10:45 AM 12/31/2020  ? 12:23 PM  ?Depression screen PHQ 2/9  ?Decreased Interest 0 0 0 0 0  ?Down, Depressed, Hopeless 0 0 0 0 0  ?PHQ - 2 Score 0 0 0 0 0  ?Altered sleeping '1 1 1 1 1  '$ ?Tired, decreased energy 0 1 0 0 1  ?Change in appetite 1 0 0 0 0  ?Feeling bad or failure about yourself  0 0 0 0 0  ?Trouble concentrating 0 0 0 0 0  ?Moving slowly or fidgety/restless 0 0 0 0 0  ?Suicidal thoughts 0 0 0 0 0  ?PHQ-9 Score '2 2 1 1 2  '$ ?Difficult doing work/chores     Not difficult at all  ?  ?Patient and/or Family Response: Pt receptive to tx. Pt receptive to psychoeducation provided on  grief and suicide. Pt receptive to cognitive restructuring and processing her emotions related to the autopsy report. ? ?Patient Centered Plan: ?Patient is on the following Treatment Plan(s): Grief ? ?Assessment: ?Denies SI/HI. Patient currently experiencing grief related to the death of her brother. Pt is having difficulty processing her brother's cause of death. Pt appears to experience racing thoughts related to her brother's cause of death.  ? ?Patient may benefit from ongoing CBT. LCSW provided psychoeducation on grief. LCSW utilized cognitive restructuring and assisted pt in processing her emotions related to her brother's cause of death. LCSW encouraged pt to  incorporate enjoyable activities. ? ?Plan: ?Follow up with behavioral health clinician on : 05/27/21 ?Behavioral recommendations: Incorporate enjoyable activities and utilize deep breathing.  ?Referral(s): [facility] (In Clinic) ?"From scale of 1-10, how likely are you to follow plan?": 10 ? ?Elanore Talcott C Alvira Hecht, LCSW ? ? ?

## 2021-05-26 NOTE — Progress Notes (Signed)
? ?Office Visit Note ? ?Patient: Vickie Pearson             ?Date of Birth: [birth year]           ?MRN: [id number]             ?PCP: Ladell Pier, MD ?Referring: Ladell Pier, MD ?Visit Date: 05/29/2021 ?Occupation: '@GUAROCC'$ @ ? ?Subjective:  ?Joint stiffness and pain ? ?History of Present Illness: Vickie Pearson is a 66 y.o. female with history of seronegative rheumatoid arthritis and osteoarthritis.  She returns today after her last visit in October 2022.  According to patient she did not restart leflunomide after the last visit.  She continues to have some discomfort in her wrist joints and her knee joints.  She does not notice much swelling.  She had physical therapy for her lower back which was helpful.  Her neck is currently not hurting.  She is under treatment of Dr. Loanne Drilling for osteoporosis.  She states she had Reclast in 2017 and 2022.  She has been taking calcium and vitamin D on a regular basis. ? ?Activities of Daily Living:  ?Patient reports morning stiffness for 10-15 minutes.   ?Patient Reports nocturnal pain.  ?Difficulty dressing/grooming: Denies ?Difficulty climbing stairs: Denies ?Difficulty getting out of chair: Denies ?Difficulty using hands for taps, buttons, cutlery, and/or writing: Denies ? ?Review of Systems  ?Constitutional:  Negative for fatigue.  ?HENT:  Negative for mouth sores, mouth dryness and nose dryness.   ?Eyes:  Positive for pain, itching and dryness.  ?Respiratory:  Negative for shortness of breath and difficulty breathing.   ?Cardiovascular:  Positive for chest pain and palpitations.  ?Gastrointestinal:  Negative for blood in stool, constipation and diarrhea.  ?Endocrine: Negative for increased urination.  ?Genitourinary:  Negative for difficulty urinating.  ?Musculoskeletal:  Positive for joint pain, joint pain, joint swelling and morning stiffness. Negative for myalgias, muscle tenderness and myalgias.  ?Skin:  Negative for color change, rash and  redness.  ?Allergic/Immunologic: Negative for susceptible to infections.  ?Neurological:  Positive for dizziness and headaches. Negative for numbness, memory loss and weakness.  ?Hematological:  Positive for bruising/bleeding tendency.  ?Psychiatric/Behavioral:  Negative for confusion.   ? ?PMFS History:  ?Patient Active Problem List  ? Diagnosis Date Noted  ? Immunosuppression ([place]) 04/21/2019  ? Coronary artery calcification of native artery 02/21/2019  ? Inappropriate sinus tachycardia 02/21/2019  ? Other chest pain 03/01/2017  ? Rheumatoid arthritis involving multiple sites ([place]) 07/24/2016  ? Cervical radiculopathy 07/24/2016  ? Familial hyperlipidemia 07/24/2016  ? Osteoporosis 07/24/2016  ? Vitamin D deficiency 07/24/2016  ? Multiple thyroid nodules 07/24/2016  ? MGUS (monoclonal gammopathy of unknown significance) 07/24/2016  ? Colon polyps 07/24/2016  ? 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 Hx of abnormal cervical Pap smear 07/24/2016  ? OA (osteoarthritis) of knee 07/24/2016  ?  ?Past Medical History:  ?Diagnosis Date  ? Colon polyps   ? Coronary artery calcification of native artery 02/21/2019  ? Minimal calcification noted 11/2018 on coronary CTA  ? Costochondritis   ? DDD (degenerative disc disease), cervical   ? Degenerative arthritis of knee   ? Bilateral  ? Degenerative disc disease, cervical   ? Degenerative disc disease, cervical   ? Fibromyalgia   ? GERD (gastroesophageal reflux disease)   ? Hyperlipidemia   ? Inappropriate sinus tachycardia 02/21/2019  ? Monoclonal gammopathy   ? Multiple thyroid nodules   ? Osteoporosis   ? Rheumatoid arthritis ([place])   ?  ?  Family History  ?Problem Relation Age of Onset  ? Hypertension Mother   ? Colon polyps Mother   ? Stroke Mother   ? Arthritis Mother   ? Hypertension Father   ? Heart disease Father   ? Arthritis Father   ? Kidney disease Father   ? Hypertension Sister   ? Hyperlipidemia Sister   ? Hypertension Brother   ? Hyperlipidemia Brother   ?  Esophageal cancer Neg Hx   ? Rectal cancer Neg Hx   ? Stomach cancer Neg Hx   ? Osteoporosis Neg Hx   ? ?Past Surgical History:  ?Procedure Laterality Date  ? BREAST BIOPSY Right 2022  ? COLONOSCOPY  2012  ? COLONOSCOPY  2015  ? POLYPECTOMY    ? ?Social History  ? ?Social History Narrative  ? Not on file  ? ?Immunization History  ?Administered Date(s) Administered  ? Fluad Quad(high Dose 65+) 02/21/2020  ? Influenza, High Dose Seasonal PF 11/10/2018  ? Influenza, Quadrivalent, Recombinant, Inj, Pf 11/15/2017  ? Influenza,inj,Quad PF,6+ Mos 11/09/2016, 11/10/2018  ? PFIZER(Purple Top)SARS-COV-2 Vaccination 03/17/2019, 04/07/2019, 11/17/2019  ?  ? ?Objective: ?Vital Signs: BP 132/81 (BP Location: Left Arm, Patient Position: Sitting, Cuff Size: Normal)   Pulse 80   Ht '5\' 7"'$  (1.702 m)   Wt 152 lb 12.8 oz (69.3 kg)   BMI 23.93 kg/m?   ? ?Physical Exam ?Vitals and nursing note reviewed.  ?Constitutional:   ?   Appearance: She is well-developed.  ?HENT:  ?   Head: Normocephalic and atraumatic.  ?Eyes:  ?   Conjunctiva/sclera: Conjunctivae normal.  ?Cardiovascular:  ?   Rate and Rhythm: Normal rate and regular rhythm.  ?   Heart sounds: Normal heart sounds.  ?Pulmonary:  ?   Effort: Pulmonary effort is normal.  ?   Breath sounds: Normal breath sounds.  ?Abdominal:  ?   General: Bowel sounds are normal.  ?   Palpations: Abdomen is soft.  ?Musculoskeletal:  ?   Cervical back: Normal range of motion.  ?Lymphadenopathy:  ?   Cervical: No cervical adenopathy.  ?Skin: ?   General: Skin is warm and dry.  ?   Capillary Refill: Capillary refill takes less than 2 seconds.  ?Neurological:  ?   Mental Status: She is alert and oriented to person, place, and time.  ?Psychiatric:     ?   Behavior: Behavior normal.  ?  ? ?Musculoskeletal Exam: C-spine was in good range of motion.  Shoulder joints and elbow joints with good range of motion.  She had limited range of motion of bilateral wrist joints with synovitis.  Synovitis was also  noted over some of the MCPs as described below.  She had limited range of motion of her hip joints and limited extension of her knee joints with warmth in her knee joints.  There was no tenderness over ankles or MTPs. ? ?CDAI Exam: ?CDAI Score: 8.7  ?Patient Global: 3 mm; Provider Global: 4 mm ?Swollen: 3 ; Tender: 5  ?Joint Exam 05/29/2021  ? ?   Right  Left  ?Wrist  Swollen Tender  Swollen Tender  ?MCP 2  Swollen Tender     ?Knee   Tender   Tender  ? ? ? ?Investigation: ?No additional findings. ? ?Imaging: ?No results found. ? ?Recent Labs: ?Lab Results  ?Component Value Date  ? WBC 5.5 02/20/2021  ? HGB 13.1 02/20/2021  ? PLT 235 02/20/2021  ? NA 141 02/20/2021  ? K 3.7 02/20/2021  ?  CL 109 02/20/2021  ? CO2 26 02/20/2021  ? GLUCOSE 76 02/20/2021  ? BUN 14 02/20/2021  ? CREATININE 0.86 02/20/2021  ? BILITOT 0.4 02/20/2021  ? ALKPHOS 64 02/20/2021  ? AST 14 (L) 02/20/2021  ? ALT 8 02/20/2021  ? PROT 7.0 02/20/2021  ? ALBUMIN 3.9 02/20/2021  ? CALCIUM 9.3 02/20/2021  ? GFRAA 78 07/02/2020  ? ? ?Speciality Comments: No specialty comments available. ? ?Procedures:  ?Large Joint Inj: bilateral knee on 05/29/2021 2:51 PM ?Indications: pain ?Details: 27 G 1.5 in needle, medial approach ? ?Arthrogram: No ? ?Medications (Right): 1.5 mL lidocaine 1 %; 40 mg triamcinolone acetonide 40 MG/ML ?Medications (Left): 1.5 mL lidocaine 1 %; 40 mg triamcinolone acetonide 40 MG/ML ?Outcome: tolerated well, no immediate complications ?Procedure, treatment alternatives, risks and benefits explained, specific risks discussed. Consent was given by the patient. Immediately prior to procedure a time out was called to verify the correct patient, procedure, equipment, support staff and site/side marked as required. Patient was prepped and draped in the usual sterile fashion.  ? ? ?Allergies: Penicillins, Piroxicam, and Sulfa antibiotics  ? ?Assessment / Plan:     ?Visit Diagnoses: Rheumatoid arthritis of multiple sites with negative  rheumatoid factor (HCC)-patient with severe erosive rheumatoid arthritis involving multiple joints.  I reviewed her x-rays with her from 2018 of bilateral hands, bilateral knee joints and bilateral feet.  She was given a pre

## 2021-05-27 ENCOUNTER — Ambulatory Visit (INDEPENDENT_AMBULATORY_CARE_PROVIDER_SITE_OTHER): Payer: Medicare Other | Admitting: Clinical

## 2021-05-27 DIAGNOSIS — F411 Generalized anxiety disorder: Secondary | ICD-10-CM | POA: Diagnosis not present

## 2021-05-27 NOTE — BH Specialist Note (Signed)
Integrated Behavioral Health Follow Up In-Person Visit ? ?MRN: [id number] ?Name: Vickie Pearson ? ?Number of [facility] Clinician visits: Additional Visit ? ?Session Start time: 4401 ?  ?Session End time: 0272 ? ?Total time in minutes: 30 ? ? ?Types of Service: Individual psychotherapy ? ?Interpretor:No. Interpretor Name and Language: N/A ? ?Subjective: ?Vickie Pearson is a 66 y.o. female accompanied by  self ?Patient was referred by Vickie Plumber, MD for grief. ?Patient reports the following symptoms/concerns: Reports experiencing ups and downs. Reports that she continues to not speak to her sister in law and is still processing her brother's death.  ?Duration of problem: 7 months; Severity of problem: moderate ? ?Objective: ?Mood: Anxious and Depressed and Affect: Appropriate ?Risk of harm to self or others: No plan to harm self or others ? ?Life Context: ?Family and Social: Reports that she lives alone. Reports her support system as friends and her daughter. Reports that majority of her family does not live in [place]. Reports that her husband is deceased.  ?School/Work: Pt is retired.  ?Self-Care: Reports prayer, church, and spending time with friends as coping skills.  ?Life Changes: Pt's brother passed away four months ago. Pt has received the autopsy report on her brother which ruled his death a suicide.  ?(No changes to life context) ? ?Patient and/or Family's Strengths/Protective Factors: ?Social and Emotional competence and [place] supports in place (healthy food, safe environments, etc.) ? ?Goals Addressed: ?Patient will: ? Reduce symptoms of: anxiety and depression  ? Increase knowledge and/or ability of: coping skills  ? Demonstrate ability to: Increase healthy adjustment to current life circumstances ? ?Progress towards Goals: ?Ongoing ? ?Interventions: ?Interventions utilized:  CBT Cognitive Behavioral Therapy and Supportive Counseling ?Standardized Assessments  completed: GAD-7 and PHQ 9 ? ?  05/27/2021  ? 10:17 AM 05/06/2021  ? 10:09 AM 04/15/2021  ? 10:19 AM 03/18/2021  ?  2:00 PM  ?GAD 7 : Generalized Anxiety Score  ?Nervous, Anxious, on Edge 0 0 0 0  ?Control/stop worrying '1 1 1 '$ 0  ?Worry too much - different things 1 0 0 1  ?Trouble relaxing 0 0 0 0  ?Restless 0 0 0 0  ?Easily annoyed or irritable 0 0 0 0  ?Afraid - awful might happen 0 0 0 0  ?Total GAD 7 Score '2 1 1 1  '$ ? ?  ? ?  05/27/2021  ? 10:18 AM 05/06/2021  ? 10:09 AM 04/15/2021  ? 10:19 AM 03/18/2021  ?  2:00 PM 02/25/2021  ? 10:45 AM  ?Depression screen PHQ 2/9  ?Decreased Interest 0 0 0 0 0  ?Down, Depressed, Hopeless 0 0 0 0 0  ?PHQ - 2 Score 0 0 0 0 0  ?Altered sleeping '1 1 1 1 1  '$ ?Tired, decreased energy 0 0 1 0 0  ?Change in appetite 1 1 0 0 0  ?Feeling bad or failure about yourself  0 0 0 0 0  ?Trouble concentrating 0 0 0 0 0  ?Moving slowly or fidgety/restless 0 0 0 0 0  ?Suicidal thoughts 0 0 0 0 0  ?PHQ-9 Score '2 2 2 1 1  '$ ?  ?Patient and/or Family Response: Pt receptive to cognitive restructuring. Pt will continue engaging in healthy coping skills with spending time with friends, going to church, and listening to music.  ? ?Patient Centered Plan: ?Patient is on the following Treatment Plan(s): Grief ? ?Assessment: ?Denies SI/HI. Patient currently experiencing grief related to the death of her  brother. Pt is continuing to process her brother's death. Pt is also processing her currently not speaking to her sister-in law.  ? ?Patient may benefit from engaging in healthy coping skills and CBT. LCSW utilized Adult nurse. LCSW encouraged pt to continue healthy coping skills. LCSW previously processed with pt her departure from [facility]. LCSW provided pt with counseling resources whom accept pt's insurance and encouraged pt to schedule an appt. ? ?Plan: ?Follow up with behavioral health clinician on : Schedule an appt for therapy utilizing counseling resources. ?Behavioral recommendations: Continue healthy  coping skills.  ?Referral(s): Counselor ?"From scale of 1-10, how likely are you to follow plan?": 10 ? ?Ulice Follett C Tenleigh Byer, LCSW ? ? ?

## 2021-05-29 ENCOUNTER — Ambulatory Visit (INDEPENDENT_AMBULATORY_CARE_PROVIDER_SITE_OTHER): Payer: Medicare Other | Admitting: Rheumatology

## 2021-05-29 ENCOUNTER — Encounter: Payer: Self-pay | Admitting: Rheumatology

## 2021-05-29 VITALS — BP 132/81 | HR 80 | Ht 67.0 in | Wt 152.8 lb

## 2021-05-29 DIAGNOSIS — H2013 Chronic iridocyclitis, bilateral: Secondary | ICD-10-CM | POA: Diagnosis not present

## 2021-05-29 DIAGNOSIS — Z79899 Other long term (current) drug therapy: Secondary | ICD-10-CM

## 2021-05-29 DIAGNOSIS — M0609 Rheumatoid arthritis without rheumatoid factor, multiple sites: Secondary | ICD-10-CM | POA: Diagnosis not present

## 2021-05-29 DIAGNOSIS — M25561 Pain in right knee: Secondary | ICD-10-CM

## 2021-05-29 DIAGNOSIS — M170 Bilateral primary osteoarthritis of knee: Secondary | ICD-10-CM

## 2021-05-29 DIAGNOSIS — D472 Monoclonal gammopathy: Secondary | ICD-10-CM

## 2021-05-29 DIAGNOSIS — G8929 Other chronic pain: Secondary | ICD-10-CM

## 2021-05-29 DIAGNOSIS — E782 Mixed hyperlipidemia: Secondary | ICD-10-CM

## 2021-05-29 DIAGNOSIS — M25562 Pain in left knee: Secondary | ICD-10-CM | POA: Diagnosis not present

## 2021-05-29 DIAGNOSIS — M51369 Other intervertebral disc degeneration, lumbar region without mention of lumbar back pain or lower extremity pain: Secondary | ICD-10-CM

## 2021-05-29 DIAGNOSIS — M5030 Other cervical disc degeneration, unspecified cervical region: Secondary | ICD-10-CM

## 2021-05-29 DIAGNOSIS — E042 Nontoxic multinodular goiter: Secondary | ICD-10-CM

## 2021-05-29 DIAGNOSIS — M810 Age-related osteoporosis without current pathological fracture: Secondary | ICD-10-CM

## 2021-05-29 DIAGNOSIS — M5136 Other intervertebral disc degeneration, lumbar region: Secondary | ICD-10-CM

## 2021-05-29 DIAGNOSIS — E559 Vitamin D deficiency, unspecified: Secondary | ICD-10-CM

## 2021-05-29 MED ORDER — TRIAMCINOLONE ACETONIDE 40 MG/ML IJ SUSP
40.0000 mg | INTRAMUSCULAR | Status: AC | PRN
  Administered 2021-05-29: 40 mg via INTRA_ARTICULAR

## 2021-05-29 MED ORDER — LIDOCAINE HCL 1 % IJ SOLN
1.5000 mL | INTRAMUSCULAR | Status: AC | PRN
  Administered 2021-05-29: 1.5 mL

## 2021-05-29 NOTE — Progress Notes (Signed)
? ? ?Cardiology Office Note ? ? ?Date:  06/02/2021  ? ?ID:  Vickie Pearson, DOB [birth year], MRN [id number] ? ?PCP:  Ladell Pier, MD  ?Cardiologist:   Skeet Latch, MD  ? ?No chief complaint on file. ? ?  ?History of Present Illness: ?OASIS GOEHRING is a 66 y.o. female with inappropriate sinus tachycardia, Rheumatoid arthritis, non-obstructive CAD, MGUS, GERD, fibromyalgia, and familial hyperlipidemia here for follow up. ? ?History of Present Illness:  ? ?Vickie Pearson was initially seen virtually 12/2018 with reports intermittent episodes of heart palpitations.  It was ongoing for the preceding 4 months but seemed to be happening more recently.  It feels like a fluttering sensation and occurs both at rest and with exertion.  Episode lasts between 15 and 20 seconds.  She sometimes feels lightheaded or dizzy.  Vickie Pearson was seen in the ED 11/24/2018 with chest pain and palpitations.  EKG did show 1 PVC but otherwise no significant arrhythmias.  Cardiac enzymes were negative.  She wore a 7-day event monitor that revealed no arrhythmias.  However she was noted to be tachycardic to the 140s at rest.  She reported atypical chest pain and had a coronary CT-a 02/2019 that revealed minimal obstruction.  Her calcium score was 50.3, which is 80th percentile for age and gender.  ?  ?Vickie Pearson's LDL was 251.  Her sister also has very high lipids. She reports that her diet is good. She tried taking pravastatin in the past but developed myalgias.  She was referred to the lipid clinic and started on Repatha.  Metoprolol was increased to 75 mg to help with palpitations. She followed-up with Vickie Montana, NP 09/2020 and was grieving the loss of her brother but, otherwise, well.  ? ?Today, she is doing well. She reports occasional sharp chest pain after lifting objects. She associates the chest pain to costochondritis. When she wakes up in the morning, she feels her heart "fluttering"  for a few seconds. She denies snoring or daytime somnolence.The fluttering occurs every few months. She is tolerating the Repatha. She discontinued Zetia due to LE cramping. For activity, she attends line dancing classes at the [facility] 3 times a week. Walking exacerbates pain in her bilateral knees. For diet, she avoid fried and fast food.and also enjoys cheese. She denies any shortness of breath, lightheadedness, headaches, syncope, orthopnea, PND, or lower extremity edema..  ? ?Past Medical History:  ?Diagnosis Date  ? Colon polyps   ? Coronary artery calcification of native artery 02/21/2019  ? Minimal calcification noted 11/2018 on coronary CTA  ? Costochondritis   ? DDD (degenerative disc disease), cervical   ? Degenerative arthritis of knee   ? Bilateral  ? Degenerative disc disease, cervical   ? Degenerative disc disease, cervical   ? Fibromyalgia   ? GERD (gastroesophageal reflux disease)   ? Hyperlipidemia   ? Inappropriate sinus tachycardia 02/21/2019  ? Monoclonal gammopathy   ? Multiple thyroid nodules   ? Osteoporosis   ? Rheumatoid arthritis ([place])   ? ? ?Past Surgical History:  ?Procedure Laterality Date  ? BREAST BIOPSY Right 2022  ? COLONOSCOPY  2012  ? COLONOSCOPY  2015  ? POLYPECTOMY    ? ? ? ?Current Outpatient Medications  ?Medication Sig Dispense Refill  ? Bempedoic Acid (NEXLETOL) 180 MG TABS Take 1 tablet by mouth daily. 90 tablet 3  ? cholecalciferol (VITAMIN D) 1000 units tablet Take 3,000 Units by mouth daily.    ? Evolocumab with Infusor (  [place]) 420 MG/3.5ML SOCT INJECT 1 APPLICATOR INTO THE SKIN EVERY 30 DAYS 3.5 mL 11  ? fluticasone (FLONASE) 50 MCG/ACT nasal spray Place 1 spray into both nostrils as needed for allergies or rhinitis.    ? hydrOXYzine (VISTARIL) 25 MG capsule Take 1 capsule (25 mg total) by mouth 2 (two) times daily as needed. 30 capsule 1  ? Lactobacillus (PROBIOTIC ACIDOPHILUS PO) Take 1 capsule by mouth daily.    ? Magnesium Citrate 100 MG TABS Take by  mouth daily as needed.     ? metoprolol succinate (TOPROL-XL) 25 MG 24 hr tablet TAKE 1 TABLET BY MOUTH ONCE DAILY WITH  OR  IMMEDIATELY  FOLLOWING  A  MEAL 90 tablet 3  ? vitamin B-12 (CYANOCOBALAMIN) 1000 MCG tablet Take 1 tablet by mouth daily.    ? zinc sulfate 220 (50 Zn) MG capsule Take by mouth daily.     ? Zoledronic Acid (RECLAST IV)     ? ?No current facility-administered medications for this visit.  ? ? ?Allergies:   Penicillins, Piroxicam, Statins, Sulfa antibiotics, and Zetia [ezetimibe]  ? ? ?Social History:  The patient  reports that she quit smoking about 32 years ago. Her smoking use included cigarettes. She smoked an average of 1 pack per day. She has never been exposed to tobacco smoke. She has never used smokeless tobacco. She reports current alcohol use. She reports that she does not use drugs.  ? ?Family History:  The patient's family history includes Arthritis in her father and mother; Colon polyps in her mother; Heart disease in her father; Hyperlipidemia in her brother and sister; Hypertension in her brother, father, mother, and sister; Kidney disease in her father; Stroke in her mother.  ? ? ?ROS:  Please see the history of present illness.    ?(+) Chest pain ?(+) Palpitations ?(+) Bilateral knee pain ?Otherwise, review of systems are positive for none.   All other systems are reviewed and negative.  ? ? ?PHYSICAL EXAM: ?VS:  BP 124/84 (BP Location: Right Arm, Patient Position: Sitting, Cuff Size: Normal)   Pulse 67   Ht '5\' 7"'$  (1.702 m)   Wt 155 lb 8 oz (70.5 kg)   BMI 24.35 kg/m?  , BMI Body mass index is 24.35 kg/m?. ?GENERAL:  Well appearing ?HEENT: Pupils equal round and reactive, fundi not visualized, oral mucosa unremarkable ?NECK:  No jugular venous distention, waveform within normal limits, carotid upstroke brisk and symmetric, no bruits ?LUNGS:  Clear to auscultation bilaterally ?HEART:  RRR.  PMI not displaced or sustained,S1 and S2 within normal limits, no S3, no S4, no  clicks, no rubs, no murmurs ?ABD:  Flat, positive bowel sounds normal in frequency in pitch, no bruits, no rebound, no guarding, no midline pulsatile mass, no hepatomegaly, no splenomegaly ?EXT:  2 plus pulses throughout, no edema, no cyanosis no clubbing ?SKIN:  No rashes no nodules ?NEURO:  Cranial nerves II through XII grossly intact, motor grossly intact throughout ?PSYCH:  Cognitively intact, oriented to person place and time ? ?EKG:   ?06/02/21: Sinus rhythm rate 67 ?07/17/2019: Sinus rhythm.  Rate 65 bpm. ?11/24/18: sinus rhythm.  Rate 81 bpm.  PVC. ? ?Recent Labs: ?10/02/2020: TSH 0.93 ?02/20/2021: ALT 8; BUN 14; Creatinine 0.86; Hemoglobin 13.1; Platelet Count 235; Potassium 3.7; Sodium 141  ? ? ?Lipid Panel ?   ?Component Value Date/Time  ? CHOL 205 (H) 09/30/2020 1212  ? TRIG 107 09/30/2020 1212  ? HDL 65 09/30/2020 1212  ?  CHOLHDL 3.2 09/30/2020 1212  ? LDLCALC 121 (H) 09/30/2020 1212  ? ?  ? ?Wt Readings from Last 3 Encounters:  ?06/02/21 155 lb 8 oz (70.5 kg)  ?05/29/21 152 lb 12.8 oz (69.3 kg)  ?04/09/21 150 lb (68 kg)  ?  ? ? ?ASSESSMENT AND PLAN: ?Coronary artery calcification of native artery ?Non-obstructive CAD.  She exercises regularly and has no angina.  Lipids remain uncontrolled on Repatha.  She did get a 50% reduction from her baseline LDL.  However given her calcifications, would like to get her LDL down at least less than 70, if not less than 55 given her familial hyperlipidemia.  Continue Repatha and we will add Nexletol 180 mg daily.  She did not tolerate Zetia due to muscle cramps.  She has not tolerated statins.  Repeat lipids and a CMP in 2 to 3 months.  She will keep working on her regular exercise and limiting fried foods, fatty food, red meat, and cheese. ? ?Inappropriate sinus tachycardia ?She has intermittent palpitations in the am upon awakening.  No arrhythmias on her monitor in 2020.  She has no exertional symptoms.  Continue metoprolol.  ? ?Familial hyperlipidemia ?Continue  Repatha and add Nexletol as above. ? ? ?Current medicines are reviewed at length with the patient today.  The patient does not have concerns regarding medicines. ? ?The following changes have been made: None ? ?Labs/ t

## 2021-06-02 ENCOUNTER — Encounter (HOSPITAL_BASED_OUTPATIENT_CLINIC_OR_DEPARTMENT_OTHER): Payer: Self-pay | Admitting: Cardiovascular Disease

## 2021-06-02 ENCOUNTER — Ambulatory Visit (INDEPENDENT_AMBULATORY_CARE_PROVIDER_SITE_OTHER): Payer: Medicare Other | Admitting: Cardiovascular Disease

## 2021-06-02 VITALS — BP 124/84 | HR 67 | Ht 67.0 in | Wt 155.5 lb

## 2021-06-02 DIAGNOSIS — Z5181 Encounter for therapeutic drug level monitoring: Secondary | ICD-10-CM

## 2021-06-02 DIAGNOSIS — E7849 Other hyperlipidemia: Secondary | ICD-10-CM

## 2021-06-02 DIAGNOSIS — E785 Hyperlipidemia, unspecified: Secondary | ICD-10-CM

## 2021-06-02 DIAGNOSIS — R000 Tachycardia, unspecified: Secondary | ICD-10-CM

## 2021-06-02 MED ORDER — NEXLETOL 180 MG PO TABS: 1.0000 | ORAL_TABLET | Freq: Every day | ORAL | 3 refills | Status: DC

## 2021-06-02 NOTE — Assessment & Plan Note (Signed)
She has intermittent palpitations in the am upon awakening.  No arrhythmias on her monitor in 2020.  She has no exertional symptoms.  Continue metoprolol.  ?

## 2021-06-02 NOTE — Assessment & Plan Note (Signed)
Non-obstructive CAD.  She exercises regularly and has no angina.  Lipids remain uncontrolled on Repatha.  She did get a 50% reduction from her baseline LDL.  However given her calcifications, would like to get her LDL down at least less than 70, if not less than 55 given her familial hyperlipidemia.  Continue Repatha and we will add Nexletol 180 mg daily.  She did not tolerate Zetia due to muscle cramps.  She has not tolerated statins.  Repeat lipids and a CMP in 2 to 3 months.  She will keep working on her regular exercise and limiting fried foods, fatty food, red meat, and cheese. ?

## 2021-06-02 NOTE — Assessment & Plan Note (Signed)
Continue Repatha and add Nexletol as above. ?

## 2021-06-02 NOTE — Patient Instructions (Addendum)
Medication Instructions:  ?START NEXLETOL 180 MG DAILY  ? ?*If you need a refill on your cardiac medications before your next appointment, please call your pharmacy* ? ?Lab Work: ?FASTING LP/CMET IN 2-3 MONTHS ? ?If you have labs (blood work) drawn today and your tests are completely normal, you will receive your results only by: ?MyChart Message (if you have MyChart) OR ?A paper copy in the mail ?If you have any lab test that is abnormal or we need to change your treatment, we will call you to review the results. ? ?Testing/Procedures: ?NONE ? ?Follow-Up: ?At [facility], you and your health needs are our priority.  As part of our continuing mission to provide you with exceptional heart care, we have created designated Provider Care Teams.  These Care Teams include your primary Cardiologist (physician) and Advanced Practice Providers (APPs -  Physician Assistants and Nurse Practitioners) who all work together to provide you with the care you need, when you need it. ? ?We recommend signing up for the patient portal called "MyChart".  Sign up information is provided on this After Visit Summary.  MyChart is used to connect with patients for Virtual Visits (Telemedicine).  Patients are able to view lab/test results, encounter notes, upcoming appointments, etc.  Non-urgent messages can be sent to your provider as well.   ?To learn more about what you can do with MyChart, go to [email or web address].   ? ?Your next appointment:   ?12 month(s) ? ?The format for your next appointment:   ?In Person ? ?Provider:   ?Skeet Latch, MD  ? ?  ? ? ? ?

## 2021-07-24 ENCOUNTER — Encounter: Payer: Self-pay | Admitting: Internal Medicine

## 2021-07-25 ENCOUNTER — Other Ambulatory Visit: Payer: Self-pay | Admitting: Internal Medicine

## 2021-07-25 DIAGNOSIS — F4322 Adjustment disorder with anxiety: Secondary | ICD-10-CM

## 2021-07-25 MED ORDER — HYDROXYZINE PAMOATE 25 MG PO CAPS: 25.0000 mg | ORAL_CAPSULE | Freq: Two times a day (BID) | ORAL | 1 refills | Status: DC | PRN

## 2021-08-15 ENCOUNTER — Telehealth: Payer: Self-pay

## 2021-08-15 NOTE — Telephone Encounter (Signed)
Attempted to contact the patient in regards to rescheduling with another provider, LVM for a call back. 

## 2021-10-02 ENCOUNTER — Ambulatory Visit: Payer: Medicare Other | Admitting: Endocrinology

## 2021-10-04 LAB — GLUCOSE, POCT (MANUAL RESULT ENTRY): POC Glucose: 60 mg/dl — AB (ref 70–99)

## 2021-10-06 ENCOUNTER — Ambulatory Visit: Payer: Self-pay

## 2021-10-06 NOTE — Progress Notes (Unsigned)
Office Visit Note  Patient: Vickie Pearson             Date of Birth: 1955-03-17           MRN: [id number]             PCP: Ladell Pier, MD Referring: Ladell Pier, MD Visit Date: 10/07/2021 Occupation: '@GUAROCC'$ @  Subjective:  Pain in both knees  History of Present Illness: Vickie Pearson is a 66 y.o. female with history of sero positive erosive rheumatoid arthritis, osteoarthritis, degenerative disc disease and osteoporosis.  She continues to have pain and discomfort in her bilateral knee joints.  She has severe end-stage osteoarthritis of her knee joints.  She had bilateral knee joint cortisone injections in April 2023 which were helpful.  The pain has recurred now.  She denies discomfort in any other joints.  She states she took Celebrex in the past which was helpful.  She does not want to take any DMARDs or Biologics as she is concerned about the side effects.  She does not recall getting Reclast infusion in 2022.  She has been experiencing some discomfort in her lower back.  Activities of Daily Living:  Patient reports morning stiffness for 10-15 minutes.   Patient Reports nocturnal pain.  Difficulty dressing/grooming: Denies Difficulty climbing stairs: Denies Difficulty getting out of chair: Denies Difficulty using hands for taps, buttons, cutlery, and/or writing: Denies  Review of Systems  Constitutional:  Positive for fatigue.  HENT:  Negative for mouth sores and mouth dryness.   Eyes:  Positive for dryness.  Respiratory:  Negative for shortness of breath.   Cardiovascular:  Negative for chest pain and palpitations.  Gastrointestinal:  Negative for blood in stool, constipation and diarrhea.  Endocrine: Positive for increased urination.  Genitourinary:  Negative for involuntary urination.  Musculoskeletal:  Positive for joint pain, joint pain, myalgias, morning stiffness, muscle tenderness and myalgias. Negative for gait problem, joint swelling  and muscle weakness.  Skin:  Negative for color change, rash, hair loss and sensitivity to sunlight.  Allergic/Immunologic: Negative for susceptible to infections.  Neurological:  Negative for dizziness and headaches.  Hematological:  Negative for swollen glands.  Psychiatric/Behavioral:  Positive for depressed mood and sleep disturbance. The patient is nervous/anxious.     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/24/2016   Hx of abnormal cervical Pap smear 07/24/2016   OA (osteoarthritis) of knee 07/24/2016    Past Medical History:  Diagnosis Date   Colon polyps    Coronary artery calcification of native artery 02/21/2019   Minimal calcification noted 11/2018 on coronary CTA   Costochondritis    DDD (degenerative disc disease), cervical    Degenerative arthritis of knee    Bilateral   Degenerative disc disease, cervical    Degenerative disc disease, cervical    Fibromyalgia    GERD (gastroesophageal reflux disease)    Hyperlipidemia    Inappropriate sinus tachycardia 02/21/2019   Monoclonal gammopathy  Multiple thyroid nodules    Osteoporosis    Rheumatoid arthritis ([place])     Family History  Problem Relation Age of Onset   Hypertension Mother    Colon polyps Mother    Stroke Mother    Arthritis Mother    Hypertension Father    Heart disease Father    Arthritis Father    Kidney disease Father    Hypertension  Sister    Hyperlipidemia Sister    Hypertension Brother    Hyperlipidemia Brother    Esophageal cancer Neg Hx    Rectal cancer Neg Hx    Stomach cancer Neg Hx    Osteoporosis Neg Hx    Past Surgical History:  Procedure Laterality Date   BREAST BIOPSY Right 2022   COLONOSCOPY  2012   COLONOSCOPY  2015   POLYPECTOMY     Social History   Social History Narrative   Not on file   Immunization History  Administered Date(s) Administered   Fluad Quad(high Dose 65+) 02/21/2020   Influenza, High Dose Seasonal PF 11/10/2018   Influenza, Quadrivalent, Recombinant, Inj, Pf 11/15/2017   Influenza,inj,Quad PF,6+ Mos 11/09/2016, 11/10/2018   PFIZER(Purple Top)SARS-COV-2 Vaccination 03/17/2019, 04/07/2019, 11/17/2019     Objective: Vital Signs: BP 128/79 (BP Location: Left Arm, Patient Position: Sitting, Cuff Size: Normal)   Pulse 69   Resp 14   Ht '5\' 7"'$  (1.702 m)   Wt 148 lb 6.4 oz (67.3 kg)   BMI 23.24 kg/m    Physical Exam Vitals and nursing note reviewed.  Constitutional:      Appearance: She is well-developed.  HENT:     Head: Normocephalic and atraumatic.  Eyes:     Conjunctiva/sclera: Conjunctivae normal.  Cardiovascular:     Rate and Rhythm: Normal rate and regular rhythm.     Heart sounds: Normal heart sounds.  Pulmonary:     Effort: Pulmonary effort is normal.     Breath sounds: Normal breath sounds.  Abdominal:     General: Bowel sounds are normal.     Palpations: Abdomen is soft.  Musculoskeletal:     Cervical back: Normal range of motion.  Lymphadenopathy:     Cervical: No cervical adenopathy.  Skin:    General: Skin is warm and dry.     Capillary Refill: Capillary refill takes less than 2 seconds.  Neurological:     Mental Status: She is alert and oriented to person, place, and time.  Psychiatric:        Behavior: Behavior normal.      Musculoskeletal Exam: C-spine was in good range of motion.  Shoulder joints, and elbow joints in good range of  motion.  She had limited extension of her wrist joints and tenosynovitis was noted over bilateral wrist joints.  MCP and PIP thickening with no synovitis was noted.  She had limited range of motion of her hip joints and limited extension of bilateral knee joints with warmth on palpation of bilateral knee joints.  MTP and PIP thickening was noted in her feet.  No synovitis was noted.  CDAI Exam: CDAI Score: 8.8  Patient Global: 4 mm; Provider Global: 4 mm Swollen: 4 ; Tender: 4  Joint Exam 10/07/2021      Right  Left  Wrist  Swollen Tender  Swollen Tender  Knee  Swollen Tender  Swollen Tender     Investigation: No additional findings.  Imaging: No results found.  Recent Labs: Lab Results  Component Value Date   WBC 5.5  02/20/2021   HGB 13.1 02/20/2021   PLT 235 02/20/2021   NA 141 02/20/2021   K 3.7 02/20/2021   CL 109 02/20/2021   CO2 26 02/20/2021   GLUCOSE 76 02/20/2021   BUN 14 02/20/2021   CREATININE 0.86 02/20/2021   BILITOT 0.4 02/20/2021   ALKPHOS 64 02/20/2021   AST 14 (L) 02/20/2021   ALT 8 02/20/2021   PROT 7.0 02/20/2021   ALBUMIN 3.9 02/20/2021   CALCIUM 9.3 02/20/2021   GFRAA 78 07/02/2020    Speciality Comments: No specialty comments available.  Procedures:  Large Joint Inj: bilateral knee on 10/07/2021 10:27 AM Indications: pain Details: 27 G 1.5 in needle, medial approach  Arthrogram: No  Medications (Right): 1.5 mL lidocaine 1 %; 40 mg triamcinolone acetonide 40 MG/ML Aspirate (Right): 0 mL Medications (Left): 1.5 mL lidocaine 1 %; 40 mg triamcinolone acetonide 40 MG/ML Aspirate (Left): 0 mL Outcome: tolerated well, no immediate complications Procedure, treatment alternatives, risks and benefits explained, specific risks discussed. Consent was given by the patient. Immediately prior to procedure a time out was called to verify the correct patient, procedure, equipment, support staff and site/side marked as required. Patient was prepped and  draped in the usual sterile fashion.     Allergies: Penicillins, Piroxicam, Statins, Sulfa antibiotics, and Zetia [ezetimibe]   Assessment / Plan:     Visit Diagnoses: Rheumatoid arthritis of multiple sites with negative rheumatoid factor (HCC)-patient continues to have some bilateral wrist joints extensor tenosynovitis.  She also had some warmth in her knee joints.  We had detailed discussion in the past regarding immunosuppressive agents but she does not want to take any DMARDs and Biologics.  She had erosive changes on her x-rays in the past.  She declined repeat x-rays.  Increased risk of joint  destruction and systemic effects of rheumatoid arthritis including ILD was discussed.  He voiced understanding.  Chronic iritis of both eyes-she had no recurrence of iritis in a long time.  Chronic pain of both knees-she can history of pain and discomfort in her knee joints.  She has severe osteoarthritis and limited extension.  She had good response to cortisone injection in April 2023.  She requested repeat cortisone injections.  After informed consent was obtained and side effects was discussed bilateral knee joints were injected with lidocaine and cortisone as described above.  Patient tolerated the procedure well.  Postprocedure instructions were given.  Primary osteoarthritis of both knees - Severe end-stage rheumatoid arthritis of both knees. s/p bilateral knee cortisone injections on 05/29/2021.  DDD (degenerative disc disease), cervical-she had good range of motion without discomfort.  DDD (degenerative disc disease), lumbar-she had some limitation range of motion with discomfort.  Age-related osteoporosis without current pathological fracture - DEXA updated on 07/07/19: The BMD measured at Femur Neck Left is 0.624 g/cm2 with a T-score of-3.0.Reclast 2017, 2022 by Dr. Loanne Drilling.  Vitamin D deficiency-her vitamin D was normal on October 02, 2020 at 43.47  Multiple thyroid nodules  Mixed  hyperlipidemia  Monoclonal gammopathy - Followed by Dr. Lorenso Courier.   Orders: No orders of the defined types were placed in this encounter.  No orders of the defined types were placed in this encounter.    Follow-Up Instructions: Return in about 6 months (around 04/09/2022) for Rheumatoid arthritis, Osteoarthritis.   Bo Merino, MD  Note - This record has been created using Editor, commissioning.  Chart creation errors have been sought, but may not always  have been located. Such creation errors do  not reflect on  the standard of medical care.

## 2021-10-06 NOTE — Telephone Encounter (Signed)
Advised of s/s of hypoglycemia foods to help reverse this. Patient states she now has instant glucose tablets.  Given sooner apt from what was scheduled previously.

## 2021-10-06 NOTE — Telephone Encounter (Signed)
Pt is calling to report that her glucose read 60 on Saturday after eating. Pt is concerned. Please advise      Chief Complaint: Blood glucose at health fair was 60 after eating breakfast. Had eaten eggs,grits,pastry. Has appointment 10/21/21, but requests to be seen sooner."I'm very concerned." Symptoms: No symptoms Frequency: Saturday Pertinent Negatives: Patient denies  Disposition: '[]'$ ED /'[]'$ Urgent Care (no appt availability in office) / '[]'$ Appointment(In office/virtual)/ '[]'$  [facility]/ '[]'$ Home Care/ '[]'$ Refused Recommended Disposition /'[]'$ [facility]/ '[x]'$  Follow-up with PCP Additional Notes: Please advise pt.  Answer Assessment - Initial Assessment Questions 1. SYMPTOMS: "What symptoms are you concerned about?"     No symptoms 2. ONSET:  "When did the symptoms start?"     Saturday 3. BLOOD GLUCOSE: "What is your blood glucose level?"      Done at a health fair 4. USUAL RANGE: "What is your blood glucose level usually?" (e.g., usual fasting morning value, usual evening value)     Does not check 5. TYPE 1 or 2:  "Do you know what type of diabetes you have?"  (e.g., Type 1, Type 2, Gestational; doesn't know)      None 6. INSULIN: "Do you take insulin?" "What type of insulin(s) do you use? What is the mode of delivery? (syringe, pen; injection or pump) "When did you last give yourself an insulin dose?" (i.e., time or hours/minutes ago) "How much did you give?" (i.e., how many units)     N/a 7. DIABETES PILLS: "Do you take any pills for your diabetes?" If Yes, ask: "What is the name of the medicine(s) that you take for high blood sugar?"     No 8. OTHER SYMPTOMS: "Do you have any symptoms?" (e.g., fever, frequent urination, difficulty breathing, vomiting)     None 9. LOW BLOOD GLUCOSE TREATMENT: "What have you done so far to treat the low blood glucose level?"     Ate 10. FOOD: "When did you last eat or drink?"       Today 11. ALONE: "Are you alone right now or is  someone with you?"        No 12. PREGNANCY: "Is there any chance you are pregnant?" "When was your last menstrual period?"       No  Protocols used: Diabetes - Low Blood Sugar-A-AH

## 2021-10-07 ENCOUNTER — Encounter: Payer: Self-pay | Admitting: Rheumatology

## 2021-10-07 ENCOUNTER — Ambulatory Visit: Payer: Medicare Other | Attending: Rheumatology | Admitting: Rheumatology

## 2021-10-07 VITALS — BP 128/79 | HR 69 | Resp 14 | Ht 67.0 in | Wt 148.4 lb

## 2021-10-07 DIAGNOSIS — M810 Age-related osteoporosis without current pathological fracture: Secondary | ICD-10-CM | POA: Insufficient documentation

## 2021-10-07 DIAGNOSIS — M0609 Rheumatoid arthritis without rheumatoid factor, multiple sites: Secondary | ICD-10-CM | POA: Insufficient documentation

## 2021-10-07 DIAGNOSIS — D472 Monoclonal gammopathy: Secondary | ICD-10-CM | POA: Insufficient documentation

## 2021-10-07 DIAGNOSIS — M5030 Other cervical disc degeneration, unspecified cervical region: Secondary | ICD-10-CM | POA: Insufficient documentation

## 2021-10-07 DIAGNOSIS — M5136 Other intervertebral disc degeneration, lumbar region: Secondary | ICD-10-CM | POA: Diagnosis present

## 2021-10-07 DIAGNOSIS — G8929 Other chronic pain: Secondary | ICD-10-CM | POA: Insufficient documentation

## 2021-10-07 DIAGNOSIS — E042 Nontoxic multinodular goiter: Secondary | ICD-10-CM | POA: Insufficient documentation

## 2021-10-07 DIAGNOSIS — M170 Bilateral primary osteoarthritis of knee: Secondary | ICD-10-CM | POA: Diagnosis present

## 2021-10-07 DIAGNOSIS — E559 Vitamin D deficiency, unspecified: Secondary | ICD-10-CM | POA: Insufficient documentation

## 2021-10-07 DIAGNOSIS — M25562 Pain in left knee: Secondary | ICD-10-CM | POA: Diagnosis present

## 2021-10-07 DIAGNOSIS — H2013 Chronic iridocyclitis, bilateral: Secondary | ICD-10-CM | POA: Insufficient documentation

## 2021-10-07 DIAGNOSIS — E782 Mixed hyperlipidemia: Secondary | ICD-10-CM | POA: Insufficient documentation

## 2021-10-07 DIAGNOSIS — M25561 Pain in right knee: Secondary | ICD-10-CM | POA: Insufficient documentation

## 2021-10-07 MED ORDER — LIDOCAINE HCL 1 % IJ SOLN
1.5000 mL | INTRAMUSCULAR | Status: AC | PRN
  Administered 2021-10-07: 1.5 mL

## 2021-10-07 MED ORDER — TRIAMCINOLONE ACETONIDE 40 MG/ML IJ SUSP
40.0000 mg | INTRAMUSCULAR | Status: AC | PRN
Start: 2021-10-07 — End: 2021-10-07
  Administered 2021-10-07: 40 mg via INTRA_ARTICULAR

## 2021-10-07 MED ORDER — TRIAMCINOLONE ACETONIDE 40 MG/ML IJ SUSP
40.0000 mg | INTRAMUSCULAR | Status: AC | PRN
  Administered 2021-10-07: 40 mg via INTRA_ARTICULAR

## 2021-10-10 ENCOUNTER — Ambulatory Visit: Payer: Medicare Other | Attending: Internal Medicine | Admitting: Internal Medicine

## 2021-10-10 ENCOUNTER — Encounter: Payer: Self-pay | Admitting: Internal Medicine

## 2021-10-10 VITALS — BP 140/70 | HR 65 | Ht 67.0 in | Wt 150.0 lb

## 2021-10-10 DIAGNOSIS — I2510 Atherosclerotic heart disease of native coronary artery without angina pectoris: Secondary | ICD-10-CM | POA: Diagnosis not present

## 2021-10-10 DIAGNOSIS — M069 Rheumatoid arthritis, unspecified: Secondary | ICD-10-CM | POA: Diagnosis not present

## 2021-10-10 DIAGNOSIS — E162 Hypoglycemia, unspecified: Secondary | ICD-10-CM | POA: Insufficient documentation

## 2021-10-10 DIAGNOSIS — R030 Elevated blood-pressure reading, without diagnosis of hypertension: Secondary | ICD-10-CM

## 2021-10-10 DIAGNOSIS — M797 Fibromyalgia: Secondary | ICD-10-CM | POA: Insufficient documentation

## 2021-10-10 DIAGNOSIS — E559 Vitamin D deficiency, unspecified: Secondary | ICD-10-CM | POA: Insufficient documentation

## 2021-10-10 DIAGNOSIS — D472 Monoclonal gammopathy: Secondary | ICD-10-CM | POA: Diagnosis not present

## 2021-10-10 DIAGNOSIS — M170 Bilateral primary osteoarthritis of knee: Secondary | ICD-10-CM | POA: Insufficient documentation

## 2021-10-10 DIAGNOSIS — F419 Anxiety disorder, unspecified: Secondary | ICD-10-CM | POA: Diagnosis not present

## 2021-10-10 LAB — POCT GLYCOSYLATED HEMOGLOBIN (HGB A1C): Hemoglobin A1C: 5.5 % (ref 4.0–5.6)

## 2021-10-10 LAB — GLUCOSE, POCT (MANUAL RESULT ENTRY): POC Glucose: 97 mg/dl (ref 70–99)

## 2021-10-10 MED ORDER — ACCU-CHEK GUIDE W/DEVICE KIT: PACK | 0 refills | Status: DC

## 2021-10-10 MED ORDER — HYDROXYZINE PAMOATE 25 MG PO CAPS: 25.0000 mg | ORAL_CAPSULE | Freq: Two times a day (BID) | ORAL | 1 refills | Status: DC | PRN

## 2021-10-10 MED ORDER — ACCU-CHEK SOFTCLIX LANCETS MISC: 12 refills | Status: DC

## 2021-10-10 MED ORDER — ACCU-CHEK GUIDE VI STRP: ORAL_STRIP | 12 refills | Status: DC

## 2021-10-10 NOTE — Progress Notes (Signed)
CBG-97 A1C-5.5

## 2021-10-10 NOTE — Patient Instructions (Signed)
Please check your blood sugars twice a day before meals for the next 2 weeks.  Send me your results via [facility].  Try to eat 3 meals a day.

## 2021-10-10 NOTE — Progress Notes (Signed)
Patient ID: ANASTAISA WOODING, female    DOB: 1956/01/03  MRN: [id number]  CC: Hyperglycemia   Subjective: Garlene Apperson is a 66 y.o. female who presents for concern about hyperglycemia Her concerns today include:  Hx of RA, OA knees, monoclonal gammopathy (Dr. Lorenso Courier), familial HL and non-obstrutive CAD, Vit D def, osteoporosis,thyroid nodules (followed by Dr. Loanne Drilling), cervical radiculopathy, fibromyalgia.   Seen at [facility] 10/04/2021 and told BS was low at 60.  Patient states it was in the afternoon but she did eat breakfast that morning. Not checking BS since then She has not been eating well; gets in 1-2 meals a day.  Some days she just does not feel like eating.  After health fair she started eating better getting in at least 2-3 meals a day -endorses feeling shaky/dizzy but not often Ate Oatmeal this a.m No new meds Results for orders placed or performed in visit on 10/10/21  POCT glucose (manual entry)  Result Value Ref Range   POC Glucose 97 70 - 99 mg/dl  POCT glycosylated hemoglobin (Hb A1C)  Result Value Ref Range   Hemoglobin A1C 5.5 4.0 - 5.6 %   HbA1c POC (<> result, manual entry)     HbA1c, POC (prediabetic range)     HbA1c, POC (controlled diabetic range)     Requests refill on hydroxyzine to use as needed for anxious mood.  Reports that she is doing well with it.  Blood pressure noted to be elevated today.  No previous issues with blood pressure.  I have looked at her review of vitals in the system.  Looks like her levels have been pretty good on encounters with other providers within our health system.  Patient Active Problem List   Diagnosis Date Noted   Immunosuppression ([place]) 04/21/2019   Coronary artery calcification of native artery 02/21/2019   Inappropriate sinus tachycardia 02/21/2019   Other chest pain 03/01/2017   Rheumatoid arthritis involving multiple sites ([place]) 07/24/2016   Cervical radiculopathy 07/24/2016   Familial  hyperlipidemia 07/24/2016   Osteoporosis 07/24/2016   Vitamin D deficiency 07/24/2016   Multiple thyroid nodules 07/24/2016   MGUS (monoclonal gammopathy of unknown significance) 07/24/2016   Colon polyps 07/24/2016   Chronic iritis of both eyes 07/24/2016   Hx of abnormal cervical Pap smear 07/24/2016   OA (osteoarthritis) of knee 07/24/2016     Current Outpatient Medications on File Prior to Visit  Medication Sig Dispense Refill   cholecalciferol (VITAMIN D) 1000 units tablet Take 3,000 Units by mouth daily.     Evolocumab with Infusor ([place]) 420 MG/3.5ML SOCT INJECT 1 APPLICATOR INTO THE SKIN EVERY 30 DAYS 3.5 mL 11   fluticasone (FLONASE) 50 MCG/ACT nasal spray Place 1 spray into both nostrils as needed for allergies or rhinitis.     Lactobacillus (PROBIOTIC ACIDOPHILUS PO) Take 1 capsule by mouth daily.     Magnesium Citrate 100 MG TABS Take by mouth daily as needed.      metoprolol succinate (TOPROL-XL) 25 MG 24 hr tablet TAKE 1 TABLET BY MOUTH ONCE DAILY WITH  OR  IMMEDIATELY  FOLLOWING  A  MEAL 90 tablet 3   vitamin B-12 (CYANOCOBALAMIN) 1000 MCG tablet Take 1 tablet by mouth daily.     zinc sulfate 220 (50 Zn) MG capsule Take by mouth daily.      Zoledronic Acid (RECLAST IV)      Bempedoic Acid (NEXLETOL) 180 MG TABS Take 1 tablet by mouth daily. 90 tablet  3   No current facility-administered medications on file prior to visit.    Allergies  Allergen Reactions   Penicillins Hives    Has patient had a PCN reaction causing immediate rash, facial/tongue/throat swelling, SOB or lightheadedness with hypotension: No Has patient had a PCN reaction causing severe rash involving mucus membranes or skin necrosis: Yes Has patient had a PCN reaction that required hospitalization: No Has patient had a PCN reaction occurring within the last 10 years: No If all of the above answers are "NO", then may proceed with Cephalosporin use.    Piroxicam Hives   Statins     Sulfa Antibiotics Hives   Zetia [Ezetimibe]     Muscle cramps    Social History   Socioeconomic History   Marital status: Widowed    Spouse name: Not on file   Number of children: Not on file   Years of education: Not on file   Highest education level: Not on file  Occupational History   Not on file  Tobacco Use   Smoking status: Former    Packs/day: 1.00    Types: Cigarettes    Quit date: 07/24/1988    Years since quitting: 33.2    Passive exposure: Never   Smokeless tobacco: Never  Vaping Use   Vaping Use: Never used  Substance and Sexual Activity   Alcohol use: Yes    Comment: occasionally   Drug use: No   Sexual activity: Not on file  Other Topics Concern   Not on file  Social History Narrative   Not on file   Social Determinants of Health   Financial Resource Strain: Not on file  Food Insecurity: Not on file  Transportation Needs: Not on file  Physical Activity: Not on file  Stress: Not on file  Social Connections: Not on file  Intimate Partner Violence: Not on file    Family History  Problem Relation Age of Onset   Hypertension Mother    Colon polyps Mother    Stroke Mother    Arthritis Mother    Hypertension Father    Heart disease Father    Arthritis Father    Kidney disease Father    Hypertension Sister    Hyperlipidemia Sister    Hypertension Brother    Hyperlipidemia Brother    Esophageal cancer Neg Hx    Rectal cancer Neg Hx    Stomach cancer Neg Hx    Osteoporosis Neg Hx     Past Surgical History:  Procedure Laterality Date   BREAST BIOPSY Right 2022   COLONOSCOPY  2012   COLONOSCOPY  2015   POLYPECTOMY      ROS: Review of Systems Negative except as stated above  PHYSICAL EXAM: BP (!) 140/70   Pulse 65   Ht '5\' 7"'  (1.702 m)   Wt 150 lb (68 kg)   SpO2 100%   BMI 23.49 kg/m   Wt Readings from Last 3 Encounters:  10/10/21 150 lb (68 kg)  10/07/21 148 lb 6.4 oz (67.3 kg)  06/02/21 155 lb 8 oz (70.5 kg)    Physical  Exam  General appearance - alert, well appearing, and in no distress Mental status - normal mood, behavior, speech, dress, motor activity, and thought processes Chest - clear to auscultation, no wheezes, rales or rhonchi, symmetric air entry Heart - normal rate, regular rhythm, normal S1, S2, no murmurs, rubs, clicks or gallops Extremities - peripheral pulses normal, no pedal edema, no clubbing or cyanosis  Results  for orders placed or performed in visit on 10/10/21  POCT glucose (manual entry)  Result Value Ref Range   POC Glucose 97 70 - 99 mg/dl  POCT glycosylated hemoglobin (Hb A1C)  Result Value Ref Range   Hemoglobin A1C 5.5 4.0 - 5.6 %   HbA1c POC (<> result, manual entry)     HbA1c, POC (prediabetic range)     HbA1c, POC (controlled diabetic range)         Latest Ref Rng & Units 02/20/2021   10:18 AM 10/02/2020   10:53 AM 09/30/2020   12:12 PM  CMP  Glucose 70 - 99 mg/dL 76  90  85   BUN 8 - 23 mg/dL '14  10  12   ' Creatinine 0.44 - 1.00 mg/dL 0.86  0.89  0.84   Sodium 135 - 145 mmol/L 141  139  141   Potassium 3.5 - 5.1 mmol/L 3.7  3.9  5.1   Chloride 98 - 111 mmol/L 109  105  102   CO2 22 - 32 mmol/L '26  26  23   ' Calcium 8.9 - 10.3 mg/dL 9.3  9.7    9.7  10.1   Total Protein 6.5 - 8.1 g/dL 7.0   6.9   Total Bilirubin 0.3 - 1.2 mg/dL 0.4   0.2   Alkaline Phos 38 - 126 U/L 64   85   AST 15 - 41 U/L 14   15   ALT 0 - 44 U/L 8   8    Lipid Panel     Component Value Date/Time   CHOL 205 (H) 09/30/2020 1212   TRIG 107 09/30/2020 1212   HDL 65 09/30/2020 1212   CHOLHDL 3.2 09/30/2020 1212   LDLCALC 121 (H) 09/30/2020 1212    CBC    Component Value Date/Time   WBC 5.5 02/20/2021 1018   WBC 5.8 07/02/2020 1038   RBC 4.19 02/20/2021 1018   HGB 13.1 02/20/2021 1018   HGB 12.7 02/11/2017 1035   HCT 39.3 02/20/2021 1018   HCT 39.6 02/11/2017 1035   PLT 235 02/20/2021 1018   PLT 239 02/11/2017 1035   PLT 256 07/24/2016 1654   MCV 93.8 02/20/2021 1018   MCV 99.7  02/11/2017 1035   MCH 31.3 02/20/2021 1018   MCHC 33.3 02/20/2021 1018   RDW 15.1 02/20/2021 1018   RDW 14.9 (H) 02/11/2017 1035   LYMPHSABS 2.6 02/20/2021 1018   LYMPHSABS 2.0 02/11/2017 1035   MONOABS 0.4 02/20/2021 1018   MONOABS 0.3 02/11/2017 1035   EOSABS 0.2 02/20/2021 1018   EOSABS 0.1 02/11/2017 1035   BASOSABS 0.0 02/20/2021 1018   BASOSABS 0.0 02/11/2017 1035    ASSESSMENT AND PLAN: 1. Hypoglycemia -Looking at patient's medication list, I do not see any medicines that may cause hypoglycemia.  She is not taking any oral hypoglycemic/diabetic meds.  In looking at previous chemistries in her chart, looks like blood glucose usually range between 80s to 90s with the lowest being 76 in January of this year. Advised that she tries to eat her 3 meals a day. Went over signs and symptoms of hypoglycemia. We will send prescription to her pharmacy for glucometer and testing supplies.  Advised checking blood sugars at least twice a day before meals for the next 2 weeks and send me the results via MyChart.  If she is having low blood sugar episodes, then we would need to do further work-up. - POCT glucose (manual entry) - POCT glycosylated  hemoglobin (Hb A1C) - glucose blood (ACCU-CHEK GUIDE) test strip; Use as instructed  Dispense: 100 each; Refill: 12 - Accu-Chek Softclix Lancets lancets; Use as instructed  Dispense: 100 each; Refill: 12 - Blood Glucose Monitoring Suppl (ACCU-CHEK GUIDE) w/Device KIT; Check blood sugars twice a day before meals.  Dispense: 1 kit; Refill: 0  2. Anxiety - hydrOXYzine (VISTARIL) 25 MG capsule; Take 1 capsule (25 mg total) by mouth 2 (two) times daily as needed.  Dispense: 30 capsule; Refill: 1  3. Elevated blood-pressure reading without diagnosis of hypertension Previous blood pressure readings as noted in the chart have been good.  DASH diet discussed and encouraged.  We will plan to recheck blood pressure on subsequent visit.     Patient was given  the opportunity to ask questions.  Patient verbalized understanding of the plan and was able to repeat key elements of the plan.   This documentation was completed using Radio producer.  Any transcriptional errors are unintentional.  Orders Placed This Encounter  Procedures   POCT glucose (manual entry)   POCT glycosylated hemoglobin (Hb A1C)     Requested Prescriptions   Signed Prescriptions Disp Refills   glucose blood (ACCU-CHEK GUIDE) test strip 100 each 12    Sig: Use as instructed   Accu-Chek Softclix Lancets lancets 100 each 12    Sig: Use as instructed   Blood Glucose Monitoring Suppl (ACCU-CHEK GUIDE) w/Device KIT 1 kit 0    Sig: Check blood sugars twice a day before meals.   hydrOXYzine (VISTARIL) 25 MG capsule 30 capsule 1    Sig: Take 1 capsule (25 mg total) by mouth 2 (two) times daily as needed.    No follow-ups on file.  Karle Plumber, MD, FACP

## 2021-10-21 ENCOUNTER — Other Ambulatory Visit: Payer: Self-pay | Admitting: Cardiovascular Disease

## 2021-10-21 ENCOUNTER — Ambulatory Visit: Payer: BLUE CROSS/BLUE SHIELD | Admitting: Nurse Practitioner

## 2021-10-22 ENCOUNTER — Telehealth: Payer: Self-pay | Admitting: Internal Medicine

## 2021-10-22 DIAGNOSIS — E162 Hypoglycemia, unspecified: Secondary | ICD-10-CM

## 2021-10-22 MED ORDER — ACCU-CHEK GUIDE W/DEVICE KIT: PACK | 0 refills | Status: DC

## 2021-10-22 MED ORDER — ACCU-CHEK SOFTCLIX LANCETS MISC: 3 refills | Status: DC

## 2021-10-22 MED ORDER — ACCU-CHEK GUIDE VI STRP: ORAL_STRIP | 3 refills | Status: DC

## 2021-10-22 NOTE — Telephone Encounter (Signed)
Rx sent with additional instructions.

## 2021-10-22 NOTE — Telephone Encounter (Signed)
[facility] calling to advise script for the lancets and test strips need to have more info than "use as instructed".   Needs to have frequency   [place], [place]

## 2021-11-17 ENCOUNTER — Other Ambulatory Visit: Payer: Self-pay | Admitting: Internal Medicine

## 2021-11-17 DIAGNOSIS — Z1231 Encounter for screening mammogram for malignant neoplasm of breast: Secondary | ICD-10-CM

## 2021-11-18 ENCOUNTER — Encounter: Payer: Self-pay | Admitting: Internal Medicine

## 2021-11-18 ENCOUNTER — Ambulatory Visit (INDEPENDENT_AMBULATORY_CARE_PROVIDER_SITE_OTHER): Payer: Medicare Other | Admitting: Internal Medicine

## 2021-11-18 VITALS — BP 118/84 | HR 68 | Ht 67.0 in | Wt 152.4 lb

## 2021-11-18 DIAGNOSIS — E042 Nontoxic multinodular goiter: Secondary | ICD-10-CM

## 2021-11-18 DIAGNOSIS — M810 Age-related osteoporosis without current pathological fracture: Secondary | ICD-10-CM

## 2021-11-18 LAB — BASIC METABOLIC PANEL
BUN: 13 mg/dL (ref 6–23)
CO2: 29 mEq/L (ref 19–32)
Calcium: 9.9 mg/dL (ref 8.4–10.5)
Chloride: 105 mEq/L (ref 96–112)
Creatinine, Ser: 0.82 mg/dL (ref 0.40–1.20)
GFR: 73.83 mL/min (ref >60.00)
Glucose, Bld: 99 mg/dL (ref 70–99)
Potassium: 3.7 mEq/L (ref 3.5–5.1)
Sodium: 140 mEq/L (ref 135–145)

## 2021-11-18 NOTE — Patient Instructions (Addendum)
Please stop at the lab.  Continue Vitamin D 3000 units.  Continue Reclast for now.  Schedule a new DXA scan at the [facility].   Please return in 1 year.  How Can I Prevent Falls? Men and women with osteoporosis need to take care not to fall down. Falls can break bones. Some reasons people fall are: Poor vision  Poor balance  Certain diseases that affect how you walk  Some types of medicine, such as sleeping pills.  Some tips to help prevent falls outdoors are: Use a cane or walker  Wear rubber-soled shoes so you don't slip  Walk on grass when sidewalks are slippery  In winter, put salt or kitty litter on icy sidewalks.  Some ways to help prevent falls indoors are: Keep rooms free of clutter, especially on floors  Use plastic or carpet runners on slippery floors  Wear low-heeled shoes that provide good support  Do not walk in socks, stockings, or slippers  Be sure carpets and area rugs have skid-proof backs or are tacked to the floor  Be sure stairs are well lit and have rails on both sides  Put grab bars on bathroom walls near tub, shower, and toilet  Use a rubber bath mat in the shower or tub  Keep a flashlight next to your bed  Use a sturdy step stool with a handrail and wide steps  Add more lights in rooms (and night lights) Buy a cordless phone to keep with you so that you don't have to rush to the phone       when it rings and so that you can call for help if you fall.   (adapted from http://www.niams.NightlifePreviews.se)  Exercise for Strong Bones (from [place]) There are two types of exercises that are important for building and maintaining bone density:  weight-bearing and muscle-strengthening exercises. Weight-bearing Exercises These exercises include activities that make you move against gravity while staying upright. Weight-bearing exercises can be high-impact or low-impact. High-impact  weight-bearing exercises help build bones and keep them strong. If you have broken a bone due to osteoporosis or are at risk of breaking a bone, you may need to avoid high-impact exercises. If you're not sure, you should check with your healthcare provider. Examples of high-impact weight-bearing exercises are: Dancing Doing high-impact aerobics Hiking Jogging/running Jumping Rope Stair climbing Tennis Low-impact weight-bearing exercises can also help keep bones strong and are a safe alternative if you cannot do high-impact exercises. Examples of low-impact weight-bearing exercises are: Using elliptical training machines Doing low-impact aerobics Using stair-step machines Fast walking on a treadmill or outside Muscle-Strengthening Exercises These exercises include activities where you move your body, a weight or some other resistance against gravity. They are also known as resistance exercises and include: Lifting weights Using elastic exercise bands Using weight machines Lifting your own body weight Functional movements, such as standing and rising up on your toes Yoga and Pilates can also improve strength, balance and flexibility. However, certain positions may not be safe for people with osteoporosis or those at increased risk of broken bones. For example, exercises that have you bend forward may increase the chance of breaking a bone in the spine. A physical therapist should be able to help you learn which exercises are safe and appropriate for you. Non-Impact Exercises Non-impact exercises can help you to improve balance, posture and how well you move in everyday activities. These exercises can also help to increase muscle strength and decrease the risk of  falls and broken bones. Some of these exercises include: Balance exercises that strengthen your legs and test your balance, such as Tai Chi, can decrease your risk of falls. Posture exercises that improve your posture and reduce  rounded or "sloping" shoulders can help you decrease the chance of breaking a bone, especially in the spine. Functional exercises that improve how well you move can help you with everyday activities and decrease your chance of falling and breaking a bone. For example, if you have trouble getting up from a chair or climbing stairs, you should do these activities as exercises. A physical therapist can teach you balance, posture and functional exercises. Starting a New Exercise Program If you haven't exercised regularly for a while, check with your healthcare provider before beginning a new exercise program--particularly if you have health problems such as heart disease, diabetes or high blood pressure. If you're at high risk of breaking a bone, you should work with a physical therapist to develop a safe exercise program. Once you have your healthcare provider's approval, start slowly. If you've already broken bones in the spine because of osteoporosis, be very careful to avoid activities that require reaching down, bending forward, rapid twisting motions, heavy lifting and those that increase your chance of a fall. As you get started, your muscles may feel sore for a day or two after you exercise. If soreness lasts longer, you may be working too hard and need to ease up. Exercises should be done in a pain-free range of motion. How Much Exercise Do You Need? Weight-bearing exercises 30 minutes on most days of the week. Do a 30-minutesession or multiple sessions spread out throughout the day. The benefits to your bones are the same.   Muscle-strengthening exercises Two to three days per week. If you don't have much time for strengthening/resistance training, do small amounts at a time. You can do just one body part each day. For example do arms one day, legs the next and trunk the next. You can also spread these exercises out during your normal day.  Balance, posture and functional exercises Every day or as  often as needed. You may want to focus on one area more than the others. If you have fallen or lose your balance, spend time doing balance exercises. If you are getting rounded shoulders, work more on posture exercises. If you have trouble climbing stairs or getting up from the couch, do more functional exercises. You can also perform these exercises at one time or spread them during your day. Work with a phyiscal therapist to learn the right exercises for you.

## 2021-11-18 NOTE — Progress Notes (Unsigned)
Patient ID: Vickie Pearson, female   DOB: Jun 11, 1955, 66 y.o.   MRN: [id number]  HPI  Vickie Pearson is a 66 y.o.-year-old female, returning for follow-up for osteoporosis (OP) and multinodular goiter.  She previously saw Dr. Loanne Drilling, last visit 66 year and 2 months ago.  OP: - dx'ed  in 2018  I reviewed pt's DXA scans: Date L1-L4 T score FN T score 33% distal Radius Ultra distal radius  [facility] N/a RFN: -2.8 LFN: -3.0 -1.9 -0.8  04/29/2012 ([facility]) -0.6 RFN: N/a LFN: -2.3 N/a N/a   She denies fractures.  No falls.  + dizziness. No vertigo/orthostasis/poor vision.   Previous OP treatments:  - Reclast: 2018 (1 dose), 11/13/2020  She has a history of vitamin D insufficiency. Reviewed available vit D levels: Lab Results  Component Value Date   VD25OH 43.47 10/02/2020   VD25OH 51.84 09/27/2019   VD25OH 45 01/25/2017   VD25OH 27 (L) 10/02/2016   VD25OH 35.7 07/24/2016   Pt is on: - vitamin D 3000 units daily.  No weight bearing exercises. She does chair yoga, line dancing.  She does not take high vitamin A doses.  She has a history RA and was on prednisone between 2015-2018.   She also had steroid injections in the knees - continues.  She has a history of MGUS.  She smoked between [place].  Menopause was at 30s y/o.   No FH of osteoporosis.  No h/o hyper/hypocalcemia or hyperparathyroidism. No h/o kidney stones. Lab Results  Component Value Date   PTH 64 10/02/2020   PTH 35 09/27/2019   CALCIUM 9.3 02/20/2021   CALCIUM 9.7 10/02/2020   CALCIUM 9.7 10/02/2020   CALCIUM 10.1 09/30/2020   CALCIUM 9.9 07/02/2020   CALCIUM 9.7 02/21/2020   CALCIUM 10.3 09/27/2019   CALCIUM 10.5 09/27/2019   CALCIUM 9.6 02/15/2019   CALCIUM 9.8 02/06/2019   No h/o thyrotoxicosis. Reviewed TSH recent levels:  Lab Results  Component Value Date   TSH 0.93 10/02/2020   TSH 1.51 09/27/2019   TSH 0.935 11/24/2018   No h/o  CKD. Last BUN/Cr: Lab Results  Component Value Date   BUN 14 02/20/2021   CREATININE 0.86 02/20/2021   MNG: - euthyroid - dx'ed 2019  Thyroid ultrasound (10/09/2020): Thyroid nodules were low risk and no follow-up was needed: Parenchymal Echotexture: Mildly heterogenous Isthmus: 0.3 cm Right lobe: 4.6 x 1.2 x 2.1 cm Left lobe: 5.1 x 1.6 x 1.9 cm  Estimated total number of nodules >/= 1 cm: 3   Multiple small spongiform and mixed cystic/solid nodules with isoechoic solid component and peripheral colloid artifact in the left mid and lower gland.   None of these nodules meet criteria to warrant biopsy or dedicated imaging follow-up.   No new nodules or suspicious features identified.   IMPRESSION: Sonographically benign/low risk spongiform nodules and mixed cystic and solid nodules in the left mid and lower gland. None of the lesions meets criteria to warrant biopsy or further imaging follow-up.  Pt denies: - feeling nodules in neck - hoarseness - dysphagia - choking  ROS: + see HPI  I reviewed pt's medications, allergies, PMH, social hx, family hx, and changes were documented in the history of present illness. Otherwise, unchanged from my initial visit note.  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   Monoclonal gammopathy    Multiple thyroid nodules    Osteoporosis    Rheumatoid arthritis [facility])    Past Surgical History:  Procedure Laterality Date   BREAST BIOPSY Right 2022   COLONOSCOPY  2012   COLONOSCOPY  2015   POLYPECTOMY      Social History   Socioeconomic History   Marital status: Widowed    Spouse name: Not on file   Number of children: Not on file   Years of education: Not on file   Highest education level: Not on file  Occupational History   Not on file  Tobacco Use   Smoking status: Former    Packs/day: 1.00    Types: Cigarettes    Quit date: 07/24/1988    Years since quitting: 33.3    Passive exposure: Never   Smokeless tobacco: Never  Vaping Use   Vaping Use: Never used  Substance and Sexual Activity   Alcohol use: Yes    Comment: occasionally   Drug use: No   Sexual activity: Not on file  Other Topics Concern   Not on file  Social History Narrative   Not on file   Social Determinants of Health   Financial Resource Strain: Not on file  Food Insecurity: Not on file  Transportation Needs: Not on file  Physical Activity: Not on file  Stress: Not on file  Social Connections: Not on file  Intimate Partner Violence: Not on file   Current Outpatient Medications on File Prior to Visit  Medication Sig Dispense Refill   Accu-Chek Softclix Lancets lancets Check blood sugars twice a day before meals. 100 each 3   Bempedoic Acid (NEXLETOL) 180 MG TABS Take 1 tablet by mouth daily. 90 tablet 3   Blood Glucose Monitoring Suppl (ACCU-CHEK GUIDE) w/Device KIT Check blood sugars twice a day before meals. 1 kit 0   cholecalciferol (VITAMIN D) 1000 units tablet Take 3,000 Units by mouth daily.     Evolocumab with Infusor ([place]) 420 MG/3.5ML SOCT INJECT 1 once cartridge ONCE EVERY MONTH 3.4 mL 11   fluticasone (FLONASE) 50 MCG/ACT nasal spray Place 1 spray into both nostrils as needed for allergies or rhinitis.     glucose blood (ACCU-CHEK GUIDE) test strip Check blood sugars twice a day before meals. 100 each 3   hydrOXYzine (VISTARIL) 25 MG capsule Take 1 capsule (25 mg total) by mouth 2 (two) times daily as needed. 30 capsule 1   Lactobacillus (PROBIOTIC ACIDOPHILUS PO) Take  1 capsule by mouth daily.     Magnesium Citrate 100 MG TABS Take by mouth daily as needed.      metoprolol succinate (TOPROL-XL) 25 MG 24 hr tablet TAKE 1 TABLET BY MOUTH ONCE DAILY WITH  OR  IMMEDIATELY  FOLLOWING  A  MEAL 90 tablet 3   vitamin B-12 (CYANOCOBALAMIN) 1000 MCG tablet Take 1 tablet by mouth daily.     zinc sulfate 220 (50 Zn) MG capsule Take by mouth daily.      Zoledronic Acid (RECLAST IV)      No current facility-administered medications on file prior to visit.   Allergies  Allergen Reactions   Penicillins Hives  Has patient had a PCN reaction causing immediate rash, facial/tongue/throat swelling, SOB or lightheadedness with hypotension: No Has patient had a PCN reaction causing severe rash involving mucus membranes or skin necrosis: Yes Has patient had a PCN reaction that required hospitalization: No Has patient had a PCN reaction occurring within the last 10 years: No If all of the above answers are "NO", then may proceed with Cephalosporin use.    Piroxicam Hives   Statins    Sulfa Antibiotics Hives   Zetia [Ezetimibe]     Muscle cramps   Family History  Problem Relation Age of Onset   Hypertension Mother    Colon polyps Mother    Stroke Mother    Arthritis Mother    Hypertension Father    Heart disease Father    Arthritis Father    Kidney disease Father    Hypertension Sister    Hyperlipidemia Sister    Hypertension Brother    Hyperlipidemia Brother    Esophageal cancer Neg Hx    Rectal cancer Neg Hx    Stomach cancer Neg Hx    Osteoporosis Neg Hx     PE: BP 118/84 (BP Location: Left Arm, Patient Position: Sitting, Cuff Size: Normal)   Pulse 68   Ht 5' 7" (1.702 m)   Wt 152 lb 6.4 oz (69.1 kg)   BMI 23.87 kg/m  Wt Readings from Last 3 Encounters:  11/18/21 152 lb 6.4 oz (69.1 kg)  10/10/21 150 lb (68 kg)  10/07/21 148 lb 6.4 oz (67.3 kg)   Constitutional: Normal weight, in NAD. No kyphosis. Eyes: EOMI, no exophthalmos ENT: no  thyromegaly, no cervical lymphadenopathy Cardiovascular: RRR, No MRG Respiratory: CTA B Gastrointestinal: abdomen soft, NT, ND, BS+ Musculoskeletal: no deformities Skin: moist, warm, no rashes Neurological: no tremor with outstretched hands  Assessment: 1. Osteoporosis  2.  Vitamin D insufficiency  Plan: 1. Osteoporosis - likely postmenopausal/age-related, but possibly also contributed to by her history of RA, steroid treatment, and MGUS. - Discussed about increased risk of fracture, depending on the T score, greatly increased when the T score is lower than -2.5, but it is actually a continuum and -2.5 should not be regarded as an absolute threshold. We reviewed her latest DXA scan report together, and I explained that based on the T scores, she has an increased risk for fractures.  - we reviewed her dietary and supplemental calcium and vitamin D intake. I recommended to make sure she gets 1200 mg of calcium daily preferentially from diet and I will check vit D today to see if she needs further supplementation. - discussed fall precautions   - given handout from [place] Re: weight bearing exercises - advised to do this every day or at least 5/7 days - She has a history of smoking but is not smoking more than 30 years. - We discussed about the different medication classes, benefits and side effects (including atypical fractures and ONJ - no dental workup in progress or planned).  - I explained that first options are sq denosumab (Prolia) sq every 6 months for 6-10 years and zoledronic acid (Reclast) iv 1x a year for 1-2 years. I would use Teriparatide/Abaloparatide (which are daily subcu medication) or Romosozumab (monthly) as a last resort.  She was already started on Reclast, which she tolerates well.  She had 1 injection in 2018 and restarted last year.  We discussed about continuing this for now. -Discussed about the Reclast mechanism of action and degree of  fracture reduction - Will check the following labs today: Orders Placed This Encounter  Procedures   DG BONE DENSITY (DXA)   Basic metabolic panel   VITAMIN D 25 Hydroxy (Vit-D Deficiency, Fractures)  - will check a new DXA scan now -stable or slightly higher T-scores are desirable. - will see pt back in a year  2.  Vitamin D insufficiency -She continues on 3000 units vitamin D daily -Last level was normal last year -We will recheck a vitamin D level today  3. MNG -Euthyroid -latest TSH was normal in 2022. -No neck compression symptoms -Latest TSH obtained in 09/2020 was reassuring, none of the nodules needed intervention or follow-up -I explained that further ultrasounds are not needed unless she develops neck compression symptoms.  - Total time spent for the visit: 40 min, in precharting, reviewing Dr. Cordelia Pen last note, obtaining medical information from the chart and from the pt, reviewing her  previous labs, evaluations, and treatments, reviewing her symptoms, counseling her about her endocrine conditions (please see the discussed topics above), and developing a plan to further investigate and treat them.  Philemon Kingdom, MD PhD [facility] Endocrinology

## 2021-11-19 ENCOUNTER — Encounter: Payer: Self-pay | Admitting: Internal Medicine

## 2021-11-19 LAB — VITAMIN D 25 HYDROXY (VIT D DEFICIENCY, FRACTURES): VITD: 51.35 ng/mL (ref 30.00–100.00)

## 2021-12-08 ENCOUNTER — Encounter: Payer: Self-pay | Admitting: Internal Medicine

## 2021-12-11 ENCOUNTER — Other Ambulatory Visit: Payer: Self-pay | Admitting: Internal Medicine

## 2021-12-11 DIAGNOSIS — M810 Age-related osteoporosis without current pathological fracture: Secondary | ICD-10-CM

## 2021-12-11 MED ORDER — ZOLEDRONIC ACID 5 MG/100ML IV SOLN: 5.0000 mg | Freq: Once | INTRAVENOUS | Status: DC

## 2021-12-11 MED ORDER — ACETAMINOPHEN 325 MG PO TABS: 650.0000 mg | ORAL_TABLET | Freq: Once | ORAL | Status: DC

## 2021-12-11 MED ORDER — DIPHENHYDRAMINE HCL 25 MG PO CAPS: 25.0000 mg | ORAL_CAPSULE | Freq: Once | ORAL | Status: DC

## 2021-12-11 MED ORDER — SODIUM CHLORIDE 0.9 % IV SOLN: INTRAVENOUS | Status: DC

## 2021-12-12 ENCOUNTER — Telehealth: Payer: Self-pay | Admitting: Pharmacy Technician

## 2021-12-12 NOTE — Telephone Encounter (Signed)
Auth Submission: NO AUTH NEEDED Payer: MEDICARE A/B Medication & CPT/J Code(s) submitted: Reclast (Zolendronic acid) J3489 Route of submission (phone, fax, portal):  Phone # Fax # Auth type: Buy/Bill Units/visits requested: X1 DOSE Reference number:  Approval from:  to  at [place]

## 2021-12-17 ENCOUNTER — Ambulatory Visit
Admission: RE | Admit: 2021-12-17 | Discharge: 2021-12-17 | Disposition: A | Discharge status: Home / Self Care | Payer: Medicare Other | Source: Ambulatory Visit | Attending: Internal Medicine | Admitting: Internal Medicine

## 2021-12-17 DIAGNOSIS — Z1231 Encounter for screening mammogram for malignant neoplasm of breast: Secondary | ICD-10-CM

## 2021-12-22 ENCOUNTER — Ambulatory Visit (INDEPENDENT_AMBULATORY_CARE_PROVIDER_SITE_OTHER): Payer: Medicare Other

## 2021-12-22 VITALS — BP 125/79 | HR 60 | Temp 97.3°F | Resp 16 | Ht 67.0 in | Wt 155.0 lb

## 2021-12-22 DIAGNOSIS — M810 Age-related osteoporosis without current pathological fracture: Secondary | ICD-10-CM

## 2021-12-22 MED ORDER — SODIUM CHLORIDE 0.9 % IV SOLN: INTRAVENOUS | Status: DC

## 2021-12-22 MED ORDER — DIPHENHYDRAMINE HCL 25 MG PO CAPS
25.0000 mg | ORAL_CAPSULE | Freq: Once | ORAL | Status: AC
  Administered 2021-12-22: 25 mg via ORAL
  Filled 2021-12-22: qty 1

## 2021-12-22 MED ORDER — ZOLEDRONIC ACID 5 MG/100ML IV SOLN
5.0000 mg | Freq: Once | INTRAVENOUS | Status: AC
  Administered 2021-12-22: 5 mg via INTRAVENOUS
  Filled 2021-12-22: qty 100

## 2021-12-22 MED ORDER — ACETAMINOPHEN 325 MG PO TABS
650.0000 mg | ORAL_TABLET | Freq: Once | ORAL | Status: AC
  Administered 2021-12-22: 650 mg via ORAL
  Filled 2021-12-22: qty 2

## 2021-12-22 NOTE — Progress Notes (Signed)
Diagnosis: Osteoporosis  Provider:  Marshell Garfinkel MD  Procedure: Infusion  IV Type: Peripheral, IV Location: L Antecubital  Reclast (Zolendronic Acid), Dose: 5 mg  Infusion Start Time: 1478  Infusion Stop Time: 2956  Post Infusion IV Care: Observation period completed and Peripheral IV Discontinued  Discharge: Condition: Good, Destination: Home . AVS provided to patient.   Performed by:  Adelina Mings, LPN

## 2021-12-28 ENCOUNTER — Other Ambulatory Visit: Payer: Self-pay | Admitting: Cardiovascular Disease

## 2022-02-18 ENCOUNTER — Encounter: Payer: Self-pay | Admitting: Pharmacist Clinician (PhC)/ Clinical Pharmacy Specialist

## 2022-02-20 ENCOUNTER — Inpatient Hospital Stay (HOSPITAL_BASED_OUTPATIENT_CLINIC_OR_DEPARTMENT_OTHER): Payer: Medicare Other | Admitting: Hematology and Oncology

## 2022-02-20 ENCOUNTER — Other Ambulatory Visit: Payer: Self-pay | Admitting: Hematology and Oncology

## 2022-02-20 ENCOUNTER — Inpatient Hospital Stay: Payer: Medicare Other | Attending: Hematology and Oncology

## 2022-02-20 VITALS — BP 142/81 | HR 63 | Temp 98.0°F | Resp 13 | Wt 157.5 lb

## 2022-02-20 DIAGNOSIS — D472 Monoclonal gammopathy: Secondary | ICD-10-CM | POA: Diagnosis not present

## 2022-02-20 DIAGNOSIS — Z87891 Personal history of nicotine dependence: Secondary | ICD-10-CM | POA: Insufficient documentation

## 2022-02-20 DIAGNOSIS — Z79899 Other long term (current) drug therapy: Secondary | ICD-10-CM | POA: Diagnosis not present

## 2022-02-20 LAB — CBC WITH DIFFERENTIAL (CANCER CENTER ONLY)
Abs Immature Granulocytes: 0 10*3/uL (ref 0.00–0.07)
Basophils Absolute: 0 10*3/uL (ref 0.0–0.1)
Basophils Relative: 0 %
Eosinophils Absolute: 0.2 10*3/uL (ref 0.0–0.5)
Eosinophils Relative: 4 %
HCT: 39.8 % (ref 36.0–46.0)
Hemoglobin: 13 g/dL (ref 12.0–15.0)
Immature Granulocytes: 0 %
Lymphocytes Relative: 49 %
Lymphs Abs: 2.7 10*3/uL (ref 0.7–4.0)
MCH: 31.3 pg (ref 26.0–34.0)
MCHC: 32.7 g/dL (ref 30.0–36.0)
MCV: 95.9 fL (ref 80.0–100.0)
Monocytes Absolute: 0.3 10*3/uL (ref 0.1–1.0)
Monocytes Relative: 6 %
Neutro Abs: 2.2 10*3/uL (ref 1.7–7.7)
Neutrophils Relative %: 41 %
Platelet Count: 256 10*3/uL (ref 150–400)
RBC: 4.15 MIL/uL (ref 3.87–5.11)
RDW: 14 % (ref 11.5–15.5)
WBC Count: 5.5 10*3/uL (ref 4.0–10.5)
nRBC: 0 % (ref 0.0–0.2)

## 2022-02-20 LAB — CMP (CANCER CENTER ONLY)
ALT: 9 U/L (ref 0–44)
AST: 17 U/L (ref 15–41)
Albumin: 3.9 g/dL (ref 3.5–5.0)
Alkaline Phosphatase: 61 U/L (ref 38–126)
Anion gap: 4 — ABNORMAL LOW (ref 5–15)
BUN: 10 mg/dL (ref 8–23)
CO2: 28 mmol/L (ref 22–32)
Calcium: 9.7 mg/dL (ref 8.9–10.3)
Chloride: 108 mmol/L (ref 98–111)
Creatinine: 0.77 mg/dL (ref 0.44–1.00)
GFR, Estimated: >60 mL/min (ref >60)
Glucose, Bld: 74 mg/dL (ref 70–99)
Potassium: 3.6 mmol/L (ref 3.5–5.1)
Sodium: 140 mmol/L (ref 135–145)
Total Bilirubin: 0.3 mg/dL (ref 0.3–1.2)
Total Protein: 7.2 g/dL (ref 6.5–8.1)

## 2022-02-20 LAB — LACTATE DEHYDROGENASE: LDH: 158 U/L (ref 98–192)

## 2022-02-20 NOTE — Progress Notes (Signed)
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 Lebron Conners at [facility]. M protein 0.2, kappa 10.1, lambda 9.6, ratio 1.05. Met survey 11/24/2016 showed no evidence of lytic lesions.  2) 11/10/2017: M protein 0.1, kappa 12.5, lambda 8.2, ratio 1.52.  3) 02/14/2019: establish care with Dr. Lorenso Courier   Interval History:  Vickie Pearson 67 y.o. female with medical history significant for IgA kappa MGUS presents for a follow up visit. The patient's last visit was on 02/20/2021. In the interim since the last visit has had no major changes in her health.  On exam today Vickie Pearson notes she has been well in the year since her last visit.  She reports that she did have a abnormal finding on her mammogram recently and underwent an ultrasound biopsy which was found to be benign.  She reports that she also recently started on Reclast once yearly.  She notes she has had no hospital visits, or ER visits, but did have an urgent care visit for a lingering cough.  She notes that otherwise she has been quite well and has had no major changes in her health.  She otherwise denies having any fevers, chills, sweats, nausea, vomiting or diarrhea.  She has no new bone or back pain.  Full 10 point ROS is listed below.  MEDICAL HISTORY:  Past Medical History:  Diagnosis Date   Colon polyps    Coronary artery calcification of native artery 02/21/2019   Minimal calcification noted 11/2018 on coronary CTA   Costochondritis    DDD (degenerative disc disease), cervical    Degenerative arthritis of knee    Bilateral   Degenerative disc disease, cervical    Degenerative disc disease, cervical    Fibromyalgia     GERD (gastroesophageal reflux disease)    Hyperlipidemia    Inappropriate sinus tachycardia 02/21/2019   Monoclonal gammopathy    Multiple thyroid nodules    Osteoporosis    Rheumatoid arthritis ([place])     SURGICAL HISTORY: Past Surgical History:  Procedure Laterality Date   BREAST BIOPSY Right 2022   COLONOSCOPY  2012   COLONOSCOPY  2015   POLYPECTOMY      SOCIAL HISTORY: Social History   Socioeconomic History   Marital status: Widowed    Spouse name: Not on file   Number of children: Not on file   Years of education: Not on file   Highest education level: Not on file  Occupational History   Not on file  Tobacco Use   Smoking status: Former    Packs/day: 1.00    Types: Cigarettes    Quit date: 07/24/1988    Years since quitting: 33.6    Passive exposure: Never   Smokeless tobacco: Never  Vaping Use   Vaping Use: Never used  Substance and Sexual Activity   Alcohol use: Yes    Comment: occasionally   Drug use: No   Sexual activity: Not on file  Other Topics Concern   Not on file  Social History Narrative   Not on file   Social Determinants of Health   Financial Resource Strain: Not on file  Food Insecurity: Not on file  Transportation Needs: Not on file  Physical Activity: Not on file  Stress: Not  on file  Social Connections: Not on file  Intimate Partner Violence: Not on file    FAMILY HISTORY: Family History  Problem Relation Age of Onset   Hypertension Mother    Colon polyps Mother    Stroke Mother    Arthritis Mother    Hypertension Father    Heart disease Father    Arthritis Father    Kidney disease Father    Hypertension Sister    Hyperlipidemia Sister    Hypertension Brother    Hyperlipidemia Brother    Esophageal cancer Neg Hx    Rectal cancer Neg Hx    Stomach cancer Neg Hx    Osteoporosis Neg Hx     ALLERGIES:  is allergic to penicillins, piroxicam, statins, sulfa antibiotics, and zetia [ezetimibe].  MEDICATIONS:  Current  Outpatient Medications  Medication Sig Dispense Refill   cholecalciferol (VITAMIN D) 1000 units tablet Take 3,000 Units by mouth daily.     Evolocumab with Infusor (Eva) 420 MG/3.5ML SOCT INJECT 1 once cartridge ONCE EVERY MONTH 3.4 mL 11   fluticasone (FLONASE) 50 MCG/ACT nasal spray Place 1 spray into both nostrils as needed for allergies or rhinitis.     hydrOXYzine (VISTARIL) 25 MG capsule Take 1 capsule (25 mg total) by mouth 2 (two) times daily as needed. 30 capsule 1   Lactobacillus (PROBIOTIC ACIDOPHILUS PO) Take 1 capsule by mouth daily.     Magnesium Citrate 100 MG TABS Take by mouth daily as needed.      metoprolol succinate (TOPROL-XL) 25 MG 24 hr tablet TAKE 1 TABLET BY MOUTH ONCE DAILY WITH FOOD OR  IMMEDIATELY  FOLLOWING  A  MEAL 90 tablet 0   vitamin B-12 (CYANOCOBALAMIN) 1000 MCG tablet Take 1 tablet by mouth daily.     zinc sulfate 220 (50 Zn) MG capsule Take by mouth daily.      Zoledronic Acid (RECLAST IV)      No current facility-administered medications for this visit.    REVIEW OF SYSTEMS:   Constitutional: ( - ) fevers, ( - )  chills , ( - ) night sweats Eyes: ( - ) blurriness of vision, ( - ) double vision, ( - ) watery eyes Ears, nose, mouth, throat, and face: ( - ) mucositis, ( - ) sore throat Respiratory: ( - ) cough, ( - ) dyspnea, ( - ) wheezes Cardiovascular: ( - ) palpitation, ( - ) chest discomfort, ( - ) lower extremity swelling Gastrointestinal:  ( - ) nausea, ( - ) heartburn, ( - ) change in bowel habits Skin: ( - ) abnormal skin rashes Lymphatics: ( - ) new lymphadenopathy, ( - ) easy bruising Neurological: ( - ) numbness, ( - ) tingling, ( - ) new weaknesses Behavioral/Psych: ( - ) mood change, ( - ) new changes  All other systems were reviewed with the patient and are negative.  PHYSICAL EXAMINATION: ECOG PERFORMANCE STATUS: 1 - Symptomatic but completely ambulatory  Vitals:   02/20/22 1057  BP: (!) 142/81  Pulse: 63   Resp: 13  Temp: 98 F (36.7 C)  SpO2: 100%    Filed Weights   02/20/22 1057  Weight: 157 lb 8 oz (71.4 kg)     GENERAL: well appearing elderly African American female alert, no distress and comfortable SKIN: skin color, texture, turgor are normal, no rashes or significant lesions EYES: conjunctiva are pink and non-injected, sclera clear LUNGS: clear to auscultation and percussion with normal breathing effort HEART: regular  rate & rhythm and no murmurs and no lower extremity edema Musculoskeletal: no cyanosis of digits and no clubbing  PSYCH: alert & oriented x 3, fluent speech NEURO: no focal motor/sensory deficits  LABORATORY DATA:  I have reviewed the data as listed    Latest Ref Rng & Units 02/20/2022   10:32 AM 02/20/2021   10:18 AM 07/02/2020   10:38 AM  CBC  WBC 4.0 - 10.5 K/uL 5.5  5.5  5.8   Hemoglobin 12.0 - 15.0 g/dL 13.0  13.1  13.7   Hematocrit 36.0 - 46.0 % 39.8  39.3  41.1   Platelets 150 - 400 K/uL 256  235  253        Latest Ref Rng & Units 02/20/2022   10:32 AM 11/18/2021    2:09 PM 02/20/2021   10:18 AM  CMP  Glucose 70 - 99 mg/dL 74  99  76   BUN 8 - 23 mg/dL '10  13  14   '$ Creatinine 0.44 - 1.00 mg/dL 0.77  0.82  0.86   Sodium 135 - 145 mmol/L 140  140  141   Potassium 3.5 - 5.1 mmol/L 3.6  3.7  3.7   Chloride 98 - 111 mmol/L 108  105  109   CO2 22 - 32 mmol/L '28  29  26   '$ Calcium 8.9 - 10.3 mg/dL 9.7  9.9  9.3   Total Protein 6.5 - 8.1 g/dL 7.2   7.0   Total Bilirubin 0.3 - 1.2 mg/dL 0.3   0.4   Alkaline Phos 38 - 126 U/L 61   64   AST 15 - 41 U/L 17   14   ALT 0 - 44 U/L 9   8     Lab Results  Component Value Date   MPROTEIN Not Observed 02/20/2021   MPROTEIN Not Observed 02/21/2020   MPROTEIN 0.2 (H) 02/15/2019   Lab Results  Component Value Date   KPAFRELGTCHN 18.7 02/20/2021   KPAFRELGTCHN 16.5 02/21/2020   KPAFRELGTCHN 17.5 02/15/2019   LAMBDASER 10.4 02/20/2021   LAMBDASER 10.1 02/21/2020   LAMBDASER 12.0 02/15/2019    KAPLAMBRATIO 8.99 03/03/2021   KAPLAMBRATIO 1.80 (H) 02/20/2021   KAPLAMBRATIO 1.63 02/21/2020    RADIOGRAPHIC STUDIES: No results found.  ASSESSMENT & PLAN Vickie Pearson 67 y.o. female with medical history significant for IgA kappa MGUS  presents for a follow up visit.    After review of the labs and discussion with the patient, her finding is most consistent with monoclonal gammopathy of undetermined significance.  It appears at this has been very stable over the last 2 years, with a very low M protein.  Given these findings I think would be reasonable to continue to observe on a yearly basis with SPEP, UPEP, and serum free light chains.   Technically the patient meets the criteria for a bone marrow biopsy with an IgA MGUS, however given the extremely low levels of M protein and the lack of any anemia or increased calcium I think that observation would be appropriate at this time.   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 --yearly metastatic survey is due (last in Jan 2023) --M protein is only mildly elevated and Hgb/Ca/Cr are WNL. As such I recommend holding on a bone marrow biopsy at this time. --labs today show white blood cell 5.5, hemoglobin 13.0, MCV 95.9, and platelets of 256 --RTC in 1 years time for repeat serologies and  metastatic survey.   Orders Placed This Encounter  Procedures   DG Bone Survey Met    Standing Status:   Future    Standing Expiration Date:   02/21/2023    Order Specific Question:   Reason for Exam (SYMPTOM  OR DIAGNOSIS REQUIRED)    Answer:   MGUS, assess for lytic lesions    Order Specific Question:   Preferred imaging location?    Answer:   [facility]    All questions were answered. The patient knows to call the clinic with any problems, questions or concerns.  A total of more than 25 minutes were spent on this encounter and over half of that time was spent on counseling and  coordination of care as outlined above.   Ledell Peoples, MD Department of Hematology/Oncology [place] at [facility] Phone: [phone] Pager: [phone] Email: [email or web address]  02/21/2022 3:20 PM

## 2022-02-21 ENCOUNTER — Encounter: Payer: Self-pay | Admitting: Internal Medicine

## 2022-02-21 LAB — BETA 2 MICROGLOBULIN, SERUM: Beta-2 Microglobulin: 1.9 mg/L (ref 0.6–2.4)

## 2022-02-23 LAB — KAPPA/LAMBDA LIGHT CHAINS
Kappa free light chain: 22.7 mg/L — ABNORMAL HIGH (ref 3.3–19.4)
Kappa, lambda light chain ratio: 1.69 — ABNORMAL HIGH (ref 0.26–1.65)
Lambda free light chains: 13.4 mg/L (ref 5.7–26.3)

## 2022-02-26 LAB — MULTIPLE MYELOMA PANEL, SERUM
Albumin SerPl Elph-Mcnc: 3.7 g/dL (ref 2.9–4.4)
Albumin/Glob SerPl: 1.2 (ref 0.7–1.7)
Alpha 1: 0.2 g/dL (ref 0.0–0.4)
Alpha2 Glob SerPl Elph-Mcnc: 0.7 g/dL (ref 0.4–1.0)
B-Globulin SerPl Elph-Mcnc: 1 g/dL (ref 0.7–1.3)
Gamma Glob SerPl Elph-Mcnc: 1.3 g/dL (ref 0.4–1.8)
Globulin, Total: 3.2 g/dL (ref 2.2–3.9)
IgA: 287 mg/dL (ref 87–352)
IgG (Immunoglobin G), Serum: 1272 mg/dL (ref 586–1602)
IgM (Immunoglobulin M), Srm: 49 mg/dL (ref 26–217)
Total Protein ELP: 6.9 g/dL (ref 6.0–8.5)

## 2022-02-27 ENCOUNTER — Other Ambulatory Visit: Payer: Self-pay | Admitting: *Deleted

## 2022-02-27 ENCOUNTER — Telehealth: Payer: Self-pay | Admitting: Internal Medicine

## 2022-02-27 ENCOUNTER — Ambulatory Visit (HOSPITAL_COMMUNITY)
Admission: RE | Admit: 2022-02-27 | Discharge: 2022-02-27 | Disposition: A | Discharge status: Home / Self Care | Payer: Medicare Other | Source: Ambulatory Visit | Attending: Hematology and Oncology | Admitting: Hematology and Oncology

## 2022-02-27 DIAGNOSIS — D472 Monoclonal gammopathy: Secondary | ICD-10-CM | POA: Diagnosis present

## 2022-02-27 NOTE — Telephone Encounter (Signed)
Left message for patient to call back and schedule Medicare Annual Wellness Visit (AWV) either virtually or phone    Left  my Vickie Pearson number [phone]   Last AWV 10/11/20

## 2022-03-02 ENCOUNTER — Other Ambulatory Visit (HOSPITAL_COMMUNITY): Payer: Self-pay

## 2022-03-02 ENCOUNTER — Encounter: Payer: Self-pay | Admitting: Internal Medicine

## 2022-03-03 LAB — UPEP/UIFE/LIGHT CHAINS/TP, 24-HR UR
% BETA, Urine: 0 %
ALPHA 1 URINE: 0 %
Albumin, U: 100 %
Alpha 2, Urine: 0 %
Free Kappa Lt Chains,Ur: 18.92 mg/L (ref 1.17–86.46)
Free Kappa/Lambda Ratio: 13.42 (ref 1.83–14.26)
Free Lambda Lt Chains,Ur: 1.41 mg/L (ref 0.27–15.21)
GAMMA GLOBULIN URINE: 0 %
Total Protein, Urine-Ur/day: 63 mg/24 hr (ref 30–150)
Total Protein, Urine: 5 mg/dL
Total Volume: 1250

## 2022-03-09 ENCOUNTER — Telehealth: Payer: Self-pay

## 2022-03-09 NOTE — Telephone Encounter (Addendum)
Spoke with patient to advise of the following message. Patient voiced appreciation.   ----- Message from Orson Slick, MD sent at 03/08/2022 12:12 PM EST ----- Please let Vickie Pearson know that there were no concerning abnormalities on her bone scan. We will see her back in 1 year as scheduled.   ----- Message ----- From: Buel Ream, Rad Results In Sent: 02/28/2022  11:44 PM EST To: Orson Slick, MD

## 2022-03-16 NOTE — Progress Notes (Unsigned)
Office Visit Note  Patient: Vickie Pearson             Date of Birth: 08/15/1955           MRN: [id number]             PCP: Ladell Pier, MD Referring: Ladell Pier, MD Visit Date: 03/17/2022 Occupation: '@GUAROCC'$ @  Subjective:  Pain in both knees  History of Present Illness: Vickie Pearson is a 67 y.o. female with history of seropositive rheumatoid arthritis, osteoarthritis, degenerative disc disease and osteoporosis.  She returns today after her last visit in August 2023.  She complains of severe pain and discomfort in her bilateral knee joints.  She denies any history of joint swelling.  She had good response to cortisone injections.  She is requesting repeat cortisone injections today.  She has been off DMARDs and Biologics and does not want to take any immunosuppressive therapy.  She has been getting Reclast infusions for osteoporosis to her endocrinologist.    Activities of Daily Living:  Patient reports morning stiffness for 20 minutes.   Patient Reports nocturnal pain.  Difficulty dressing/grooming: Denies Difficulty climbing stairs: Reports Difficulty getting out of chair: Reports Difficulty using hands for taps, buttons, cutlery, and/or writing: Denies  Review of Systems  Constitutional:  Positive for fatigue.  HENT: Negative.  Negative for mouth sores and mouth dryness.   Eyes:  Positive for dryness.  Respiratory:  Negative for shortness of breath.   Cardiovascular:  Negative for palpitations.  Gastrointestinal: Negative.  Negative for blood in stool, constipation and diarrhea.  Endocrine: Negative.  Negative for increased urination.  Genitourinary: Negative.  Negative for involuntary urination.  Musculoskeletal:  Positive for joint pain, gait problem, joint pain, myalgias, muscle weakness, morning stiffness and myalgias. Negative for joint swelling and muscle tenderness.  Skin: Negative.  Negative for color change, rash, hair loss and  sensitivity to sunlight.  Allergic/Immunologic: Negative.  Negative for susceptible to infections.  Neurological:  Positive for dizziness and headaches.  Hematological: Negative.  Negative for swollen glands.  Psychiatric/Behavioral:  Positive for sleep disturbance. Negative for depressed mood. The patient is not nervous/anxious.     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   Hx of abnormal cervical Pap smear 07/24/2016   OA (osteoarthritis) of knee 07/24/2016    Past Medical History:  Diagnosis Date   Colon polyps    Coronary artery calcification of native artery 02/21/2019   Minimal calcification noted 11/2018 on coronary CTA   Costochondritis    DDD (degenerative disc disease), cervical    Degenerative arthritis of knee    Bilateral   Degenerative disc disease, cervical    Degenerative disc disease, cervical    Fibromyalgia    GERD (gastroesophageal reflux disease)    Hyperlipidemia    Inappropriate sinus tachycardia 02/21/2019   Monoclonal gammopathy    Multiple thyroid nodules    Osteoporosis    Rheumatoid arthritis ([place])     Family  History  Problem Relation Age of Onset   Hypertension Mother    Colon polyps Mother    Stroke Mother    Arthritis Mother    Hypertension Father    Heart disease Father    Arthritis Father    Kidney disease Father    Hypertension Sister    Hyperlipidemia Sister     Hypertension Brother    Hyperlipidemia Brother    Esophageal cancer Neg Hx    Rectal cancer Neg Hx    Stomach cancer Neg Hx    Osteoporosis Neg Hx    Past Surgical History:  Procedure Laterality Date   BREAST BIOPSY Right 2022   COLONOSCOPY  2012   COLONOSCOPY  2015   POLYPECTOMY     Social History   Social History Narrative   Not on file   Immunization History  Administered Date(s) Administered   Fluad Quad(high Dose 65+) 02/21/2020   Influenza, High Dose Seasonal PF 11/10/2018   Influenza, Quadrivalent, Recombinant, Inj, Pf 11/15/2017   Influenza,inj,Quad PF,6+ Mos 11/09/2016, 11/10/2018   PFIZER(Purple Top)SARS-COV-2 Vaccination 03/17/2019, 04/07/2019, 11/17/2019     Objective: Vital Signs: BP 131/75 (BP Location: Left Arm, Patient Position: Sitting, Cuff Size: Normal)   Pulse 69   Ht '5\' 7"'$  (1.702 m)   Wt 156 lb 9.6 oz (71 kg)   BMI 24.53 kg/m    Physical Exam Vitals and nursing note reviewed.  Constitutional:      Appearance: She is well-developed.  HENT:     Head: Normocephalic and atraumatic.  Eyes:     Conjunctiva/sclera: Conjunctivae normal.  Cardiovascular:     Rate and Rhythm: Normal rate and regular rhythm.     Heart sounds: Normal heart sounds.  Pulmonary:     Effort: Pulmonary effort is normal.     Breath sounds: Normal breath sounds.  Abdominal:     General: Bowel sounds are normal.     Palpations: Abdomen is soft.  Musculoskeletal:     Cervical back: Normal range of motion.  Lymphadenopathy:     Cervical: No cervical adenopathy.  Skin:    General: Skin is warm and dry.     Capillary Refill: Capillary refill takes less than 2 seconds.  Neurological:     Mental Status: She is alert and oriented to person, place, and time.  Psychiatric:        Behavior: Behavior normal.      Musculoskeletal Exam: Cervical spine was in good range of motion.  Shoulder joints, and elbow joints in good range of motion.  She had limited extension in bilateral  wrist joints.  Tenosynovitis was noted over the left wrist joint.  There was no synovitis over MCP joints.  PIP and DIP thickening was noted.  She had limited range of motion of her hip joints send limited extension of her knee joints without any warmth swelling or effusion.  There was no tenderness over ankles or MTPs.  CDAI Exam: CDAI Score: 4.2  Patient Global: 1 mm; Provider Global: 1 mm Swollen: 1 ; Tender: 3  Joint Exam 03/17/2022      Right  Left  Wrist     Swollen Tender  Knee   Tender   Tender     Investigation: No additional findings.  Imaging: DG Bone Survey Met  Result Date: 02/28/2022 CLINICAL DATA:  MGUS, assess for lytic lesions. EXAM: METASTATIC BONE SURVEY COMPARISON:  02/27/2021. FINDINGS: No lytic or destructive lesion is seen within the bones. Degenerative changes are present at  the acromioclavicular joints bilaterally, cervical, thoracic, and lumbar spine. Mild scoliosis is present in the thoracic spine. There are moderate degenerative changes at the wrists and knees bilaterally. Atherosclerotic calcification of the aorta is noted. IMPRESSION: No lytic or destructive lesions in the bones. Electronically Signed   By: Brett Fairy M.D.   On: 02/28/2022 23:42    Recent Labs: Lab Results  Component Value Date   WBC 5.5 02/20/2022   HGB 13.0 02/20/2022   PLT 256 02/20/2022   NA 140 02/20/2022   K 3.6 02/20/2022   CL 108 02/20/2022   CO2 28 02/20/2022   GLUCOSE 74 02/20/2022   BUN 10 02/20/2022   CREATININE 0.77 02/20/2022   BILITOT 0.3 02/20/2022   ALKPHOS 61 02/20/2022   AST 17 02/20/2022   ALT 9 02/20/2022   PROT 7.2 02/20/2022   ALBUMIN 3.9 02/20/2022   CALCIUM 9.7 02/20/2022   GFRAA 78 07/02/2020    Speciality Comments: No specialty comments available.  Procedures:  Large Joint Inj: bilateral knee on 03/17/2022 12:26 PM Indications: pain Details: 27 G 1.5 in needle, medial approach  Arthrogram: No  Medications (Right): 1.5 mL lidocaine 1 %; 40  mg triamcinolone acetonide 40 MG/ML Aspirate (Right): 0 mL Medications (Left): 1.5 mL lidocaine 1 %; 40 mg triamcinolone acetonide 40 MG/ML Aspirate (Left): 0 mL Outcome: tolerated well, no immediate complications Procedure, treatment alternatives, risks and benefits explained, specific risks discussed. Consent was given by the patient. Immediately prior to procedure a time out was called to verify the correct patient, procedure, equipment, support staff and site/side marked as required. Patient was prepped and draped in the usual sterile fashion.     Allergies: Penicillins, Piroxicam, Statins, Sulfa antibiotics, and Zetia [ezetimibe]   Assessment / Plan:     Visit Diagnoses: Rheumatoid arthritis of multiple sites with negative rheumatoid factor (HCC)-she has history of severe erosive rheumatoid arthritis.  She does not want to take any immunosuppressive agents.  She declined repeat x-rays.  She understands the risks of untreated rheumatoid arthritis including the complications of rheumatoid arthritis which may include vasculitis, ILD and other systemic involvement.  Chronic iritis of both eyes-  Chronic pain of both knees-she continues to have pain and disc for the bilateral knee joints.  She had limited extension due to severe osteoarthritis.  She is not interested in getting total knee replacement.  She had good response to injections in August 2023.  She requested repeat cortisone injection to her bilateral knee joints today.  After informed consent was obtained bilateral knee joints were prepped in sterile fashion and injected with lidocaine and cortisone as described above.  Patient tolerated the procedure well.  Postprocedure instructions were given.  A handout on lower extremity exercises was given.  Primary osteoarthritis of both knees - Severe end-stage rheumatoid arthritis of both knees. s/p bilateral knee cortisone injections on 10/07/2021.  DDD (degenerative disc disease),  cervical-she had good range of motion of the cervical spine.  DDD (degenerative disc disease), lumbar-she has intermittent discomfort in the lumbar spine.  Age-related osteoporosis without current pathological fracture - DEXA updated on 07/07/19: The BMD measured at Femur Neck Left is 0.624 g/cm2 with a T-score of-3.0.Reclast 2017, 2022 by Dr. Loanne Drilling.  Vitamin D deficiency  Mixed hyperlipidemia  Multiple thyroid nodules  Monoclonal gammopathy-followed by oncology.  Orders: Orders Placed This Encounter  Procedures   Large Joint Inj   No orders of the defined types were placed in this encounter.   Follow-Up Instructions: Return in  about 6 months (around 09/15/2022) for Rheumatoid arthritis, Osteoarthritis.   Bo Merino, MD  Note - This record has been created using Editor, commissioning.  Chart creation errors have been sought, but may not always  have been located. Such creation errors do not reflect on  the standard of medical care.

## 2022-03-17 ENCOUNTER — Encounter: Payer: Self-pay | Admitting: Rheumatology

## 2022-03-17 ENCOUNTER — Ambulatory Visit: Payer: Medicare Other | Attending: Rheumatology | Admitting: Rheumatology

## 2022-03-17 VITALS — BP 131/75 | HR 69 | Ht 67.0 in | Wt 156.6 lb

## 2022-03-17 DIAGNOSIS — E042 Nontoxic multinodular goiter: Secondary | ICD-10-CM | POA: Diagnosis present

## 2022-03-17 DIAGNOSIS — M5030 Other cervical disc degeneration, unspecified cervical region: Secondary | ICD-10-CM | POA: Diagnosis present

## 2022-03-17 DIAGNOSIS — M25561 Pain in right knee: Secondary | ICD-10-CM | POA: Diagnosis present

## 2022-03-17 DIAGNOSIS — G8929 Other chronic pain: Secondary | ICD-10-CM | POA: Diagnosis present

## 2022-03-17 DIAGNOSIS — D472 Monoclonal gammopathy: Secondary | ICD-10-CM | POA: Diagnosis present

## 2022-03-17 DIAGNOSIS — H2013 Chronic iridocyclitis, bilateral: Secondary | ICD-10-CM

## 2022-03-17 DIAGNOSIS — E559 Vitamin D deficiency, unspecified: Secondary | ICD-10-CM | POA: Diagnosis present

## 2022-03-17 DIAGNOSIS — E782 Mixed hyperlipidemia: Secondary | ICD-10-CM | POA: Diagnosis present

## 2022-03-17 DIAGNOSIS — M25562 Pain in left knee: Secondary | ICD-10-CM

## 2022-03-17 DIAGNOSIS — M170 Bilateral primary osteoarthritis of knee: Secondary | ICD-10-CM

## 2022-03-17 DIAGNOSIS — M0609 Rheumatoid arthritis without rheumatoid factor, multiple sites: Secondary | ICD-10-CM | POA: Diagnosis present

## 2022-03-17 DIAGNOSIS — M51369 Other intervertebral disc degeneration, lumbar region without mention of lumbar back pain or lower extremity pain: Secondary | ICD-10-CM

## 2022-03-17 DIAGNOSIS — M5136 Other intervertebral disc degeneration, lumbar region: Secondary | ICD-10-CM | POA: Diagnosis present

## 2022-03-17 DIAGNOSIS — M810 Age-related osteoporosis without current pathological fracture: Secondary | ICD-10-CM | POA: Diagnosis present

## 2022-03-17 MED ORDER — TRIAMCINOLONE ACETONIDE 40 MG/ML IJ SUSP
40.0000 mg | INTRAMUSCULAR | Status: AC | PRN
  Administered 2022-03-17: 40 mg via INTRA_ARTICULAR

## 2022-03-17 MED ORDER — LIDOCAINE HCL 1 % IJ SOLN
1.5000 mL | INTRAMUSCULAR | Status: AC | PRN
  Administered 2022-03-17: 1.5 mL

## 2022-03-17 NOTE — Patient Instructions (Signed)

## 2022-03-30 ENCOUNTER — Other Ambulatory Visit: Payer: Self-pay | Admitting: Cardiovascular Disease

## 2022-03-30 NOTE — Telephone Encounter (Signed)
Rx(s) sent to pharmacy electronically.  

## 2022-05-14 ENCOUNTER — Ambulatory Visit
Admission: RE | Admit: 2022-05-14 | Discharge: 2022-05-14 | Disposition: A | Discharge status: Home / Self Care | Payer: Medicare Other | Source: Ambulatory Visit | Attending: Internal Medicine | Admitting: Internal Medicine

## 2022-05-14 DIAGNOSIS — M810 Age-related osteoporosis without current pathological fracture: Secondary | ICD-10-CM

## 2022-05-26 ENCOUNTER — Other Ambulatory Visit: Payer: Self-pay | Admitting: Internal Medicine

## 2022-05-26 DIAGNOSIS — F419 Anxiety disorder, unspecified: Secondary | ICD-10-CM

## 2022-06-02 ENCOUNTER — Ambulatory Visit: Payer: Medicare Other | Admitting: Rheumatology

## 2022-06-19 ENCOUNTER — Telehealth: Payer: Self-pay | Admitting: Internal Medicine

## 2022-06-19 NOTE — Telephone Encounter (Signed)
Copied from CRM [id number]. Topic: Medicare AWV >> Jun 19, 2022  2:52 PM Rushie Goltz wrote: Reason for CRM: Called patient to schedule Medicare Annual Wellness Visit (AWV). Left message for patient to call back and schedule Medicare Annual Wellness Visit (AWV).  Last date of AWV: 10/11/2020  Please schedule an AWVS appointment at any time with [facility] VISIT.  If any questions, please contact me at [phone].    Thank you,  [facility] Support [facility] Direct dial  [phone]

## 2022-06-23 ENCOUNTER — Other Ambulatory Visit: Payer: Self-pay | Admitting: Cardiovascular Disease

## 2022-06-23 NOTE — Telephone Encounter (Signed)
Please call pt to schedule overdue 1 year follow-up with Dr. Duke Salvia or APP for refills. Thank you!

## 2022-06-23 NOTE — Telephone Encounter (Signed)
Rx request sent to pharmacy.  

## 2022-06-23 NOTE — Telephone Encounter (Signed)
Scheduled 06/25/22 with Dr. Duke Salvia

## 2022-06-25 ENCOUNTER — Encounter (HOSPITAL_BASED_OUTPATIENT_CLINIC_OR_DEPARTMENT_OTHER): Payer: Self-pay | Admitting: Cardiovascular Disease

## 2022-06-25 ENCOUNTER — Ambulatory Visit (INDEPENDENT_AMBULATORY_CARE_PROVIDER_SITE_OTHER): Payer: Medicare Other | Admitting: Cardiovascular Disease

## 2022-06-25 VITALS — BP 118/82 | HR 71 | Ht 67.0 in | Wt 157.6 lb

## 2022-06-25 DIAGNOSIS — I2510 Atherosclerotic heart disease of native coronary artery without angina pectoris: Secondary | ICD-10-CM | POA: Diagnosis not present

## 2022-06-25 DIAGNOSIS — E7849 Other hyperlipidemia: Secondary | ICD-10-CM | POA: Diagnosis not present

## 2022-06-25 DIAGNOSIS — E785 Hyperlipidemia, unspecified: Secondary | ICD-10-CM | POA: Diagnosis not present

## 2022-06-25 DIAGNOSIS — I2584 Coronary atherosclerosis due to calcified coronary lesion: Secondary | ICD-10-CM

## 2022-06-25 NOTE — Patient Instructions (Signed)
Medication Instructions:  Your physician recommends that you continue on your current medications as directed. Please refer to the Current Medication list given to you today.  *If you need a refill on your cardiac medications before your next appointment, please call your pharmacy*   Lab Work: Please return for Lab work within the next week or so for Fasting NMR and Lpa . You may come to the...   [facility] (3rd floor) [address] [place], [place] [address]  Open: 8am-Noon and 1pm-4:30pm  Please ring the doorbell on the small table when you exit the elevator and the Lab Tech will come get you  [facility] at [facility] [address], [place], [place] [address] Open: 8am-1pm, then 2pm-4:30pm   [facility]- Please see attached locations sheet stapled to your lab work with address and hours.   If you have labs (blood work) drawn today and your tests are completely normal, you will receive your results only by: MyChart Message (if you have MyChart) OR A paper copy in the mail If you have any lab test that is abnormal or we need to change your treatment, we will call you to review the results.  Follow-Up: At [facility], you and your health needs are our priority.  As part of our continuing mission to provide you with exceptional heart care, we have created designated Provider Care Teams.  These Care Teams include your primary Cardiologist (physician) and Advanced Practice Providers (APPs -  Physician Assistants and Nurse Practitioners) who all work together to provide you with the care you need, when you need it.  We recommend signing up for the patient portal called "MyChart".  Sign up information is provided on this After Visit Summary.  MyChart is used to connect with patients for Virtual Visits (Telemedicine).  Patients are able to view lab/test results, encounter notes, upcoming appointments, etc.  Non-urgent messages can be  sent to your provider as well.   To learn more about what you can do with MyChart, go to [email or web address].    Your next appointment:   1 year(s)  Provider:   Chilton Si, MD    Other Instructions We have referred you to the lipid clinic, their scheduling team will reach out to get you scheduled

## 2022-06-25 NOTE — Assessment & Plan Note (Signed)
Nonobstructive disease on coronary CT-a in 2021.  She does continue to have some chest pain.  However it is not exertional and very atypical.  We discussed why it is not necessary to have any additional testing at this time.  Continue Repatha.  Continue metoprolol.

## 2022-06-25 NOTE — Assessment & Plan Note (Signed)
Pressure uncontrolled on Repatha.  She has not tolerated statins or Zetia.  We did recommend trying Nexletol and she declined.  We will check an NMR, CMP, and LP(a).  Recommend that she did see Dr. Rennis Golden for additional recommendations.

## 2022-06-25 NOTE — Progress Notes (Signed)
Cardiology Office Note  Date:  06/25/2022   ID:  METRA MOST, DOB 29-Nov-1955, MRN [id number]  PCP:  Marcine Matar, MD  Cardiologist:   Chilton Si, MD    History of Present Illness: Vickie Pearson is a 67 y.o. female with inappropriate sinus tachycardia, Rheumatoid arthritis, non-obstructive CAD, MGUS, GERD, fibromyalgia, and familial hyperlipidemia here for follow up. Vickie Pearson was initially seen virtually 12/2018 with reports of intermittent episodes of heart palpitations.  It was ongoing for the preceding 4 months but seemed to be happening more recently.  It felt like a fluttering sensation and occurred both at rest and with exertion.  Episodes lasted between 15 and 20 seconds.  She sometimes felt lightheaded or dizzy.  Vickie Pearson was seen in the ED 11/24/2018 with chest pain and palpitations.  EKG did show 1 PVC but otherwise no significant arrhythmias.  Cardiac enzymes were negative.  She wore a 7-day event monitor that revealed no arrhythmias.  However she was noted to be tachycardic to the 140s at rest.  She reported atypical chest pain and had a coronary CT-a 02/2019 that revealed minimal obstruction.  Her calcium score was 50.3, which is 80th percentile for age and gender.    Vickie Pearson LDL was 251.  Her sister also has very high lipids. She reported that her diet was good. She tried taking pravastatin in the past but developed myalgias.  She was referred to the lipid clinic and started on Repatha.  Metoprolol was increased to 75 mg to help with palpitations. She followed-up with Vickie Shields, NP 09/2020 and was grieving the loss of her brother but otherwise well.   At her visit in 05/2021, she complained of occasional sharp chest pain with lifting that she associated with costochondritis. Every few months she experienced a brief fluttering in her heart. She had discontinued Zetia due to LE muscle cramping. Her lipids remained  uncontrolled on Repatha. We added 180 mg Nexletol daily.  Today, she complains of intermittent but infrequent chest pains. Most recently this occurred a few days ago following an upper body workout which is something she doesn't usually do for exercise. After completing the exercise she was in pain. She denies having the chest pain during her exercise. Generally she is not sure if her chest pain is attributable to her heart, GERD, or costochondritis. It has been difficult to differentiate between the types of pain. Sometimes her pain can be manipulated with palpation. Also she complains of some new pain for a few days localized to the top of her right foot. Of note, she did not start the Nexletol due to fear of developing side effects. She denies any palpitations, shortness of breath, or peripheral edema. No lightheadedness, headaches, syncope, orthopnea, or PND.   Past Medical History:  Diagnosis Date   Colon polyps    Coronary artery calcification of native artery 02/21/2019   Minimal calcification noted 11/2018 on coronary CTA   Costochondritis    DDD (degenerative disc disease), cervical    Degenerative arthritis of knee    Bilateral   Degenerative disc disease, cervical    Degenerative disc disease, cervical    Fibromyalgia    GERD (gastroesophageal reflux disease)    Hyperlipidemia    Inappropriate sinus tachycardia 02/21/2019   Monoclonal gammopathy    Multiple thyroid nodules    Osteoporosis    Rheumatoid arthritis [facility])     Past Surgical History:  Procedure Laterality Date   BREAST BIOPSY Right 2022  COLONOSCOPY  2012   COLONOSCOPY  2015   POLYPECTOMY       Current Outpatient Medications  Medication Sig Dispense Refill   cholecalciferol (VITAMIN D) 1000 units tablet Take 3,000 Units by mouth daily.     Evolocumab with Infusor (REPATHA PUSHTRONEX SYSTEM) 420 MG/3.5ML SOCT INJECT 1 once cartridge ONCE EVERY MONTH 3.4 mL 11   fluticasone (FLONASE) 50 MCG/ACT nasal spray  Place 1 spray into both nostrils as needed for allergies or rhinitis.     hydrOXYzine (VISTARIL) 25 MG capsule Take 1 capsule (25 mg total) by mouth 2 (two) times daily as needed for anxiety. Please make PCP appt. 30 capsule 0   Lactobacillus (PROBIOTIC ACIDOPHILUS PO) Take 1 capsule by mouth daily.     Magnesium Citrate 100 MG TABS Take by mouth daily as needed.      metoprolol succinate (TOPROL-XL) 25 MG 24 hr tablet Take 1 tablet (25 mg total) by mouth daily. Please keep your upcoming appointment for refills. 90 tablet 0   vitamin B-12 (CYANOCOBALAMIN) 1000 MCG tablet Take 1 tablet by mouth daily.     zinc sulfate 220 (50 Zn) MG capsule Take by mouth daily.      Zoledronic Acid (RECLAST IV)      No current facility-administered medications for this visit.    Allergies:   Penicillins, Piroxicam, Statins, Sulfa antibiotics, and Zetia [ezetimibe]    Social History:  The patient  reports that she quit smoking about 33 years ago. Her smoking use included cigarettes. She smoked an average of 1 pack per day. She has never been exposed to tobacco smoke. She has never used smokeless tobacco. She reports current alcohol use. She reports that she does not use drugs.   Family History:  The patient's family history includes Arthritis in her father and mother; Colon polyps in her mother; Heart disease in her father; Hyperlipidemia in her brother and sister; Hypertension in her brother, father, mother, and sister; Kidney disease in her father; Stroke in her mother.    ROS:   Please see the history of present illness.    (+) Chest pain (+) Right foot pain All other systems are reviewed and negative.    PHYSICAL EXAM: VS:  BP 118/82 (BP Location: Left Arm, Patient Position: Sitting, Cuff Size: Normal)   Pulse 71   Ht 5\' 7"  (1.702 m)   Wt 157 lb 9.6 oz (71.5 kg)   BMI 24.68 kg/m  , BMI Body mass index is 24.68 kg/m. GENERAL:  Well appearing HEENT: Pupils equal round and reactive, fundi not  visualized, oral mucosa unremarkable NECK:  No jugular venous distention, waveform within normal limits, carotid upstroke brisk and symmetric, no bruits LUNGS:  Clear to auscultation bilaterally HEART:  RRR.  PMI not displaced or sustained,S1 and S2 within normal limits, no S3, no S4, no clicks, no rubs, no murmurs ABD:  Flat, positive bowel sounds normal in frequency in pitch, no bruits, no rebound, no guarding, no midline pulsatile mass, no hepatomegaly, no splenomegaly EXT:  2 plus pulses throughout, no edema, no cyanosis no clubbing SKIN:  No rashes no nodules NEURO:  Cranial nerves II through XII grossly intact, motor grossly intact throughout PSYCH:  Cognitively intact, oriented to person place and time  EKG:   EKG is personally reviewed. 06/25/2022:  Sinus rhythm. Rate 71 bpm. 06/02/21: Sinus rhythm. Rate 67 bpm. 07/17/2019: Sinus rhythm.  Rate 65 bpm. 11/24/18: sinus rhythm.  Rate 81 bpm.  PVC.  Recent Labs: 02/20/2022:  ALT 9; BUN 10; Creatinine 0.77; Hemoglobin 13.0; Platelet Count 256; Potassium 3.6; Sodium 140    Lipid Panel    Component Value Date/Time   CHOL 205 (H) 09/30/2020 1212   TRIG 107 09/30/2020 1212   HDL 65 09/30/2020 1212   CHOLHDL 3.2 09/30/2020 1212   LDLCALC 121 (H) 09/30/2020 1212     Wt Readings from Last 3 Encounters:  06/25/22 157 lb 9.6 oz (71.5 kg)  03/17/22 156 lb 9.6 oz (71 kg)  02/20/22 157 lb 8 oz (71.4 kg)     ASSESSMENT AND PLAN:  Coronary artery calcification of native artery Nonobstructive disease on coronary CT-a in 2021.  She does continue to have some chest pain.  However it is not exertional and very atypical.  We discussed why it is not necessary to have any additional testing at this time.  Continue Repatha.  Continue metoprolol.  Familial hyperlipidemia Pressure uncontrolled on Repatha.  She has not tolerated statins or Zetia.  We did recommend trying Nexletol and she declined.  We will check an NMR, CMP, and LP(a).  Recommend that  she did see Dr. Rennis Golden for additional recommendations.   Disposition:   FU with Jaiyden Laur C. Duke Salvia, MD, [facility] in 1 year.  Medication Adjustments/Labs and Tests Ordered: Current medicines are reviewed at length with the patient today.  Concerns regarding medicines are outlined above.   Orders Placed This Encounter  Procedures   NMR, lipoprofile   Lipoprotein A (LPA)   AMB Referral to [facility]   EKG 12-Lead   No orders of the defined types were placed in this encounter.  Patient Instructions  Medication Instructions:  Your physician recommends that you continue on your current medications as directed. Please refer to the Current Medication list given to you today.  *If you need a refill on your cardiac medications before your next appointment, please call your pharmacy*   Lab Work: Please return for Lab work within the next week or so for Fasting NMR and Lpa . You may come to the...   [facility] (3rd floor) [address] [place], [place] [address]  Open: 8am-Noon and 1pm-4:30pm  Please ring the doorbell on the small table when you exit the elevator and the Lab Tech will come get you  [facility] at [facility] [address], [place], [place] [address] Open: 8am-1pm, then 2pm-4:30pm   [facility]- Please see attached locations sheet stapled to your lab work with address and hours.   If you have labs (blood work) drawn today and your tests are completely normal, you will receive your results only by: MyChart Message (if you have MyChart) OR A paper copy in the mail If you have any lab test that is abnormal or we need to change your treatment, we will call you to review the results.  Follow-Up: At [facility], you and your health needs are our priority.  As part of our continuing mission to provide you with exceptional heart care, we have created designated Provider Care Teams.  These Care Teams  include your primary Cardiologist (physician) and Advanced Practice Providers (APPs -  Physician Assistants and Nurse Practitioners) who all work together to provide you with the care you need, when you need it.  We recommend signing up for the patient portal called "MyChart".  Sign up information is provided on this After Visit Summary.  MyChart is used to connect with patients for Virtual Visits (Telemedicine).  Patients  are able to view lab/test results, encounter notes, upcoming appointments, etc.  Non-urgent messages can be sent to your provider as well.   To learn more about what you can do with MyChart, go to [email or web address].    Your next appointment:   1 year(s)  Provider:   Chilton Si, MD    Other Instructions We have referred you to the lipid clinic, their scheduling team will reach out to get you scheduled     I,Mathew Stumpf,acting as a scribe for Chilton Si, MD.,have documented all relevant documentation on the behalf of Chilton Si, MD,as directed by  Chilton Si, MD while in the presence of Chilton Si, MD.  I, Vilma Will C. Duke Salvia, MD have reviewed all documentation for this visit.  The documentation of the exam, diagnosis, procedures, and orders on 06/25/2022 are all accurate and complete.  Signed, Wallace Cogliano C. Duke Salvia, MD, [facility]  06/25/2022 4:47 PM    [facility]

## 2022-07-31 ENCOUNTER — Other Ambulatory Visit: Payer: Self-pay | Admitting: Internal Medicine

## 2022-07-31 DIAGNOSIS — F419 Anxiety disorder, unspecified: Secondary | ICD-10-CM

## 2022-07-31 MED ORDER — HYDROXYZINE PAMOATE 25 MG PO CAPS
25.0000 mg | ORAL_CAPSULE | Freq: Two times a day (BID) | ORAL | 0 refills | Status: DC | PRN
Start: 2022-07-31 — End: 2022-11-02

## 2022-08-25 ENCOUNTER — Ambulatory Visit: Payer: Medicare Other | Attending: Internal Medicine

## 2022-08-25 VITALS — Ht 67.0 in | Wt 157.0 lb

## 2022-08-25 DIAGNOSIS — Z0000 Encounter for general adult medical examination without abnormal findings: Secondary | ICD-10-CM | POA: Diagnosis not present

## 2022-08-25 DIAGNOSIS — Z1231 Encounter for screening mammogram for malignant neoplasm of breast: Secondary | ICD-10-CM

## 2022-08-25 NOTE — Progress Notes (Signed)
Subjective:   Vickie Pearson is a 67 y.o. female who presents for Medicare Annual (Subsequent) preventive examination.  Visit Complete: Virtual  I connected with  Vickie Pearson on 08/25/22 by a audio enabled telemedicine application and verified that I am speaking with the correct person using two identifiers.  Patient Location: Home  Provider Location: Home Office  I discussed the limitations of evaluation and management by telemedicine. The patient expressed understanding and agreed to proceed.  Patient Medicare AWV questionnaire was completed by the patient on 08/22/22; I have confirmed that all information answered by patient is correct and no changes since this date.  Review of Systems     Cardiac Risk Factors include: advanced age (>59men, >21 women);dyslipidemia  Per patient no change in vitals since last visit, unable to obtain new vitals due to telehealth visit     Objective:    Today's Vitals   08/25/22 1750  Weight: 157 lb (71.2 kg)  Height: 5\' 7"  (1.702 m)   Body mass index is 24.59 kg/m.     08/25/2022    5:54 PM 02/20/2022   11:19 AM 10/11/2020    1:50 PM 02/18/2017   10:16 AM 11/18/2016   10:07 AM 10/01/2016    4:45 PM  Advanced Directives  Does Patient Have a Medical Advance Directive? No No No No No No  Would patient like information on creating a medical advance directive? Yes (MAU/Ambulatory/Procedural Areas - Information given) No - Patient declined  No - Patient declined No - Patient declined No - Patient declined    Current Medications (verified) Outpatient Encounter Medications as of 08/25/2022  Medication Sig   cholecalciferol (VITAMIN D) 1000 units tablet Take 3,000 Units by mouth daily.   Evolocumab with Infusor (REPATHA PUSHTRONEX SYSTEM) 420 MG/3.5ML SOCT INJECT 1 once cartridge ONCE EVERY MONTH   fluticasone (FLONASE) 50 MCG/ACT nasal spray Place 1 spray into both nostrils as needed for allergies or rhinitis.   hydrOXYzine  (VISTARIL) 25 MG capsule Take 1 capsule (25 mg total) by mouth 2 (two) times daily as needed for anxiety. Please make PCP appt.   Lactobacillus (PROBIOTIC ACIDOPHILUS PO) Take 1 capsule by mouth daily.   Magnesium Citrate 100 MG TABS Take by mouth daily as needed.    metoprolol succinate (TOPROL-XL) 25 MG 24 hr tablet Take 1 tablet (25 mg total) by mouth daily. Please keep your upcoming appointment for refills.   vitamin B-12 (CYANOCOBALAMIN) 1000 MCG tablet Take 1 tablet by mouth daily.   zinc sulfate 220 (50 Zn) MG capsule Take by mouth daily.    Zoledronic Acid (RECLAST IV)    No facility-administered encounter medications on file as of 08/25/2022.    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   Monoclonal gammopathy    Multiple thyroid nodules    Osteoporosis    Rheumatoid arthritis (HCC)    Past Surgical History:  Procedure Laterality  Date   BREAST BIOPSY Right 2022   COLONOSCOPY  2012   COLONOSCOPY  2015   POLYPECTOMY     Family History  Problem Relation Age of Onset   Hypertension Mother    Colon polyps Mother    Stroke Mother    Arthritis Mother    Hypertension Father    Heart disease Father    Arthritis Father    Kidney disease Father    Hypertension Sister    Hyperlipidemia Sister    Hypertension Brother    Hyperlipidemia Brother    Esophageal cancer Neg Hx    Rectal cancer Neg Hx     Stomach cancer Neg Hx    Osteoporosis Neg Hx    Social History   Socioeconomic History   Marital status: Widowed    Spouse name: Not on file   Number of children: Not on file   Years of education: Not on file   Highest education level: Not on file  Occupational History   Not on file  Tobacco Use   Smoking status: Former    Current packs/day: 0.00    Types: Cigarettes    Quit date: 07/24/1988    Years since quitting: 34.1    Passive exposure: Never   Smokeless tobacco: Never  Vaping Use   Vaping status: Never Used  Substance and Sexual Activity   Alcohol use: Not Currently    Comment: occasionally   Drug use: No   Sexual activity: Not Currently    Birth control/protection: Abstinence  Other Topics Concern   Not on file  Social History Narrative   Not on file   Social Determinants of Health   Financial Resource Strain: Low Risk  (08/25/2022)   Overall Financial Resource Strain (CARDIA)    Difficulty of Paying Living Expenses: Not very hard  Food Insecurity: No Food Insecurity (08/25/2022)   Hunger Vital Sign    Worried About Running Out of Food in the Last Year: Never true    Ran Out of Food in the Last Year: Never true  Transportation Needs: No Transportation Needs (08/25/2022)   PRAPARE - Administrator, Civil Service (Medical): No    Lack of Transportation (Non-Medical): No  Physical Activity: Sufficiently Active (08/25/2022)   Exercise Vital Sign    Days of Exercise per Week: 4 days    Minutes of Exercise per Session: 50 min  Stress: Stress Concern Present (08/25/2022)   [employer] of Occupational Health - Occupational Stress Questionnaire    Feeling of Stress : To some extent  Social Connections: Moderately Integrated (08/25/2022)   Social Connection and Isolation Panel [NHANES]    Frequency of Communication with Friends and Family: More than three times a week    Frequency of Social Gatherings with Friends and Family: Three times a week     Attends Religious Services: 1 to 4 times per year    Active Member of Clubs or Organizations: Yes    Attends Banker Meetings: More than 4 times per year    Marital Status: Widowed    Tobacco Counseling Counseling given: Not Answered   Clinical Intake:  Pre-visit preparation completed: Yes  Pain : No/denies pain     Diabetes: No  How often do you need to have someone help you when you read instructions, pamphlets, or other written materials from your doctor or pharmacy?: 1 - Never  Interpreter Needed?: No  Information entered by :: Kandis Fantasia LPN   Activities of Daily  Living    08/25/2022    5:53 PM 08/22/2022    9:06 AM  In your present state of health, do you have any difficulty performing the following activities:  Hearing? 0 0  Vision? 0 0  Difficulty concentrating or making decisions? 0 0  Walking or climbing stairs? 0 0  Dressing or bathing? 0 0  Doing errands, shopping? 0 0  Preparing Food and eating ? N N  Using the Toilet? N N  In the past six months, have you accidently leaked urine? N N  Do you have problems with loss of bowel control? N N  Managing your Medications? N N  Managing your Finances? N N  Housekeeping or managing your Housekeeping? N N    Patient Care Team: Marcine Matar, MD as PCP - General (Internal Medicine) Chilton Si, MD as PCP - Cardiology (Cardiology)  Indicate any recent Medical Services you may have received from other than [facility] providers in the past year (date may be approximate).     Assessment:   This is a routine wellness examination for Vickie Pearson.  Hearing/Vision screen Hearing Screening - Comments:: Denies hearing difficulties   Vision Screening - Comments:: Wears rx glasses - up to date with routine eye exams with Dr. Allena Katz    Dietary issues and exercise activities discussed:     Goals Addressed             This Visit's Progress    Remain active and independent         Depression Screen    08/25/2022    5:52 PM 10/10/2021   10:05 AM 05/27/2021   10:18 AM 05/06/2021   10:09 AM 04/15/2021   10:19 AM 03/18/2021    2:00 PM 02/25/2021   10:45 AM  PHQ 2/9 Scores  PHQ - 2 Score 2 2 0 0 0 0 0  PHQ- 9 Score 5 5 2 2 2 1 1     Fall Risk    08/25/2022    5:53 PM 08/22/2022    9:06 AM 10/10/2021   10:04 AM 10/11/2020    1:54 PM 10/11/2020    1:51 PM  Fall Risk   Falls in the past year? 0 0 0 0 0  Number falls in past yr: 0 0 0 0 0  Injury with Fall? 0 0 0  0  Risk for fall due to : No Fall Risks   No Fall Risks   Follow up Falls prevention discussed;Education provided;Falls evaluation completed        MEDICARE RISK AT HOME:  Medicare Risk at Home - 08/25/22 1753     Any stairs in or around the home? No    If so, are there any without handrails? No    Home free of loose throw rugs in walkways, pet beds, electrical cords, etc? Yes    Adequate lighting in your home to reduce risk of falls? Yes    Life alert? No    Use of a cane, walker or w/c? No    Grab bars in the bathroom? No    Shower chair or bench in shower? Yes    Elevated toilet seat or a handicapped toilet? No             TIMED UP AND GO:  Was the test performed?  No    Cognitive Function:        08/25/2022    5:54 PM  6CIT Screen  What Year? 0 points  What  month? 0 points  What time? 0 points  Count back from 20 0 points  Months in reverse 0 points  Repeat phrase 0 points  Total Score 0 points    Immunizations Immunization History  Administered Date(s) Administered   Fluad Quad(high Dose 65+) 02/21/2020   Influenza, High Dose Seasonal PF 11/10/2018   Influenza, Quadrivalent, Recombinant, Inj, Pf 11/15/2017   Influenza,inj,Quad PF,6+ Mos 11/09/2016, 11/10/2018   PFIZER(Purple Top)SARS-COV-2 Vaccination 03/17/2019, 04/07/2019, 11/17/2019   PNEUMOCOCCAL CONJUGATE-20 10/20/2021    TDAP status: Due, Education has been provided regarding the importance of this vaccine. Advised may  receive this vaccine at local pharmacy or Health Dept. Aware to provide a copy of the vaccination record if obtained from local pharmacy or Health Dept. Verbalized acceptance and understanding.  Pneumococcal vaccine status: Up to date  Covid-19 vaccine status: Information provided on how to obtain vaccines.   Qualifies for Shingles Vaccine? Yes   Zostavax completed No   Shingrix Completed?: No.    Education has been provided regarding the importance of this vaccine. Patient has been advised to call insurance company to determine out of pocket expense if they have not yet received this vaccine. Advised may also receive vaccine at local pharmacy or Health Dept. Verbalized acceptance and understanding.  Screening Tests Health Maintenance  Topic Date Due   DTaP/Tdap/Td (1 - Tdap) Never done   Zoster Vaccines- Shingrix (1 of 2) Never done   COVID-19 Vaccine (4 - 2023-24 season) 10/10/2021   INFLUENZA VACCINE  09/10/2022   Medicare Annual Wellness (AWV)  08/25/2023   MAMMOGRAM  12/18/2023   Colonoscopy  09/18/2024   Pneumonia Vaccine 41+ Years old  Completed   DEXA SCAN  Completed   Hepatitis C Screening  Completed   HPV VACCINES  Aged Out    Health Maintenance  Health Maintenance Due  Topic Date Due   DTaP/Tdap/Td (1 - Tdap) Never done   Zoster Vaccines- Shingrix (1 of 2) Never done   COVID-19 Vaccine (4 - 2023-24 season) 10/10/2021    Colorectal cancer screening: Type of screening: Colonoscopy. Completed 09/19/19. Repeat every 5 years  Mammogram status: Completed 12/17/21. Repeat every year  Bone Density status: Completed 05/14/22. Results reflect: Bone density results: OSTEOPOROSIS. Repeat every 2 years.  Lung Cancer Screening: (Low Dose CT Chest recommended if Age 35-80 years, 20 pack-year currently smoking OR have quit w/in 15years.) does not qualify.   Lung Cancer Screening Referral: n/a  Additional Screening:  Hepatitis C Screening: does qualify; Completed  10/02/16  Vision Screening: Recommended annual ophthalmology exams for early detection of glaucoma and other disorders of the eye. Is the patient up to date with their annual eye exam?  Yes  Who is the provider or what is the name of the office in which the patient attends annual eye exams? Dr. Allena Katz  If pt is not established with a provider, would they like to be referred to a provider to establish care? No .   Dental Screening: Recommended annual dental exams for proper oral hygiene  Community Resource Referral / Chronic Care Management: CRR required this visit?  No   CCM required this visit?  No     Plan:     I have personally reviewed and noted the following in the patient's chart:   Medical and social history Use of alcohol, tobacco or illicit drugs  Current medications and supplements including opioid prescriptions. Patient is not currently taking opioid prescriptions. Functional ability and status Nutritional status Physical activity Advanced  directives List of other physicians Hospitalizations, surgeries, and ER visits in previous 12 months Vitals Screenings to include cognitive, depression, and falls Referrals and appointments  In addition, I have reviewed and discussed with patient certain preventive protocols, quality metrics, and best practice recommendations. A written personalized care plan for preventive services as well as general preventive health recommendations were provided to patient.     Kandis Fantasia [place], [place]   [id number]   After Visit Summary: (MyChart) Due to this being a telephonic visit, the after visit summary with patients personalized plan was offered to patient via MyChart   Nurse Notes: No concerns

## 2022-08-25 NOTE — Patient Instructions (Addendum)
Vickie Pearson , Thank you for taking time to come for your Medicare Wellness Visit. I appreciate your ongoing commitment to your health goals. Please review the following plan we discussed and let me know if I can assist you in the future.   These are the goals we discussed:  Goals      Remain active and independent        This is a list of the screening recommended for you and due dates:  Health Maintenance  Topic Date Due   DTaP/Tdap/Td vaccine (1 - Tdap) Never done   Zoster (Shingles) Vaccine (1 of 2) Never done   COVID-19 Vaccine (4 - 2023-24 season) 10/10/2021   Flu Shot  09/10/2022   Medicare Annual Wellness Visit  08/25/2023   Mammogram  12/18/2023   Colon Cancer Screening  09/18/2024   Pneumonia Vaccine  Completed   DEXA scan (bone density measurement)  Completed   Hepatitis C Screening  Completed   HPV Vaccine  Aged Out    Advanced directives: Information on Advanced Care Planning can be found at [facility] Advance Health Care Directives Advance Health Care Directives ([email or web address] Please bring a copy of your health care power of attorney and living will to the office to be added to your chart at your convenience.  Conditions/risks identified: Aim for 30 minutes of exercise or brisk walking, 6-8 glasses of water, and 5 servings of fruits and vegetables each day.  Next appointment: Follow up in one year for your annual wellness visit   You have an order for:  []   2D Mammogram  [x]   3D Mammogram  []   Bone Density     Please call for appointment:   [facility] [address] [place], [place] [address] [phone]  Make sure to wear two-piece clothing.  No lotions, powders, or deodorants the day of the appointment. Make sure to bring picture ID and insurance card.  Bring list of medications you are currently taking including any supplements.   Schedule your [place] screening mammogram through MyChart!   Log  into your MyChart account.  Go to 'Visit' (or 'Appointments' if on mobile App) --> Schedule an Appointment  Under 'Select a Reason for Visit' choose the Mammogram Screening option.  Complete the pre-visit questions and select the time and place that best fits your schedule.     Preventive Care 44 Years and Older, Female Preventive care refers to lifestyle choices and visits with your health care provider that can promote health and wellness. What does preventive care include? A yearly physical exam. This is also called an annual well check. Dental exams once or twice a year. Routine eye exams. Ask your health care provider how often you should have your eyes checked. Personal lifestyle choices, including: Daily care of your teeth and gums. Regular physical activity. Eating a healthy diet. Avoiding tobacco and drug use. Limiting alcohol use. Practicing safe sex. Taking low-dose aspirin every day. Taking vitamin and mineral supplements as recommended by your health care provider. What happens during an annual well check? The services and screenings done by your health care provider during your annual well check will depend on your age, overall health, lifestyle risk factors, and family history of disease. Counseling  Your health care provider may ask you questions about your: Alcohol use. Tobacco use. Drug use. Emotional well-being. Home and relationship well-being. Sexual activity. Eating habits. History of falls. Memory and ability to understand (cognition). Work and  work environment. Reproductive health. Screening  You may have the following tests or measurements: Height, weight, and BMI. Blood pressure. Lipid and cholesterol levels. These may be checked every 5 years, or more frequently if you are over 37 years old. Skin check. Lung cancer screening. You may have this screening every year starting at age 59 if you have a 30-pack-year history of smoking and currently  smoke or have quit within the past 15 years. Fecal occult blood test (FOBT) of the stool. You may have this test every year starting at age 14. Flexible sigmoidoscopy or colonoscopy. You may have a sigmoidoscopy every 5 years or a colonoscopy every 10 years starting at age 33. Hepatitis C blood test. Hepatitis B blood test. Sexually transmitted disease (STD) testing. Diabetes screening. This is done by checking your blood sugar (glucose) after you have not eaten for a while (fasting). You may have this done every 1-3 years. Bone density scan. This is done to screen for osteoporosis. You may have this done starting at age 10. Mammogram. This may be done every 1-2 years. Talk to your health care provider about how often you should have regular mammograms. Talk with your health care provider about your test results, treatment options, and if necessary, the need for more tests. Vaccines  Your health care provider may recommend certain vaccines, such as: Influenza vaccine. This is recommended every year. Tetanus, diphtheria, and acellular pertussis (Tdap, Td) vaccine. You may need a Td booster every 10 years. Zoster vaccine. You may need this after age 5. Pneumococcal 13-valent conjugate (PCV13) vaccine. One dose is recommended after age 78. Pneumococcal polysaccharide (PPSV23) vaccine. One dose is recommended after age 51. Talk to your health care provider about which screenings and vaccines you need and how often you need them. This information is not intended to replace advice given to you by your health care provider. Make sure you discuss any questions you have with your health care provider. Document Released: 02/22/2015 Document Revised: 10/16/2015 Document Reviewed: 11/27/2014 Elsevier Interactive Patient Education  2017 [employer].  Fall Prevention in the Home Falls can cause injuries. They can happen to people of all ages. There are many things you can do to make your home safe and to  help prevent falls. What can I do on the outside of my home? Regularly fix the edges of walkways and driveways and fix any cracks. Remove anything that might make you trip as you walk through a door, such as a raised step or threshold. Trim any bushes or trees on the path to your home. Use bright outdoor lighting. Clear any walking paths of anything that might make someone trip, such as rocks or tools. Regularly check to see if handrails are loose or broken. Make sure that both sides of any steps have handrails. Any raised decks and porches should have guardrails on the edges. Have any leaves, snow, or ice cleared regularly. Use sand or salt on walking paths during winter. Clean up any spills in your garage right away. This includes oil or grease spills. What can I do in the bathroom? Use night lights. Install grab bars by the toilet and in the tub and shower. Do not use towel bars as grab bars. Use non-skid mats or decals in the tub or shower. If you need to sit down in the shower, use a plastic, non-slip stool. Keep the floor dry. Clean up any water that spills on the floor as soon as it happens. Remove soap buildup  in the tub or shower regularly. Attach bath mats securely with double-sided non-slip rug tape. Do not have throw rugs and other things on the floor that can make you trip. What can I do in the bedroom? Use night lights. Make sure that you have a light by your bed that is easy to reach. Do not use any sheets or blankets that are too big for your bed. They should not hang down onto the floor. Have a firm chair that has side arms. You can use this for support while you get dressed. Do not have throw rugs and other things on the floor that can make you trip. What can I do in the kitchen? Clean up any spills right away. Avoid walking on wet floors. Keep items that you use a lot in easy-to-reach places. If you need to reach something above you, use a strong step stool that has  a grab bar. Keep electrical cords out of the way. Do not use floor polish or wax that makes floors slippery. If you must use wax, use non-skid floor wax. Do not have throw rugs and other things on the floor that can make you trip. What can I do with my stairs? Do not leave any items on the stairs. Make sure that there are handrails on both sides of the stairs and use them. Fix handrails that are broken or loose. Make sure that handrails are as long as the stairways. Check any carpeting to make sure that it is firmly attached to the stairs. Fix any carpet that is loose or worn. Avoid having throw rugs at the top or bottom of the stairs. If you do have throw rugs, attach them to the floor with carpet tape. Make sure that you have a light switch at the top of the stairs and the bottom of the stairs. If you do not have them, ask someone to add them for you. What else can I do to help prevent falls? Wear shoes that: Do not have high heels. Have rubber bottoms. Are comfortable and fit you well. Are closed at the toe. Do not wear sandals. If you use a stepladder: Make sure that it is fully opened. Do not climb a closed stepladder. Make sure that both sides of the stepladder are locked into place. Ask someone to hold it for you, if possible. Clearly mark and make sure that you can see: Any grab bars or handrails. First and last steps. Where the edge of each step is. Use tools that help you move around (mobility aids) if they are needed. These include: Canes. Walkers. Scooters. Crutches. Turn on the lights when you go into a dark area. Replace any light bulbs as soon as they burn out. Set up your furniture so you have a clear path. Avoid moving your furniture around. If any of your floors are uneven, fix them. If there are any pets around you, be aware of where they are. Review your medicines with your doctor. Some medicines can make you feel dizzy. This can increase your chance of  falling. Ask your doctor what other things that you can do to help prevent falls. This information is not intended to replace advice given to you by your health care provider. Make sure you discuss any questions you have with your health care provider. Document Released: 11/22/2008 Document Revised: 07/04/2015 Document Reviewed: 03/02/2014 Elsevier Interactive Patient Education  2017 [employer].

## 2022-09-01 NOTE — Progress Notes (Signed)
Office Visit Note  Patient: Vickie Pearson             Date of Birth: [birth year]           MRN: [id number]             PCP: Marcine Matar, MD Referring: Marcine Matar, MD Visit Date: 09/15/2022 Occupation: @GUAROCC @  Subjective:  Pain in both knees  History of Present Illness: Vickie Pearson is a 67 y.o. female with seropositive rheumatoid arthritis, osteoarthritis, degenerative disc disease and osteoporosis.  She returns today after her last visit in February 2024.  She states she had good response to cortisone injections to bilateral knee joints.  Recently her knee joints started hurting again and she would like to have cortisone injections.  As she has been experiencing some discomfort in her right ankle.  None of the other joints have been painful.  She gets Reclast from her endocrinologist for osteoporosis.    Activities of Daily Living:  Patient reports morning stiffness for a few minutes.   Patient Reports nocturnal pain.  Difficulty dressing/grooming: Denies Difficulty climbing stairs: Reports Difficulty getting out of chair: Denies Difficulty using hands for taps, buttons, cutlery, and/or writing: Denies  Review of Systems  Constitutional:  Positive for fatigue.  HENT:  Positive for mouth dryness. Negative for mouth sores.   Eyes:  Positive for dryness.  Respiratory:  Positive for cough. Negative for shortness of breath and wheezing.   Cardiovascular:  Negative for chest pain and palpitations.  Gastrointestinal:  Negative for blood in stool, constipation and diarrhea.  Endocrine: Positive for increased urination.  Genitourinary:  Negative for painful urination and involuntary urination.  Musculoskeletal:  Positive for joint pain, joint pain, myalgias, morning stiffness, muscle tenderness and myalgias. Negative for gait problem, joint swelling and muscle weakness.  Skin:  Negative for color change, rash, hair loss and sensitivity to sunlight.   Allergic/Immunologic: Negative for susceptible to infections.  Neurological:  Positive for dizziness and headaches.  Hematological:  Negative for swollen glands.  Psychiatric/Behavioral:  Positive for depressed mood and sleep disturbance. The patient is not nervous/anxious.     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   Hx of abnormal cervical Pap smear 07/24/2016   OA (osteoarthritis) of knee 07/24/2016    Past Medical History:  Diagnosis Date   Anxiety 08/27/20   Colon polyps    Coronary artery calcification of native artery 02/21/2019   Minimal calcification noted 11/2018 on coronary CTA   Costochondritis    DDD (degenerative disc disease), cervical    Degenerative arthritis of knee    Bilateral   Degenerative disc disease, cervical    Degenerative disc disease, cervical    Depression 08/27/20   Fibromyalgia    GERD (gastroesophageal reflux disease)    Hyperlipidemia    Inappropriate sinus tachycardia 02/21/2019   Monoclonal gammopathy    Multiple thyroid nodules    Osteoporosis    Rheumatoid arthritis (HCC)  Family History  Problem Relation Age of Onset   Hypertension Mother    Colon polyps Mother    Stroke Mother    Arthritis Mother    Hypertension Father    Heart disease Father    Arthritis Father    Kidney disease Father    Hypertension Sister    Hyperlipidemia Sister    Hypertension Brother     Hyperlipidemia Brother    Esophageal cancer Neg Hx    Rectal cancer Neg Hx    Stomach cancer Neg Hx    Osteoporosis Neg Hx    Past Surgical History:  Procedure Laterality Date   BREAST BIOPSY Right 2022   COLONOSCOPY  2012   COLONOSCOPY  2015   POLYPECTOMY     Social History   Social History Narrative   Not on file   Immunization History  Administered Date(s) Administered   Fluad Quad(high Dose 65+) 02/21/2020   Influenza, High Dose Seasonal PF 11/10/2018   Influenza, Quadrivalent, Recombinant, Inj, Pf 11/15/2017   Influenza,inj,Quad PF,6+ Mos 11/09/2016, 11/10/2018   PFIZER(Purple Top)SARS-COV-2 Vaccination 03/17/2019, 04/07/2019, 11/17/2019   PNEUMOCOCCAL CONJUGATE-20 10/20/2021     Objective: Vital Signs: BP 127/81 (BP Location: Left Arm, Patient Position: Sitting, Cuff Size: Normal)   Pulse 62   Resp 13   Ht 5\' 6"  (1.676 m)   Wt 155 lb 9.6 oz (70.6 kg)   BMI 25.11 kg/m    Physical Exam Vitals and nursing note reviewed.  Constitutional:      Appearance: She is well-developed.  HENT:     Head: Normocephalic and atraumatic.  Eyes:     Conjunctiva/sclera: Conjunctivae normal.  Cardiovascular:     Rate and Rhythm: Normal rate and regular rhythm.     Heart sounds: Normal heart sounds.  Pulmonary:     Effort: Pulmonary effort is normal.     Breath sounds: Normal breath sounds.  Abdominal:     General: Bowel sounds are normal.     Palpations: Abdomen is soft.  Musculoskeletal:     Cervical back: Normal range of motion.  Lymphadenopathy:     Cervical: No cervical adenopathy.  Skin:    General: Skin is warm and dry.     Capillary Refill: Capillary refill takes less than 2 seconds.  Neurological:     Mental Status: She is alert and oriented to person, place, and time.  Psychiatric:        Behavior: Behavior normal.      Musculoskeletal Exam: She had good range of motion of cervical, thoracic and lumbar spine.  Shoulders, and elbows were in good range of  motion.  She had limited extension of her wrist joints.  She had mild tenosynovitis over bilateral wrist.  No synovitis was noted over MCPs PIPs and DIPs.  PIP and DIP thickening was noted.  Hip joints and wrist limited range of motion.  She had limited extension of bilateral knee joints without any warmth swelling or effusion.  There was no tenderness over ankles or MTPs.  CDAI Exam: CDAI Score: 10  Patient Global: 20 / 100; Provider Global: 20 / 100 Swollen: 2 ; Tender: 4  Joint Exam 09/15/2022      Right  Left  Wrist  Swollen Tender  Swollen Tender  Knee   Tender   Tender     Investigation: No additional findings.  Imaging: No results found.  Recent Labs: Lab Results  Component Value Date   WBC 5.5 02/20/2022   HGB 13.0 02/20/2022  PLT 256 02/20/2022   NA 140 02/20/2022   K 3.6 02/20/2022   CL 108 02/20/2022   CO2 28 02/20/2022   GLUCOSE 74 02/20/2022   BUN 10 02/20/2022   CREATININE 0.77 02/20/2022   BILITOT 0.3 02/20/2022   ALKPHOS 61 02/20/2022   AST 17 02/20/2022   ALT 9 02/20/2022   PROT 7.2 02/20/2022   ALBUMIN 3.9 02/20/2022   CALCIUM 9.7 02/20/2022   GFRAA 78 07/02/2020    Speciality Comments: No specialty comments available.  Procedures:  Large Joint Inj: bilateral knee on 09/15/2022 11:39 AM Indications: pain Details: 27 G 1.5 in needle, medial approach  Arthrogram: No  Medications (Right): 1.5 mL lidocaine 1 %; 40 mg triamcinolone acetonide 40 MG/ML Aspirate (Right): 0 mL Medications (Left): 1.5 mL lidocaine 1 %; 40 mg triamcinolone acetonide 40 MG/ML Aspirate (Left): 0 mL Outcome: tolerated well, no immediate complications Procedure, treatment alternatives, risks and benefits explained, specific risks discussed. Consent was given by the patient. Immediately prior to procedure a time out was called to verify the correct patient, procedure, equipment, support staff and site/side marked as required. Patient was prepped and draped in the usual  sterile fashion.     Allergies: Penicillins, Piroxicam, Statins, Sulfa antibiotics, and Zetia [ezetimibe]   Assessment / Plan:     Visit Diagnoses: Rheumatoid arthritis of multiple sites with negative rheumatoid factor (HCC) - she has history of severe erosive rheumatoid arthritis.  She had mild extensor tenosynovitis of her bilateral wrist joints.  No synovitis was noted on the examination today.  We had discussion several times in the past and even today.  She does not want immunosuppressive agents.    Chronic iritis of both eyes-patient denies any recent episodes of recurrence.  Chronic pain of both knees-she continues to have pain and discomfort in her bilateral knee joints.  She has limited extension due to severe osteoarthritis.  She requested cortisone injections to bilateral knee joints.  She is not ready for total knee replacement.  Last cortisone injections given in February 2024 lasted until recently.  After informed consent was obtained bilateral knee joints were prepped in sterile fashion and injected with lidocaine and cortisone as described above.  Patient tolerated the procedure well.  Postprocedure instructions were given.  Primary osteoarthritis of both knees - Severe end-stage rheumatoid arthritis of both knees. s/p bilateral knee cortisone injections on 03/17/2022.  DDD (degenerative disc disease), cervical-she had good range of motion of the cervical spine without any discomfort.  DDD (degenerative disc disease), lumbar-she had good mobility with minimal discomfort.  Age-related osteoporosis without current pathological fracture - DEXA updated on May 14, 2022 T-score -3.1, BMD 0.610 left femoral neck.Reclast 2017, 2022 by Dr. Everardo All.  Other medical problems are listed as follows:  Vitamin D deficiency  Mixed hyperlipidemia  Multiple thyroid nodules  Monoclonal gammopathy - followed by oncology.  Orders: Orders Placed This Encounter  Procedures   Large Joint Inj    No orders of the defined types were placed in this encounter.   Follow-Up Instructions: Return in about 6 months (around 03/18/2023) for Rheumatoid arthritis, Osteoarthritis.   Pollyann Savoy, MD  Note - This record has been created using [employer].  Chart creation errors have been sought, but may not always  have been located. Such creation errors do not reflect on  the standard of medical care.

## 2022-09-15 ENCOUNTER — Encounter: Payer: Self-pay | Admitting: Rheumatology

## 2022-09-15 ENCOUNTER — Ambulatory Visit: Payer: Medicare Other | Attending: Rheumatology | Admitting: Rheumatology

## 2022-09-15 VITALS — BP 127/81 | HR 62 | Resp 13 | Ht 66.0 in | Wt 155.6 lb

## 2022-09-15 DIAGNOSIS — M5136 Other intervertebral disc degeneration, lumbar region: Secondary | ICD-10-CM | POA: Diagnosis present

## 2022-09-15 DIAGNOSIS — E782 Mixed hyperlipidemia: Secondary | ICD-10-CM | POA: Insufficient documentation

## 2022-09-15 DIAGNOSIS — G8929 Other chronic pain: Secondary | ICD-10-CM | POA: Insufficient documentation

## 2022-09-15 DIAGNOSIS — M810 Age-related osteoporosis without current pathological fracture: Secondary | ICD-10-CM | POA: Insufficient documentation

## 2022-09-15 DIAGNOSIS — M0609 Rheumatoid arthritis without rheumatoid factor, multiple sites: Secondary | ICD-10-CM | POA: Insufficient documentation

## 2022-09-15 DIAGNOSIS — E042 Nontoxic multinodular goiter: Secondary | ICD-10-CM | POA: Insufficient documentation

## 2022-09-15 DIAGNOSIS — D472 Monoclonal gammopathy: Secondary | ICD-10-CM | POA: Diagnosis present

## 2022-09-15 DIAGNOSIS — M5030 Other cervical disc degeneration, unspecified cervical region: Secondary | ICD-10-CM | POA: Insufficient documentation

## 2022-09-15 DIAGNOSIS — H2013 Chronic iridocyclitis, bilateral: Secondary | ICD-10-CM | POA: Diagnosis not present

## 2022-09-15 DIAGNOSIS — M25561 Pain in right knee: Secondary | ICD-10-CM | POA: Diagnosis not present

## 2022-09-15 DIAGNOSIS — M25562 Pain in left knee: Secondary | ICD-10-CM | POA: Insufficient documentation

## 2022-09-15 DIAGNOSIS — E559 Vitamin D deficiency, unspecified: Secondary | ICD-10-CM | POA: Diagnosis present

## 2022-09-15 DIAGNOSIS — M170 Bilateral primary osteoarthritis of knee: Secondary | ICD-10-CM | POA: Insufficient documentation

## 2022-09-15 MED ORDER — TRIAMCINOLONE ACETONIDE 40 MG/ML IJ SUSP
40.0000 mg | INTRAMUSCULAR | Status: AC | PRN
Start: 2022-09-15 — End: 2022-09-15
  Administered 2022-09-15: 40 mg via INTRA_ARTICULAR

## 2022-09-15 MED ORDER — LIDOCAINE HCL 1 % IJ SOLN
1.5000 mL | INTRAMUSCULAR | Status: AC | PRN
Start: 2022-09-15 — End: 2022-09-15
  Administered 2022-09-15: 1.5 mL

## 2022-09-17 ENCOUNTER — Other Ambulatory Visit: Payer: Self-pay | Admitting: Cardiovascular Disease

## 2022-09-17 NOTE — Telephone Encounter (Signed)
Rx request sent to pharmacy.  

## 2022-10-07 ENCOUNTER — Other Ambulatory Visit: Payer: Self-pay | Admitting: Pharmacist Clinician (PhC)/ Clinical Pharmacy Specialist

## 2022-10-07 MED ORDER — REPATHA SURECLICK 140 MG/ML ~~LOC~~ SOAJ: 140.0000 mg | SUBCUTANEOUS | 3 refills | Status: DC

## 2022-10-07 NOTE — Telephone Encounter (Signed)
[employer] no longer made, switch to [employer].  Explained use and that she can read directions in box or find video on line of how to use.  Patient voiced understanding

## 2022-10-29 ENCOUNTER — Ambulatory Visit: Payer: Medicare Other | Attending: Physician Assistant | Admitting: Physician Assistant

## 2022-10-29 ENCOUNTER — Encounter: Payer: Self-pay | Admitting: Physician Assistant

## 2022-10-29 ENCOUNTER — Ambulatory Visit
Admission: RE | Admit: 2022-10-29 | Discharge: 2022-10-29 | Disposition: A | Discharge status: Home / Self Care | Payer: Medicare Other | Source: Ambulatory Visit | Attending: Physician Assistant | Admitting: Physician Assistant

## 2022-10-29 VITALS — BP 149/83 | HR 58 | Wt 154.2 lb

## 2022-10-29 DIAGNOSIS — Z79899 Other long term (current) drug therapy: Secondary | ICD-10-CM | POA: Diagnosis not present

## 2022-10-29 DIAGNOSIS — K219 Gastro-esophageal reflux disease without esophagitis: Secondary | ICD-10-CM | POA: Diagnosis not present

## 2022-10-29 DIAGNOSIS — R053 Chronic cough: Secondary | ICD-10-CM | POA: Insufficient documentation

## 2022-10-29 DIAGNOSIS — R351 Nocturia: Secondary | ICD-10-CM | POA: Insufficient documentation

## 2022-10-29 LAB — POCT URINALYSIS DIP (CLINITEK)
Bilirubin, UA: NEGATIVE
Blood, UA: NEGATIVE
Glucose, UA: NEGATIVE mg/dL
Ketones, POC UA: NEGATIVE mg/dL
Leukocytes, UA: NEGATIVE
Nitrite, UA: NEGATIVE
POC PROTEIN,UA: NEGATIVE
Spec Grav, UA: 1.015 (ref 1.010–1.025)
Urobilinogen, UA: 0.2 E.U./dL
pH, UA: 7 (ref 5.0–8.0)

## 2022-10-29 MED ORDER — OMEPRAZOLE 20 MG PO CPDR: 20.0000 mg | DELAYED_RELEASE_CAPSULE | Freq: Every day | ORAL | 3 refills | Status: AC

## 2022-10-29 NOTE — Patient Instructions (Addendum)
[facility] for chest Xray.  They are open until 7pm  Address: [address] [place], [place], [place] [address] Phone: [phone]  Overactive Bladder, Adult  Overactive bladder is a condition in which a person has a sudden and frequent need to urinate. A person might also leak urine if he or she cannot get to the bathroom fast enough (urinary incontinence). Sometimes, symptoms can interfere with work or social activities. What are the causes? Overactive bladder is associated with poor nerve signals between your bladder and your brain. Your bladder may get the signal to empty before it is full. You may also have very sensitive muscles that make your bladder squeeze too soon. This condition may also be caused by other factors, such as: Medical conditions: Urinary tract infection. Infection of nearby tissues. Prostate enlargement. Bladder stones, inflammation, or tumors. Diabetes. Muscle or nerve weakness, especially from these conditions: A spinal cord injury. Stroke. Multiple sclerosis. Parkinson's disease. Other causes: Surgery on the uterus or urethra. Drinking too much caffeine or alcohol. Certain medicines, especially those that eliminate extra fluid in the body (diuretics). Constipation. What increases the risk? You may be at greater risk for overactive bladder if you: Are an older adult. Smoke. Are going through menopause. Have prostate problems. Have a neurological disease, such as stroke, dementia, Parkinson's disease, or multiple sclerosis (MS). Eat or drink alcohol, spicy food, caffeine, and other things that irritate the bladder. Are overweight or obese. What are the signs or symptoms? Symptoms of this condition include a sudden, strong urge to urinate. Other symptoms include: Leaking urine. Urinating 8 or more times a day. Waking up to urinate 2 or more times overnight. How is this diagnosed? This condition may be diagnosed based on: Your symptoms and medical  history. A physical exam. Blood or urine tests to check for possible causes, such as infection. You may also need to see a health care provider who specializes in urinary tract problems. This is called a urologist. How is this treated? Treatment for overactive bladder depends on the cause of your condition and whether it is mild or severe. Treatment may include: Bladder training, such as: Learning to control the urge to urinate by following a schedule to urinate at regular intervals. Doing Kegel exercises to strengthen the pelvic floor muscles that support your bladder. Special devices, such as: Biofeedback. This uses sensors to help you become aware of your body's signals. Electrical stimulation. This uses electrodes placed inside the body (implanted) or outside the body. These electrodes send gentle pulses of electricity to strengthen the nerves or muscles that control the bladder. Women may use a plastic device, called a pessary, that fits into the vagina and supports the bladder. Medicines, such as: Antibiotics to treat bladder infection. Antispasmodics to stop the bladder from releasing urine at the wrong time. Tricyclic antidepressants to relax bladder muscles. Injections of botulinum toxin type A directly into the bladder tissue to relax bladder muscles. Surgery, such as: A device may be implanted to help manage the nerve signals that control urination. An electrode may be implanted to stimulate electrical signals in the bladder. A procedure may be done to change the shape of the bladder. This is done only in very severe cases. Follow these instructions at home: Eating and drinking  Make diet or lifestyle changes recommended by your health care provider. These may include: Drinking fluids throughout the day and not only with meals. Cutting down on caffeine or alcohol. Eating a healthy and balanced diet to prevent  constipation. This may include: Choosing foods that are high in  fiber, such as beans, whole grains, and fresh fruits and vegetables. Limiting foods that are high in fat and processed sugars, such as fried and sweet foods. Lifestyle  Lose weight if needed. Do not use any products that contain nicotine or tobacco. These include cigarettes, chewing tobacco, and vaping devices, such as e-cigarettes. If you need help quitting, ask your health care provider. General instructions Take over-the-counter and prescription medicines only as told by your health care provider. If you were prescribed an antibiotic medicine, take it as told by your health care provider. Do not stop taking the antibiotic even if you start to feel better. Use any implants or pessary as told by your health care provider. If needed, wear pads to absorb urine leakage. Keep a log to track how much and when you drink, and when you need to urinate. This will help your health care provider monitor your condition. Keep all follow-up visits. This is important. Contact a health care provider if: You have a fever or chills. Your symptoms do not get better with treatment. Your pain and discomfort get worse. You have more frequent urges to urinate. Get help right away if: You are not able to control your bladder. Summary Overactive bladder refers to a condition in which a person has a sudden and frequent need to urinate. Several conditions may lead to an overactive bladder. Treatment for overactive bladder depends on the cause and severity of your condition. Making lifestyle changes, doing Kegel exercises, keeping a log, and taking medicines can help with this condition. This information is not intended to replace advice given to you by your health care provider. Make sure you discuss any questions you have with your health care provider. Document Revised: 10/16/2019 Document Reviewed: 10/16/2019 Elsevier Patient Education  2024 [employer] Inc.   Chronic Cough Coughing is a reflex that clears  your throat and airways (respiratory system). It helps heal and protect your lungs. It is normal to cough from time to time. A cough that happens with other symptoms or that lasts a long time may be a sign of a condition that needs treatment. A long-term (chronic) cough may last 8 or more weeks. There are two types of chronic cough: A symptomatic chronic cough. This is caused by a disease that can be found and treated. A refractory chronic cough. This is a cough that does not go away with testing and treatment. A chronic cough may be caused by: Long-term lung diseases. These include chronic obstructive pulmonary disease (COPD), asthma, and pulmonary fibrosis. Upper airway problems. These include allergies, sinusitis, and gastric reflux. Some medicines. Smoking. Follow these instructions at home: Medicines Take over-the-counter and prescription medicines only as told by your health care provider. Ask your provider about getting a flu (influenza) or pneumonia vaccine. Managing a sore or dry throat If your throat is sore or dry, gargle with a mixture of salt and water 3-4 times a day or as needed. To make salt water, completely dissolve -1 tsp (3-6 g) of salt in 1 cup (237 mL) of warm water. Soothe your throat with a cough drop or honey. A dry throat may make your cough worse. Use a cool mist vaporizer at home to add moisture to the air. Lifestyle Avoid cigarette smoke. Do not use any products that contain nicotine or tobacco. These products include cigarettes, chewing tobacco, and vaping devices, such as e-cigarettes. If you need help quitting, ask your provider.  Avoid things that may irritate your throat or trigger your allergies. General instructions  Drink enough fluid to keep your pee (urine) pale yellow. Always cover your mouth when you cough. Stay away from people who are sick. Getting a cold or the flu can make your cough worse. Wash your hands often with soap and water for at least 20  seconds. If soap and water are not available, use hand sanitizer. Contact a health care provider if: Your cough gets worse. You have a fever or chills. You are short of breath. Get help right away if: You have trouble breathing. You have chest pain. These symptoms may be an emergency. Get help right away. Call 911. Do not wait to see if the symptoms will go away. Do not drive yourself to the hospital. This information is not intended to replace advice given to you by your health care provider. Make sure you discuss any questions you have with your health care provider. Document Revised: 02/19/2022 Document Reviewed: 10/09/2021 Elsevier Patient Education  2024 [employer].

## 2022-10-29 NOTE — Progress Notes (Signed)
Patient ID: Vickie Pearson, female   DOB: May 07, 1955, 67 y.o.   MRN: [id number]   Vickie Pearson, is a 67 y.o. female  UVO:[id number]  [id number]  DOB - [birth year]  Chief Complaint  Patient presents with   Cough   Shoulder Pain       Subjective:   Vickie Pearson is a 67 y.o. female here today for chronic cough for at least several months.  She denies weight loss, night sweats, hemoptysis.  She has never been a smoker.  She does admit to acid reflux and sees GI next month but she has never taken acid reducer to  Decrease cough.  Describes cough as dry and occurs day and night but not constantly.    No problems updated.  ALLERGIES: Allergies  Allergen Reactions   Penicillins Hives    Has patient had a PCN reaction causing immediate rash, facial/tongue/throat swelling, SOB or lightheadedness with hypotension: No Has patient had a PCN reaction causing severe rash involving mucus membranes or skin necrosis: Yes Has patient had a PCN reaction that required hospitalization: No Has patient had a PCN reaction occurring within the last 10 years: No If all of the above answers are "NO", then may proceed with Cephalosporin use.    Piroxicam Hives   Statins    Sulfa Antibiotics Hives   Zetia [Ezetimibe]     Muscle cramps    PAST MEDICAL HISTORY: Past Medical History:  Diagnosis Date   Anxiety 08/27/20   Colon polyps    Coronary artery calcification of native artery 02/21/2019   Minimal calcification noted 11/2018 on coronary CTA   Costochondritis    DDD (degenerative disc disease), cervical    Degenerative arthritis of knee    Bilateral   Degenerative disc disease, cervical    Degenerative disc disease, cervical    Depression 08/27/20   Fibromyalgia    GERD (gastroesophageal reflux disease)    Hyperlipidemia    Inappropriate sinus tachycardia 02/21/2019   Monoclonal gammopathy    Multiple thyroid nodules    Osteoporosis    Rheumatoid arthritis  (HCC)     MEDICATIONS AT HOME: Prior to Admission medications   Medication Sig Start Date End Date Taking? Authorizing Provider  cholecalciferol (VITAMIN D) 1000 units tablet Take 3,000 Units by mouth daily.   Yes [provider]  Evolocumab (REPATHA SURECLICK) 140 MG/ML SOAJ Inject 140 mg into the skin every 14 (fourteen) days. 10/07/22  Yes Chilton Si, MD  fluticasone [facility]) 50 MCG/ACT nasal spray Place 1 spray into both nostrils as needed for allergies or rhinitis.   Yes [provider]  hydrOXYzine (VISTARIL) 25 MG capsule Take 1 capsule (25 mg total) by mouth 2 (two) times daily as needed for anxiety. Please make PCP appt. 07/31/22  Yes Hoy Register, MD  Lactobacillus (PROBIOTIC ACIDOPHILUS PO) Take 1 capsule by mouth daily.   Yes [provider]  Magnesium Citrate 100 MG TABS Take by mouth daily as needed.   Yes [provider]  metoprolol succinate (TOPROL-XL) 25 MG 24 hr tablet Take 1 tablet (25 mg total) by mouth daily. 09/17/22  Yes Chilton Si, MD  omeprazole (PRILOSEC) 20 MG capsule Take 1 capsule (20 mg total) by mouth daily. Take at least 3 weeks to see if cough improves 10/29/22  Yes Ayub Kirsh M, PA-C  vitamin B-12 (CYANOCOBALAMIN) 1000 MCG tablet Take 1 tablet by mouth daily.   Yes [provider]  zinc sulfate 220 (50 Zn) MG capsule Take by mouth  daily.    Yes [provider]  Zoledronic Acid (RECLAST IV)  12/23/20  Yes [provider]    ROS: Neg HEENT Neg resp Neg cardiac Neg GI Neg GU Neg MS Neg psych Neg neuro  Objective:   Vitals:   10/29/22 1430  BP: (!) 149/83  Pulse: (!) 58  SpO2: 100%  Weight: 154 lb 3.2 oz (69.9 kg)   Exam General appearance : Awake, alert, not in any distress. Speech Clear. Not toxic looking HEENT: Atraumatic and Normocephalic, throat with PND.   Neck: Supple, no JVD. No cervical lymphadenopathy.  Chest: Good air entry bilaterally, CTAB.  No  rales/rhonchi/wheezing CVS: S1 S2 regular, no murmurs.  Extremities: B/L Lower Ext shows no edema, both legs are warm to touch Neurology: Awake alert, and oriented X 3, CN II-XII intact, Non focal Skin: No Rash  Data Review Lab Results  Component Value Date   HGBA1C 5.5 10/10/2021    Assessment & Plan   1. Persistent cough Will treat for at least 3 weeks with omeprazole to see if improvement.  ?sinus and postnasal drip related would be another possibility to treat if no help - DG Chest 2 View; Future  2. Nocturia UA is ok.  Limit evening liquids.  Likely menopausal - POCT URINALYSIS DIP (CLINITEK)  3. Gastroesophageal reflux disease without esophagitis See GI as planned.   - omeprazole (PRILOSEC) 20 MG capsule; Take 1 capsule (20 mg total) by mouth daily. Take at least 3 weeks to see if cough improves  Dispense: 30 capsule; Refill: 3    Return if symptoms worsen or fail to improve.  The patient was given clear instructions to go to ER or return to medical center if symptoms don't improve, worsen or new problems develop. The patient verbalized understanding. The patient was told to call to get lab results if they haven't heard anything in the next week.      Georgian Co, PA-C [facility] [place], [place] [phone]   10/29/2022, 2:58 PM

## 2022-11-02 ENCOUNTER — Other Ambulatory Visit: Payer: Self-pay | Admitting: Family Medicine

## 2022-11-02 DIAGNOSIS — F419 Anxiety disorder, unspecified: Secondary | ICD-10-CM

## 2022-11-02 MED ORDER — HYDROXYZINE PAMOATE 25 MG PO CAPS
25.0000 mg | ORAL_CAPSULE | Freq: Two times a day (BID) | ORAL | 0 refills | Status: AC | PRN
Start: 2022-11-02 — End: ?

## 2022-11-03 ENCOUNTER — Other Ambulatory Visit: Payer: Self-pay | Admitting: Internal Medicine

## 2022-11-03 DIAGNOSIS — Z1231 Encounter for screening mammogram for malignant neoplasm of breast: Secondary | ICD-10-CM

## 2022-11-06 ENCOUNTER — Telehealth: Payer: Self-pay | Admitting: Internal Medicine

## 2022-11-06 NOTE — Telephone Encounter (Signed)
Called pt, informed her that CXR results are not back yet and that I would send to office and have them FU as soon as results come in. Also advised pt that she may see results via Mychart before she gets call from office. Pt verbalized understanding.   Pt is calling to receive results for her chest xray. Please advise CB- [phone]

## 2022-11-06 NOTE — Telephone Encounter (Signed)
The Radiologist is yet to put in a reading.

## 2022-11-06 NOTE — Telephone Encounter (Signed)
Pt is calling to receive results for her chest xray. Please advise CB- [phone]

## 2022-11-10 NOTE — Telephone Encounter (Signed)
Called [facility] and spoke to [facility] in [place], to have results released.   Patient is also aware of the delay and grateful for the call.

## 2022-11-12 ENCOUNTER — Telehealth: Payer: Self-pay

## 2022-11-12 NOTE — Telephone Encounter (Signed)
Patient viewed results and Doctor comment through [facility]

## 2022-11-12 NOTE — Telephone Encounter (Signed)
-----   Message from Georgian Co sent at 11/11/2022 11:56 AM EDT ----- Your chest Xray is normal.  Thanks, Georgian Co, PA-C

## 2022-11-24 NOTE — Telephone Encounter (Signed)
Patient viewed results via MyChart 11/11/2022 at 1157

## 2022-11-25 ENCOUNTER — Encounter: Payer: Self-pay | Admitting: Internal Medicine

## 2022-11-25 ENCOUNTER — Ambulatory Visit (INDEPENDENT_AMBULATORY_CARE_PROVIDER_SITE_OTHER): Payer: Medicare Other | Admitting: Internal Medicine

## 2022-11-25 VITALS — BP 120/76 | HR 60 | Ht 66.0 in | Wt 155.8 lb

## 2022-11-25 DIAGNOSIS — E042 Nontoxic multinodular goiter: Secondary | ICD-10-CM | POA: Diagnosis not present

## 2022-11-25 DIAGNOSIS — M810 Age-related osteoporosis without current pathological fracture: Secondary | ICD-10-CM

## 2022-11-25 LAB — BASIC METABOLIC PANEL
BUN: 12 mg/dL (ref 6–23)
CO2: 30 meq/L (ref 19–32)
Calcium: 10.1 mg/dL (ref 8.4–10.5)
Chloride: 103 meq/L (ref 96–112)
Creatinine, Ser: 0.88 mg/dL (ref 0.40–1.20)
GFR: 67.35 mL/min (ref >60.00)
Glucose, Bld: 85 mg/dL (ref 70–99)
Potassium: 3.8 meq/L (ref 3.5–5.1)
Sodium: 140 meq/L (ref 135–145)

## 2022-11-25 LAB — VITAMIN D 25 HYDROXY (VIT D DEFICIENCY, FRACTURES): VITD: 41.65 ng/mL (ref 30.00–100.00)

## 2022-11-25 MED ORDER — ACETAMINOPHEN 325 MG PO TABS: 650.0000 mg | ORAL_TABLET | Freq: Once | ORAL | Status: DC

## 2022-11-25 MED ORDER — DIPHENHYDRAMINE HCL 25 MG PO CAPS: 25.0000 mg | ORAL_CAPSULE | Freq: Once | ORAL | Status: DC

## 2022-11-25 NOTE — Progress Notes (Signed)
Patient ID: Vickie Pearson, female   DOB: 03-Nov-1955, 67 y.o.   MRN: [id number]  HPI  Vickie Pearson is a 67 y.o.-year-old female, returning for follow-up for osteoporosis (OP) and multinodular goiter.  She previously saw Dr. Everardo All, last visit 1 year ago.  Interim history: No falls or fractures since last visit.  She had dizziness but not vertigo or orthostasis. Dizziness improved significantly. No weightbearing exercise. She does dancing and balance exercises. She had a persistent cough >> was on Omeprazole, now taking the drug holiday but will restart.  OP: - dx'ed  in 2018  I reviewed pt's DXA scans: Date L1-L4 T score FN T score 33% distal Radius Ultra distal radius  [facility] N/a RFN: -2.6 LFN: -3.1 -1.1 -3.4  07/07/2019 [facility]) N/a RFN: -2.8 LFN: -3.0 -1.9 -0.8  04/29/2012 ([facility] [place]) -0.6 RFN: N/a LFN: -2.3 N/a N/a   She has no fracture history.  Previous OP treatments:  - Reclast: 2018 (1 dose), 11/13/2020, 12/22/2021 - no jaw/hip/thigh pain.  Vitamin D insufficiency:  Reviewed available vit D levels: Lab Results  Component Value Date   VD25OH 51.35 11/18/2021   VD25OH 43.47 10/02/2020   VD25OH 51.84 09/27/2019   VD25OH 45 01/25/2017   VD25OH 27 (L) 10/02/2016   VD25OH 35.7 07/24/2016   Pt is on: - vitamin D 3000 units daily.  No weight bearing exercises. She does chair yoga, line dancing.  She does not take high vitamin A doses.  She has a history RA and was on prednisone between 2015-2018.   She also had steroid injections in the knees.  She has a history of MGUS.  She smoked between 1979 and 1989.  Menopause was at 12s y/o.   No FH of osteoporosis.  No h/o hyper/hypocalcemia or hyperparathyroidism. No h/o kidney stones. Lab Results  Component Value Date   PTH 64 10/02/2020   PTH 35 09/27/2019   CALCIUM 9.7 02/20/2022   CALCIUM 9.9 11/18/2021   CALCIUM 9.3 02/20/2021   CALCIUM 9.7 10/02/2020    CALCIUM 9.7 10/02/2020   CALCIUM 10.1 09/30/2020   CALCIUM 9.9 07/02/2020   CALCIUM 9.7 02/21/2020   CALCIUM 10.3 09/27/2019   CALCIUM 10.5 09/27/2019   No h/o thyrotoxicosis. Reviewed TSH recent levels:  Lab Results  Component Value Date   TSH 0.93 10/02/2020   TSH 1.51 09/27/2019   TSH 0.935 11/24/2018   No h/o CKD. Last BUN/Cr: Lab Results  Component Value Date   BUN 10 02/20/2022   CREATININE 0.77 02/20/2022   MNG: - euthyroid - dx'ed 2019  Thyroid ultrasound (10/09/2020): Thyroid nodules were low risk and no follow-up was needed: Parenchymal Echotexture: Mildly heterogenous Isthmus: 0.3 cm Right lobe: 4.6 x 1.2 x 2.1 cm Left lobe: 5.1 x 1.6 x 1.9 cm  Estimated total number of nodules >/= 1 cm: 3   Multiple small spongiform and mixed cystic/solid nodules with isoechoic solid component and peripheral colloid artifact in the left mid and lower gland.   None of these nodules meet criteria to warrant biopsy or dedicated imaging follow-up.   No new nodules or suspicious features identified.   IMPRESSION: Sonographically benign/low risk spongiform nodules and mixed cystic and solid nodules in the left mid and lower gland. None of the lesions meets criteria to warrant biopsy or further imaging follow-up.  Pt denies: - feeling nodules in neck - hoarseness - dysphagia - choking  ROS: + see HPI  I reviewed pt's medications, allergies, PMH, social hx,  family hx, and changes were documented in the history of present illness. Otherwise, unchanged from my initial visit note.  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   Monoclonal gammopathy    Multiple thyroid nodules    Osteoporosis    Rheumatoid arthritis [facility])    Past Surgical History:  Procedure Laterality Date   BREAST BIOPSY Right 2022   COLONOSCOPY  2012   COLONOSCOPY  2015   POLYPECTOMY     Social History   Socioeconomic History   Marital status: Widowed    Spouse name: Not on file   Number of children: Not on file   Years of education: Not on file   Highest education level: Bachelor's degree (e.g., BA, AB, BS)  Occupational History   Not on file  Tobacco Use   Smoking status: Former    Current packs/day: 0.00    Types: Cigarettes    Quit date: 07/24/1988    Years since quitting: 34.3    Passive exposure: Never   Smokeless tobacco: Never  Vaping Use   Vaping status: Never Used  Substance and Sexual Activity   Alcohol use: Yes    Comment: occasionally   Drug use: No   Sexual activity: Not Currently    Birth control/protection: Abstinence  Other Topics Concern   Not on file  Social History Narrative   Not on file   Social Determinants of Health   Financial Resource Strain: Low Risk  (10/25/2022)   Overall Financial Resource Strain (CARDIA)    Difficulty of Paying Living Expenses: Not very hard  Food Insecurity: No Food Insecurity (10/25/2022)   Hunger Vital Sign    Worried About Running Out of Food in the Last Year: Never true    Ran Out of Food in the Last Year: Never true  Transportation Needs: No Transportation Needs (10/25/2022)   PRAPARE - Administrator, Civil Service (Medical): No    Lack of Transportation (Non-Medical): No  Physical Activity: Sufficiently Active (10/25/2022)   Exercise Vital Sign    Days of Exercise per Week: 4 days    Minutes of Exercise per Session: 50 min  Stress: Stress Concern Present (10/25/2022)   [employer] of Occupational Health -  Occupational Stress Questionnaire    Feeling of Stress : To some extent  Social Connections: Moderately Integrated (10/25/2022)   Social Connection and Isolation Panel [NHANES]    Frequency of Communication with Friends and Family: More than three times a week    Frequency of Social Gatherings with Friends and Family: Twice a week    Attends Religious Services: More than 4 times per year    Active Member of [employer] or Organizations: Yes    Attends Banker Meetings: More than 4 times per year    Marital Status: Widowed  Intimate Partner Violence: Not At Risk (08/25/2022)   Humiliation, Afraid, Rape, and Kick questionnaire    Fear of Current or Ex-Partner: No    Emotionally Abused:  No    Physically Abused: No    Sexually Abused: No   Current Outpatient Medications on File Prior to Visit  Medication Sig Dispense Refill   cholecalciferol (VITAMIN D) 1000 units tablet Take 3,000 Units by mouth daily.     Evolocumab (REPATHA SURECLICK) 140 MG/ML SOAJ Inject 140 mg into the skin every 14 (fourteen) days. 6 mL 3   fluticasone (FLONASE) 50 MCG/ACT nasal spray Place 1 spray into both nostrils as needed for allergies or rhinitis.     hydrOXYzine (VISTARIL) 25 MG capsule Take 1 capsule (25 mg total) by mouth 2 (two) times daily as needed for anxiety. Please make PCP appt. 30 capsule 0   Lactobacillus (PROBIOTIC ACIDOPHILUS PO) Take 1 capsule by mouth daily.     Magnesium Citrate 100 MG TABS Take by mouth daily as needed.     metoprolol succinate (TOPROL-XL) 25 MG 24 hr tablet Take 1 tablet (25 mg total) by mouth daily. 90 tablet 1   omeprazole (PRILOSEC) 20 MG capsule Take 1 capsule (20 mg total) by mouth daily. Take at least 3 weeks to see if cough improves 30 capsule 3   vitamin B-12 (CYANOCOBALAMIN) 1000 MCG tablet Take 1 tablet by mouth daily.     zinc sulfate 220 (50 Zn) MG capsule Take by mouth daily.      Zoledronic Acid (RECLAST IV)      No current facility-administered  medications on file prior to visit.   Allergies  Allergen Reactions   Penicillins Hives    Has patient had a PCN reaction causing immediate rash, facial/tongue/throat swelling, SOB or lightheadedness with hypotension: No Has patient had a PCN reaction causing severe rash involving mucus membranes or skin necrosis: Yes Has patient had a PCN reaction that required hospitalization: No Has patient had a PCN reaction occurring within the last 10 years: No If all of the above answers are "NO", then may proceed with Cephalosporin use.    Piroxicam Hives   Statins    Sulfa Antibiotics Hives   Zetia [Ezetimibe]     Muscle cramps   Family History  Problem Relation Age of Onset   Hypertension Mother    Colon polyps Mother    Stroke Mother    Arthritis Mother    Hypertension Father    Heart disease Father    Arthritis Father    Kidney disease Father    Hypertension Sister    Hyperlipidemia Sister    Hypertension Brother    Hyperlipidemia Brother    Esophageal cancer Neg Hx    Rectal cancer Neg Hx    Stomach cancer Neg Hx    Osteoporosis Neg Hx    PE: There were no vitals taken for this visit. Wt Readings from Last 3 Encounters:  10/29/22 154 lb 3.2 oz (69.9 kg)  09/15/22 155 lb 9.6 oz (70.6 kg)  08/25/22 157 lb (71.2 kg)   Constitutional: Normal weight, in NAD. No kyphosis. Eyes: EOMI, no exophthalmos ENT: no thyromegaly, no cervical lymphadenopathy Cardiovascular: RRR, No MRG Respiratory: CTA B Musculoskeletal: no deformities Skin: no rashes Neurological: no tremor with outstretched hands  Assessment: 1. Osteoporosis  2.  Vitamin D insufficiency 3. MNG  Plan: 1. Osteoporosis - Likely postmenopausal/age-related, but possibly also contributed to by her history of RA, steroid treatment, and MGUS - we discussed about increased risk for fractures in an inversely proportional manner compared to the T-scores.  Reviewed the latest bone density scan results from 05/14/2022.   This was stable at  the femoral necks, but slightly worse at the ultra distal radius (T score -3.4).  We discussed that this is a site with limited accessibility by antiosteoporotic drugs.  The best way to increase the bone density at this site is actually weightbearing exercise. -At last visit I gave her the National osteoporosis foundation recommendations about weightbearing exercises and advised her to do this every day or at least 5 out of 7 days. -We again discussed fall precautions -At last visit her vitamin D level was normal and we will recheck again today to make sure she gets enough supplementation.  We also discussed about getting 1200 mg of calcium daily preferentially from the diet.  She was recently on omeprazole and plans to restart it.  We discussed that this can reduce the absorption of calcium Hoping that she does not need to continue the PPI for a long time -She has a history of smoking, which can contribute to osteoporosis, however, she quit more than 30 years ago. -When I last saw her, she was started on Reclast, and she was tolerating it well.  She had 1 injection in 2018 and 1 in 2022.  We discussed about Reclast mechanism of action and degree of fracture reduction.  She agreed to continue this.  Last injection was in 12/2021, tolerated well.  Plan to continue for a total of approximately 5 years -Will check the following labs today Orders Placed This Encounter  Procedures   Basic metabolic panel   Vitamin D, 25-hydroxy  - she will be due for another bone density scan in 05/2024 - will see pt back in 1 year  2.  Vitamin D insufficiency -She continues 3000 units vitamin D daily -Latest vitamin D level was normal last year Lab Results  Component Value Date   VD25OH 51.35 11/18/2021   VD25OH 43.47 10/02/2020   VD25OH 51.84 09/27/2019   VD25OH 45 01/25/2017   VD25OH 27 (L) 10/02/2016   VD25OH 35.7 07/24/2016  -We will recheck a vitamin D level today  3.  MNG -Euthyroid -She has no neck compression symptoms -Latest TSH obtained in 09/2020 was reassuring, none of the nodules needed intervention or follow-up -No further ultrasounds are needed unless she develops neck compression symptoms Component     Latest Ref Rng 11/25/2022  Sodium     135 - 145 mEq/L 140   Potassium     3.5 - 5.1 mEq/L 3.8   Chloride     96 - 112 mEq/L 103   CO2     19 - 32 mEq/L 30   Glucose     70 - 99 mg/dL 85   BUN     6 - 23 mg/dL 12   Creatinine     4.09 - 1.20 mg/dL 8.11   Calcium     8.4 - 10.5 mg/dL 91.4   VITD     78.29 - 100.00 ng/mL 41.65   GFR     >60.00 mL/min 67.35   Labs are normal.  Will go ahead and order the new Reclast infusion.  Carlus Pavlov, MD PhD [facility] Endocrinology

## 2022-11-25 NOTE — Patient Instructions (Addendum)
Please stop at the lab.  Continue Vitamin D 3000 units.  Continue Reclast for now.  Restart weightbearing exercises. Exercise for Strong Bones (from [facility]oporosis [facility]) There are two types of exercises that are important for building and maintaining bone density:  weight-bearing and muscle-strengthening exercises. Weight-bearing Exercises These exercises include activities that make you move against gravity while staying upright. Weight-bearing exercises can be high-impact or low-impact. High-impact weight-bearing exercises help build bones and keep them strong. If you have broken a bone due to osteoporosis or are at risk of breaking a bone, you may need to avoid high-impact exercises. If you're not sure, you should check with your healthcare provider. Examples of high-impact weight-bearing exercises are: Dancing Doing high-impact aerobics Hiking Jogging/running Jumping Rope Stair climbing Tennis Low-impact weight-bearing exercises can also help keep bones strong and are a safe alternative if you cannot do high-impact exercises. Examples of low-impact weight-bearing exercises are: Using elliptical training machines Doing low-impact aerobics Using stair-step machines Fast walking on a treadmill or outside Muscle-Strengthening Exercises These exercises include activities where you move your body, a weight or some other resistance against gravity. They are also known as resistance exercises and include: Lifting weights Using elastic exercise bands Using weight machines Lifting your own body weight Functional movements, such as standing and rising up on your toes Yoga and Pilates can also improve strength, balance and flexibility. However, certain positions may not be safe for people with osteoporosis or those at increased risk of broken bones. For example, exercises that have you bend forward may increase the chance of breaking a bone in the spine. A physical therapist  should be able to help you learn which exercises are safe and appropriate for you. Non-Impact Exercises Non-impact exercises can help you to improve balance, posture and how well you move in everyday activities. These exercises can also help to increase muscle strength and decrease the risk of falls and broken bones. Some of these exercises include: Balance exercises that strengthen your legs and test your balance, such as Tai Chi, can decrease your risk of falls. Posture exercises that improve your posture and reduce rounded or "sloping" shoulders can help you decrease the chance of breaking a bone, especially in the spine. Functional exercises that improve how well you move can help you with everyday activities and decrease your chance of falling and breaking a bone. For example, if you have trouble getting up from a chair or climbing stairs, you should do these activities as exercises. A physical therapist can teach you balance, posture and functional exercises. Starting a New Exercise Program If you haven't exercised regularly for a while, check with your healthcare provider before beginning a new exercise program--particularly if you have health problems such as heart disease, diabetes or high blood pressure. If you're at high risk of breaking a bone, you should work with a physical therapist to develop a safe exercise program. Once you have your healthcare provider's approval, start slowly. If you've already broken bones in the spine because of osteoporosis, be very careful to avoid activities that require reaching down, bending forward, rapid twisting motions, heavy lifting and those that increase your chance of a fall. As you get started, your muscles may feel sore for a day or two after you exercise. If soreness lasts longer, you may be working too hard and need to ease up. Exercises should be done in a pain-free range of motion. How Much Exercise Do You Need? Weight-bearing exercises 30 minutes  on most  days of the week. Do a 30-minutesession or multiple sessions spread out throughout the day. The benefits to your bones are the same.   Muscle-strengthening exercises Two to three days per week. If you don't have much time for strengthening/resistance training, do small amounts at a time. You can do just one body part each day. For example do arms one day, legs the next and trunk the next. You can also spread these exercises out during your normal day.  Balance, posture and functional exercises Every day or as often as needed. You may want to focus on one area more than the others. If you have fallen or lose your balance, spend time doing balance exercises. If you are getting rounded shoulders, work more on posture exercises. If you have trouble climbing stairs or getting up from the couch, do more functional exercises. You can also perform these exercises at one time or spread them during your day. Work with a phyiscal therapist to learn the right exercises for you.    You should have an endocrinology follow-up appointment in 1 year.

## 2022-12-14 ENCOUNTER — Other Ambulatory Visit: Payer: Self-pay | Admitting: Internal Medicine

## 2022-12-14 ENCOUNTER — Encounter: Payer: Self-pay | Admitting: Internal Medicine

## 2022-12-14 DIAGNOSIS — M810 Age-related osteoporosis without current pathological fracture: Secondary | ICD-10-CM

## 2022-12-14 NOTE — Telephone Encounter (Signed)
Can you place a new referral

## 2022-12-21 ENCOUNTER — Other Ambulatory Visit: Payer: Self-pay | Admitting: Internal Medicine

## 2022-12-21 DIAGNOSIS — M810 Age-related osteoporosis without current pathological fracture: Secondary | ICD-10-CM

## 2022-12-21 NOTE — Telephone Encounter (Signed)
Both referrals are old but in her chart it states that there is an Orders Only in her chart but when I go in that order there is nothing signed and a new referral needs to be placed so she can get contacted.

## 2022-12-23 ENCOUNTER — Ambulatory Visit
Admission: RE | Admit: 2022-12-23 | Discharge: 2022-12-23 | Disposition: A | Discharge status: Home / Self Care | Payer: Medicare Other | Source: Ambulatory Visit | Attending: Internal Medicine | Admitting: Internal Medicine

## 2022-12-23 DIAGNOSIS — Z1231 Encounter for screening mammogram for malignant neoplasm of breast: Secondary | ICD-10-CM

## 2022-12-25 ENCOUNTER — Telehealth: Payer: Self-pay

## 2022-12-25 ENCOUNTER — Other Ambulatory Visit: Payer: Self-pay

## 2022-12-25 NOTE — Telephone Encounter (Signed)
I called over to the infusion center. She states that there has to be a referral in the system and the ones that are in there are old.

## 2022-12-25 NOTE — Telephone Encounter (Signed)
Dr. Elvera Lennox, patient will be scheduled as soon as possible.  Auth Submission: NO AUTH NEEDED Site of care: Site of care: [facility] Payer: Medicare A/B with Aetna supplement Medication & CPT/J Code(s) submitted: Reclast (Zolendronic acid) [id number] Route of submission (phone, fax, portal):  Phone # Fax # Auth type: Buy/Bill PB Units/visits requested: 5mg  x 1 dose Reference number:  Approval from: 12/25/22 to 02/09/23

## 2022-12-29 ENCOUNTER — Institutional Professional Consult (permissible substitution) (HOSPITAL_BASED_OUTPATIENT_CLINIC_OR_DEPARTMENT_OTHER): Payer: Medicare Other | Admitting: Internal Medicine

## 2023-01-19 ENCOUNTER — Ambulatory Visit: Payer: Medicare Other

## 2023-01-19 VITALS — BP 148/79 | HR 53 | Temp 98.0°F | Resp 16 | Ht 66.5 in | Wt 159.8 lb

## 2023-01-19 DIAGNOSIS — M810 Age-related osteoporosis without current pathological fracture: Secondary | ICD-10-CM

## 2023-01-19 MED ORDER — DIPHENHYDRAMINE HCL 25 MG PO CAPS
25.0000 mg | ORAL_CAPSULE | Freq: Once | ORAL | Status: AC
  Administered 2023-01-19: 25 mg via ORAL
  Filled 2023-01-19: qty 1

## 2023-01-19 MED ORDER — ACETAMINOPHEN 325 MG PO TABS
650.0000 mg | ORAL_TABLET | Freq: Once | ORAL | Status: AC
  Administered 2023-01-19: 650 mg via ORAL
  Filled 2023-01-19: qty 2

## 2023-01-19 MED ORDER — ZOLEDRONIC ACID 5 MG/100ML IV SOLN
5.0000 mg | Freq: Once | INTRAVENOUS | Status: AC
  Administered 2023-01-19: 5 mg via INTRAVENOUS
  Filled 2023-01-19: qty 100

## 2023-01-19 NOTE — Progress Notes (Signed)
Diagnosis: Osteoporosis  Provider:  Chilton Greathouse MD  Procedure: IV Infusion  IV Type: Peripheral, IV Location: L Antecubital  Reclast (Zolendronic Acid), Dose: 5 mg  Infusion Start Time: 1108  Infusion Stop Time: 1138  Post Infusion IV Care: Observation period completed and Peripheral IV Discontinued  Discharge: Condition: Good, Destination: Home . AVS Provided  Performed by:  Wyvonne Lenz, RN

## 2023-02-22 ENCOUNTER — Other Ambulatory Visit: Payer: Self-pay | Admitting: *Deleted

## 2023-02-22 ENCOUNTER — Inpatient Hospital Stay: Payer: Medicare Other | Attending: Hematology and Oncology

## 2023-02-22 ENCOUNTER — Other Ambulatory Visit: Payer: Self-pay | Admitting: Hematology and Oncology

## 2023-02-22 DIAGNOSIS — Z87891 Personal history of nicotine dependence: Secondary | ICD-10-CM | POA: Diagnosis not present

## 2023-02-22 DIAGNOSIS — D472 Monoclonal gammopathy: Secondary | ICD-10-CM

## 2023-02-22 DIAGNOSIS — Z79899 Other long term (current) drug therapy: Secondary | ICD-10-CM | POA: Diagnosis not present

## 2023-02-22 DIAGNOSIS — K219 Gastro-esophageal reflux disease without esophagitis: Secondary | ICD-10-CM | POA: Insufficient documentation

## 2023-02-22 LAB — CBC WITH DIFFERENTIAL (CANCER CENTER ONLY)
Abs Immature Granulocytes: 0.02 10*3/uL (ref 0.00–0.07)
Basophils Absolute: 0 10*3/uL (ref 0.0–0.1)
Basophils Relative: 0 %
Eosinophils Absolute: 0.2 10*3/uL (ref 0.0–0.5)
Eosinophils Relative: 3 %
HCT: 42.5 % (ref 36.0–46.0)
Hemoglobin: 13.9 g/dL (ref 12.0–15.0)
Immature Granulocytes: 0 %
Lymphocytes Relative: 53 %
Lymphs Abs: 2.9 10*3/uL (ref 0.7–4.0)
MCH: 31 pg (ref 26.0–34.0)
MCHC: 32.7 g/dL (ref 30.0–36.0)
MCV: 94.7 fL (ref 80.0–100.0)
Monocytes Absolute: 0.4 10*3/uL (ref 0.1–1.0)
Monocytes Relative: 7 %
Neutro Abs: 2.1 10*3/uL (ref 1.7–7.7)
Neutrophils Relative %: 37 %
Platelet Count: 273 10*3/uL (ref 150–400)
RBC: 4.49 MIL/uL (ref 3.87–5.11)
RDW: 14 % (ref 11.5–15.5)
WBC Count: 5.7 10*3/uL (ref 4.0–10.5)
nRBC: 0 % (ref 0.0–0.2)

## 2023-02-22 LAB — LACTATE DEHYDROGENASE: LDH: 168 U/L (ref 98–192)

## 2023-02-22 LAB — CMP (CANCER CENTER ONLY)
ALT: 6 U/L (ref 0–44)
AST: 15 U/L (ref 15–41)
Albumin: 4.2 g/dL (ref 3.5–5.0)
Alkaline Phosphatase: 66 U/L (ref 38–126)
Anion gap: 5 (ref 5–15)
BUN: 10 mg/dL (ref 8–23)
CO2: 26 mmol/L (ref 22–32)
Calcium: 9.5 mg/dL (ref 8.9–10.3)
Chloride: 104 mmol/L (ref 98–111)
Creatinine: 0.82 mg/dL (ref 0.44–1.00)
GFR, Estimated: >60 mL/min (ref >60)
Glucose, Bld: 93 mg/dL (ref 70–99)
Potassium: 4 mmol/L (ref 3.5–5.1)
Sodium: 135 mmol/L (ref 135–145)
Total Bilirubin: 0.3 mg/dL (ref 0.0–1.2)
Total Protein: 7.6 g/dL (ref 6.5–8.1)

## 2023-02-23 LAB — KAPPA/LAMBDA LIGHT CHAINS
Kappa free light chain: 19.8 mg/L — ABNORMAL HIGH (ref 3.3–19.4)
Kappa, lambda light chain ratio: 1.62 (ref 0.26–1.65)
Lambda free light chains: 12.2 mg/L (ref 5.7–26.3)

## 2023-02-28 ENCOUNTER — Other Ambulatory Visit: Payer: Self-pay | Admitting: Hematology and Oncology

## 2023-02-28 DIAGNOSIS — D472 Monoclonal gammopathy: Secondary | ICD-10-CM

## 2023-02-28 NOTE — Progress Notes (Unsigned)
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 Gweneth Dimitri at [facility]. M protein 0.2, kappa 10.1, lambda 9.6, ratio 1.05. Met survey 11/24/2016 showed no evidence of lytic lesions.  2) 11/10/2017: M protein 0.1, kappa 12.5, lambda 8.2, ratio 1.52.  3) 02/14/2019: establish care with Dr. Leonides Schanz   Interval History:  Vickie Pearson 68 y.o. female with medical history significant for IgA kappa MGUS presents for a follow up visit. The patient's last visit was on 02/20/2022. In the interim since the last visit has had no major changes in her health.  On exam today Ms. Spearman Hall notes she has been well overall and interim since her last visit last year.  She reports she has had no major changes in her health.  She reports her energy levels are good and today she went line dancing, and wore herself out.  She reports that her appetite has been good and she is eating well.  She has not had any recent runny nose, sore throat, though she has had a cough.  For this cough she has had chest x-ray with no concerning abnormalities.  She reports it is a dry cough.  She reports that she is not having any trouble with bone pain or back pain.  She has had no hospitalizations or ER visits.  She reports her urine is clear with no bubbling, foaming, or change in the color.  She reports that she has restarted omeprazole for her GERD but otherwise no changes in her medications..  She otherwise denies having any fevers, chills, sweats, nausea, vomiting or diarrhea.   She has no new bone or back pain.  Full 10 point ROS is listed below.  MEDICAL HISTORY:  Past Medical History:  Diagnosis Date   Anxiety 08/27/20   Colon polyps    Coronary artery calcification of native artery 02/21/2019   Minimal calcification noted 11/2018 on coronary CTA   Costochondritis    DDD (degenerative disc disease), cervical    Degenerative arthritis of knee    Bilateral   Degenerative disc disease, cervical    Degenerative disc disease, cervical    Depression 08/27/20   Fibromyalgia    GERD (gastroesophageal reflux disease)    Hyperlipidemia    Inappropriate sinus tachycardia (HCC) 02/21/2019   Monoclonal gammopathy    Multiple thyroid nodules    Osteoporosis    Rheumatoid arthritis (HCC)     SURGICAL HISTORY: Past Surgical History:  Procedure Laterality Date   BREAST BIOPSY Right 2022   COLONOSCOPY  2012   COLONOSCOPY  2015   POLYPECTOMY      SOCIAL HISTORY: Social History   Socioeconomic History   Marital status: Widowed    Spouse name: Not on file   Number of children: Not on file   Years of education: Not on file   Highest education level: Bachelor's degree (e.g., BA, AB, BS)  Occupational History   Not on file  Tobacco Use   Smoking status: Former    Current packs/day: 0.00    Types: Cigarettes    Quit date: 07/24/1988    Years since quitting: 34.6    Passive exposure:  Never   Smokeless tobacco: Never  Vaping Use   Vaping status: Never Used  Substance and Sexual Activity   Alcohol use: Yes    Comment: occasionally   Drug use: No   Sexual activity: Not Currently    Birth control/protection: Abstinence  Other Topics Concern   Not on file  Social History Narrative   Not on file   Social Drivers of Health   Financial Resource Strain: Low Risk  (10/25/2022)   Overall Financial Resource Strain (CARDIA)    Difficulty of Paying Living Expenses: Not very hard  Food Insecurity: No Food Insecurity (10/25/2022)   Hunger Vital Sign    Worried  About Running Out of Food in the Last Year: Never true    Ran Out of Food in the Last Year: Never true  Transportation Needs: No Transportation Needs (10/25/2022)   PRAPARE - Administrator, Civil Service (Medical): No    Lack of Transportation (Non-Medical): No  Physical Activity: Sufficiently Active (10/25/2022)   Exercise Vital Sign    Days of Exercise per Week: 4 days    Minutes of Exercise per Session: 50 min  Stress: Stress Concern Present (10/25/2022)   [employer] of Occupational Health - Occupational Stress Questionnaire    Feeling of Stress : To some extent  Social Connections: Moderately Integrated (10/25/2022)   Social Connection and Isolation Panel [NHANES]    Frequency of Communication with Friends and Family: More than three times a week    Frequency of Social Gatherings with Friends and Family: Twice a week    Attends Religious Services: More than 4 times per year    Active Member of [employer] or Organizations: Yes    Attends Banker Meetings: More than 4 times per year    Marital Status: Widowed  Intimate Partner Violence: Not At Risk (08/25/2022)   Humiliation, Afraid, Rape, and Kick questionnaire    Fear of Current or Ex-Partner: No    Emotionally Abused: No    Physically Abused: No    Sexually Abused: No    FAMILY HISTORY: Family History  Problem Relation Age of Onset   Hypertension Mother    Colon polyps Mother    Stroke Mother    Arthritis Mother    Hypertension Father    Heart disease Father    Arthritis Father    Kidney disease Father    Hypertension Sister    Hyperlipidemia Sister    Hypertension Brother    Hyperlipidemia Brother    Esophageal cancer Neg Hx    Rectal cancer Neg Hx    Stomach cancer Neg Hx    Osteoporosis Neg Hx     ALLERGIES:  is allergic to penicillins, piroxicam, statins, sulfa antibiotics, and zetia [ezetimibe].  MEDICATIONS:  Current Outpatient Medications  Medication Sig Dispense Refill    cholecalciferol (VITAMIN D) 1000 units tablet Take 3,000 Units by mouth daily.     Evolocumab (REPATHA SURECLICK) 140 MG/ML SOAJ Inject 140 mg into the skin every 14 (fourteen) days. 6 mL 3   fluticasone (FLONASE) 50 MCG/ACT nasal spray Place 1 spray into both nostrils as needed for allergies or rhinitis.     hydrOXYzine (VISTARIL) 25 MG capsule Take 1 capsule (25 mg total) by mouth 2 (two) times daily as needed for anxiety. Please make PCP appt. 30 capsule 0   Lactobacillus (PROBIOTIC ACIDOPHILUS PO) Take 1 capsule by mouth daily.     Magnesium Citrate 100 MG TABS Take by mouth daily as  needed.     metoprolol succinate (TOPROL-XL) 25 MG 24 hr tablet Take 1 tablet (25 mg total) by mouth daily. 90 tablet 1   omeprazole (PRILOSEC) 20 MG capsule Take 1 capsule (20 mg total) by mouth daily. Take at least 3 weeks to see if cough improves 30 capsule 3   vitamin B-12 (CYANOCOBALAMIN) 1000 MCG tablet Take 1 tablet by mouth daily.     zinc sulfate 220 (50 Zn) MG capsule Take by mouth daily.      Zoledronic Acid (RECLAST IV)      No current facility-administered medications for this visit.    REVIEW OF SYSTEMS:   Constitutional: ( - ) fevers, ( - )  chills , ( - ) night sweats Eyes: ( - ) blurriness of vision, ( - ) double vision, ( - ) watery eyes Ears, nose, mouth, throat, and face: ( - ) mucositis, ( - ) sore throat Respiratory: ( - ) cough, ( - ) dyspnea, ( - ) wheezes Cardiovascular: ( - ) palpitation, ( - ) chest discomfort, ( - ) lower extremity swelling Gastrointestinal:  ( - ) nausea, ( - ) heartburn, ( - ) change in bowel habits Skin: ( - ) abnormal skin rashes Lymphatics: ( - ) new lymphadenopathy, ( - ) easy bruising Neurological: ( - ) numbness, ( - ) tingling, ( - ) new weaknesses Behavioral/Psych: ( - ) mood change, ( - ) new changes  All other systems were reviewed with the patient and are negative.  PHYSICAL EXAMINATION: ECOG PERFORMANCE STATUS: 1 - Symptomatic but completely  ambulatory  Vitals:   03/01/23 1401  BP: 138/76  Pulse: 71  Resp: 14  Temp: (!) 97.4 F (36.3 C)  SpO2: 100%     Filed Weights   03/01/23 1401  Weight: 160 lb 4.8 oz (72.7 kg)      GENERAL: well appearing elderly African American female alert, no distress and comfortable SKIN: skin color, texture, turgor are normal, no rashes or significant lesions EYES: conjunctiva are pink and non-injected, sclera clear LUNGS: clear to auscultation and percussion with normal breathing effort HEART: regular rate & rhythm and no murmurs and no lower extremity edema Musculoskeletal: no cyanosis of digits and no clubbing  PSYCH: alert & oriented x 3, fluent speech NEURO: no focal motor/sensory deficits  LABORATORY DATA:  I have reviewed the data as listed    Latest Ref Rng & Units 02/22/2023    2:11 PM 02/20/2022   10:32 AM 02/20/2021   10:18 AM  CBC  WBC 4.0 - 10.5 K/uL 5.7  5.5  5.5   Hemoglobin 12.0 - 15.0 g/dL 95.6  21.3  08.6   Hematocrit 36.0 - 46.0 % 42.5  39.8  39.3   Platelets 150 - 400 K/uL 273  256  235        Latest Ref Rng & Units 02/22/2023    2:11 PM 11/25/2022   10:40 AM 02/20/2022   10:32 AM  CMP  Glucose 70 - 99 mg/dL 93  85  74   BUN 8 - 23 mg/dL 10  12  10    Creatinine 0.44 - 1.00 mg/dL 5.78  4.69  6.29   Sodium 135 - 145 mmol/L 135  140  140   Potassium 3.5 - 5.1 mmol/L 4.0  3.8  3.6   Chloride 98 - 111 mmol/L 104  103  108   CO2 22 - 32 mmol/L 26  30  28    Calcium 8.9 -  10.3 mg/dL 9.5  95.2  9.7   Total Protein 6.5 - 8.1 g/dL 7.6   7.2   Total Bilirubin 0.0 - 1.2 mg/dL 0.3   0.3   Alkaline Phos 38 - 126 U/L 66   61   AST 15 - 41 U/L 15   17   ALT 0 - 44 U/L 6   9     Lab Results  Component Value Date   MPROTEIN Comment (A) 02/20/2022   MPROTEIN Not Observed 02/20/2021   MPROTEIN Not Observed 02/21/2020   Lab Results  Component Value Date   KPAFRELGTCHN 19.8 (H) 02/22/2023   KPAFRELGTCHN 22.7 (H) 02/20/2022   KPAFRELGTCHN 18.7 02/20/2021    LAMBDASER 12.2 02/22/2023   LAMBDASER 13.4 02/20/2022   LAMBDASER 10.4 02/20/2021   KAPLAMBRATIO 1.62 02/22/2023   KAPLAMBRATIO 13.42 02/27/2022   KAPLAMBRATIO 1.69 (H) 02/20/2022    RADIOGRAPHIC STUDIES: No results found.  ASSESSMENT & PLAN Vickie Pearson 68 y.o. female with medical history significant for IgA kappa MGUS  presents for a follow up visit.    After review of the labs and discussion with the patient, her finding is most consistent with monoclonal gammopathy of undetermined significance.  It appears at this has been very stable over the last 2 years, with a very low M protein.  Given these findings I think would be reasonable to continue to observe on a yearly basis with SPEP, UPEP, and serum free light chains.   Technically the patient meets the criteria for a bone marrow biopsy with an IgA MGUS, however given the extremely low levels of M protein and the lack of any anemia or increased calcium I think that observation would be appropriate at this time.   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 --yearly metastatic survey is due (last in Jan 2024) --M protein is only mildly elevated and Hgb/Ca/Cr are WNL. As such I recommend holding on a bone marrow biopsy at this time. --labs today show white blood cell 5.7, Hgb 13.9, MCV 94.7, Plt 273.  --SFLC stable, M protein levels pending.  --RTC in 1 years time for repeat serologies and metastatic survey.   Orders Placed This Encounter  Procedures   DG Bone Survey Met    Standing Status:   Future    Expected Date:   03/08/2023    Expiration Date:   02/29/2024    Reason for Exam (SYMPTOM  OR DIAGNOSIS REQUIRED):   MGUS, assess for lytic lesions    Preferred imaging location?:   [facility]    All questions were answered. The patient knows to call the clinic with any problems, questions or concerns.  A total of more than 25 minutes were spent on this  encounter and over half of that time was spent on counseling and coordination of care as outlined above.   Ulysees Barns, MD Department of Hematology/Oncology [facility] at [facility] Phone: [phone] Pager: [phone] Email: [email or web address]  03/01/2023 2:21 PM

## 2023-03-01 ENCOUNTER — Inpatient Hospital Stay (HOSPITAL_BASED_OUTPATIENT_CLINIC_OR_DEPARTMENT_OTHER): Payer: Medicare Other | Admitting: Hematology and Oncology

## 2023-03-01 ENCOUNTER — Other Ambulatory Visit: Payer: Self-pay

## 2023-03-01 VITALS — BP 138/76 | HR 71 | Temp 97.4°F | Resp 14 | Wt 160.3 lb

## 2023-03-01 DIAGNOSIS — D472 Monoclonal gammopathy: Secondary | ICD-10-CM

## 2023-03-01 LAB — MULTIPLE MYELOMA PANEL, SERUM
Albumin SerPl Elph-Mcnc: 3.9 g/dL (ref 2.9–4.4)
Albumin/Glob SerPl: 1.2 (ref 0.7–1.7)
Alpha 1: 0.2 g/dL (ref 0.0–0.4)
Alpha2 Glob SerPl Elph-Mcnc: 0.7 g/dL (ref 0.4–1.0)
B-Globulin SerPl Elph-Mcnc: 1.2 g/dL (ref 0.7–1.3)
Gamma Glob SerPl Elph-Mcnc: 1.2 g/dL (ref 0.4–1.8)
Globulin, Total: 3.4 g/dL (ref 2.2–3.9)
IgA: 291 mg/dL (ref 87–352)
IgG (Immunoglobin G), Serum: 1432 mg/dL (ref 586–1602)
IgM (Immunoglobulin M), Srm: 61 mg/dL (ref 26–217)
Total Protein ELP: 7.3 g/dL (ref 6.0–8.5)

## 2023-03-05 LAB — UPEP/UIFE/LIGHT CHAINS/TP, 24-HR UR
% BETA, Urine: 0 %
ALPHA 1 URINE: 0 %
Albumin, U: 100 %
Alpha 2, Urine: 0 %
Free Kappa Lt Chains,Ur: 14.23 mg/L (ref 1.17–86.46)
Free Kappa/Lambda Ratio: 7.69 (ref 1.83–14.26)
Free Lambda Lt Chains,Ur: 1.85 mg/L (ref 0.27–15.21)
GAMMA GLOBULIN URINE: 0 %
Total Protein, Urine-Ur/day: 72 mg/(24.h) (ref 30–150)
Total Protein, Urine: 4.7 mg/dL
Total Volume: 1525

## 2023-03-09 ENCOUNTER — Ambulatory Visit (HOSPITAL_COMMUNITY)
Admission: RE | Admit: 2023-03-09 | Discharge: 2023-03-09 | Disposition: A | Discharge status: Home / Self Care | Payer: Medicare Other | Source: Ambulatory Visit | Attending: Hematology and Oncology | Admitting: Hematology and Oncology

## 2023-03-09 DIAGNOSIS — D472 Monoclonal gammopathy: Secondary | ICD-10-CM | POA: Insufficient documentation

## 2023-03-09 NOTE — Progress Notes (Signed)
Office Visit Note  Patient: Vickie Pearson             Date of Birth: October 15, 1955           MRN: [id number]             PCP: Marcine Matar, MD Referring: Marcine Matar, MD Visit Date: 03/23/2023 Occupation: @GUAROCC @  Subjective:  Pain in both knee  History of Present Illness: Vickie Pearson is a 68 y.o. female with seropositive rheumatoid arthritis, osteoarthritis, degenerative disc disease and osteoporosis.  She returns today after her last visit in August 2024.  She states she continues to have pain and discomfort in her knee joints and would like to have cortisone injections to both knees.  She states none of the other joints are painful.  She continues to have some discomfort in her lower back which is manageable.  She denies any radiculopathy.  She is not taking any treatment for rheumatoid arthritis.  She is on Reclast infusions by Dr. Wyonia Hough for osteoporosis.    Activities of Daily Living:  Patient reports morning stiffness for 15 minutes.   Patient Reports nocturnal pain.  Difficulty dressing/grooming: Denies Difficulty climbing stairs: Reports Difficulty getting out of chair: Reports Difficulty using hands for taps, buttons, cutlery, and/or writing: Reports  Review of Systems  Constitutional:  Negative for fatigue.  HENT:  Negative for mouth sores and mouth dryness.   Eyes:  Positive for dryness.  Respiratory:  Negative for shortness of breath.   Cardiovascular:  Negative for chest pain and palpitations.  Gastrointestinal:  Negative for blood in stool, constipation and diarrhea.  Endocrine: Negative for increased urination.  Genitourinary:  Negative for involuntary urination.  Musculoskeletal:  Positive for joint pain, joint pain, myalgias, muscle weakness, morning stiffness and myalgias. Negative for gait problem, joint swelling and muscle tenderness.  Skin:  Negative for color change, rash, hair loss and sensitivity to sunlight.   Allergic/Immunologic: Negative for susceptible to infections.  Neurological:  Positive for dizziness and headaches.  Hematological:  Negative for swollen glands.  Psychiatric/Behavioral:  Positive for sleep disturbance. Negative for depressed mood. The patient is nervous/anxious.     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   Hx of abnormal cervical Pap smear 07/24/2016   OA (osteoarthritis) of knee 07/24/2016    Past Medical History:  Diagnosis Date   Anxiety 08/27/20   Colon polyps    Coronary artery calcification of native artery 02/21/2019   Minimal calcification noted 11/2018 on coronary CTA   Costochondritis    DDD (degenerative disc disease), cervical    Degenerative arthritis of knee    Bilateral   Degenerative disc disease, cervical    Degenerative disc disease, cervical    Depression 08/27/20   Fibromyalgia    GERD (gastroesophageal reflux disease)    Hyperlipidemia    Inappropriate sinus tachycardia (HCC) 02/21/2019   Monoclonal gammopathy    Multiple thyroid nodules    Osteoporosis  Rheumatoid arthritis (HCC)     Family History  Problem Relation Age of Onset   Hypertension Mother    Colon polyps Mother    Stroke Mother    Arthritis Mother    Hypertension Father    Heart disease Father    Arthritis Father    Kidney disease Father    Hypertension Sister    Hyperlipidemia Sister     Hypertension Brother    Hyperlipidemia Brother    Esophageal cancer Neg Hx    Rectal cancer Neg Hx    Stomach cancer Neg Hx    Osteoporosis Neg Hx    Past Surgical History:  Procedure Laterality Date   BREAST BIOPSY Right 2022   COLONOSCOPY  2012   COLONOSCOPY  2015   POLYPECTOMY     Social History   Social History Narrative   Not on file   Immunization History  Administered Date(s) Administered   Fluad Quad(high Dose 65+) 02/21/2020   Influenza, High Dose Seasonal PF 11/10/2018   Influenza, Quadrivalent, Recombinant, Inj, Pf 11/15/2017   Influenza,inj,Quad PF,6+ Mos 11/09/2016, 11/10/2018   PFIZER(Purple Top)SARS-COV-2 Vaccination 03/17/2019, 04/07/2019, 11/17/2019   PNEUMOCOCCAL CONJUGATE-20 10/20/2021     Objective: Vital Signs: BP 135/84 (BP Location: Left Arm, Patient Position: Sitting, Cuff Size: Normal)   Pulse 87   Resp 14   Ht 5\' 7"  (1.702 m)   Wt 160 lb (72.6 kg)   BMI 25.06 kg/m    Physical Exam Vitals and nursing note reviewed.  Constitutional:      Appearance: She is well-developed.  HENT:     Head: Normocephalic and atraumatic.  Eyes:     Conjunctiva/sclera: Conjunctivae normal.  Cardiovascular:     Rate and Rhythm: Normal rate and regular rhythm.     Heart sounds: Normal heart sounds.  Pulmonary:     Effort: Pulmonary effort is normal.     Breath sounds: Normal breath sounds.  Abdominal:     General: Bowel sounds are normal.     Palpations: Abdomen is soft.  Musculoskeletal:     Cervical back: Normal range of motion.  Lymphadenopathy:     Cervical: No cervical adenopathy.  Skin:    General: Skin is warm and dry.     Capillary Refill: Capillary refill takes less than 2 seconds.  Neurological:     Mental Status: She is alert and oriented to person, place, and time.  Psychiatric:        Behavior: Behavior normal.      Musculoskeletal Exam: She had good range of motion of her cervical, thoracic and lumbar spine.  Shoulders, and elbows  were in good range of motion.  She had bilateral extensor tenosynovitis.  She had limited range of motion of bilateral wrist joints.  There was no synovitis over MCPs PIPs or DIPs.  Bilateral PIP and DIP thickening was noted.  Hip joints were in good range of motion.  She had limited extension of bilateral knee joints with warmth and swelling in her knee joints.  Effusion was noted in her left knee joint.  There was no tenderness over ankles or MTPs.  CDAI Exam: CDAI Score: 13  Patient Global: 10 / 100; Provider Global: 40 / 100 Swollen: 4 ; Tender: 4  Joint Exam 03/23/2023      Right  Left  Wrist  Swollen Tender  Swollen Tender  Knee  Swollen Tender  Swollen Tender     Investigation: No additional findings.  Imaging: DG Bone Survey Met Result  Date: 03/18/2023 CLINICAL DATA:  MGUS, assess for lytic lesions. EXAM: METASTATIC BONE SURVEY COMPARISON:  02/27/2022. FINDINGS: No lytic or destructive lesion is seen within the bones. Degenerative changes are present in the cervical, thoracic, and lumbar spine. Scoliosis is noted. There is atherosclerotic calcification of the aorta. Degenerative changes are noted at the knees and wrists bilaterally. IMPRESSION: No lytic or destructive lesion is seen. Electronically Signed   By: Thornell Sartorius M.D.   On: 03/18/2023 20:25    Recent Labs: Lab Results  Component Value Date   WBC 5.7 02/22/2023   HGB 13.9 02/22/2023   PLT 273 02/22/2023   NA 135 02/22/2023   K 4.0 02/22/2023   CL 104 02/22/2023   CO2 26 02/22/2023   GLUCOSE 93 02/22/2023   BUN 10 02/22/2023   CREATININE 0.82 02/22/2023   BILITOT 0.3 02/22/2023   ALKPHOS 66 02/22/2023   AST 15 02/22/2023   ALT 6 02/22/2023   PROT 7.6 02/22/2023   ALBUMIN 4.2 02/22/2023   CALCIUM 9.5 02/22/2023   GFRAA 78 07/02/2020    Speciality Comments: No specialty comments available.  Procedures:  Large Joint Inj: bilateral knee on 03/23/2023 10:05 AM Indications: pain Details: 27 G 1.5 in needle,  medial approach  Arthrogram: No  Medications (Right): 1.5 mL lidocaine 1 %; 40 mg triamcinolone acetonide 40 MG/ML Medications (Left): 1.5 mL lidocaine 1 %; 40 mg triamcinolone acetonide 40 MG/ML Outcome: tolerated well, no immediate complications Procedure, treatment alternatives, risks and benefits explained, specific risks discussed. Consent was given by the patient. Immediately prior to procedure a time out was called to verify the correct patient, procedure, equipment, support staff and site/side marked as required. Patient was prepped and draped in the usual sterile fashion.     Allergies: Penicillins, Piroxicam, Statins, Sulfa antibiotics, and Zetia [ezetimibe]   Assessment / Plan:     Visit Diagnoses: Rheumatoid arthritis of multiple sites with negative rheumatoid factor (HCC) - History of severe erosive disease, mild extensor tenosynovitis of bilateral wrist joints.  Patient declined immunosuppressive therapy in the past.  She continues to have inflammation and swelling in multiple joints as described above.  Bilateral extensor tenosynovitis was noted.  Warmth and inflammation was noted in bilateral knee joints.  I did detailed discussion with the patient regarding the progression of rheumatoid arthritis and also systemic involvement from rheumatoid arthritis.  Different treatment options and their side effects were discussed.  Patient does not want to take any DMARDs or Biologics.  Chronic iritis of both eyes - No recent episodes.  Chronic pain of both knees - Severe osteoarthritis with limited extension of the knee joints.  She also had warmth and swelling in the bilateral knee joints.  Both knees injected in August 2024.  Patient requested repeat injections today.  After informed consent was obtained bilateral knee joints were injected with lidocaine and Kenalog as described above.  She tolerated the procedure well.  Primary osteoarthritis of both knees - Severe end-stage rheumatoid  arthritis and osteoarthritis overlap.  DDD (degenerative disc disease), cervical-she had good range of motion.  She continues to have chronic discomfort.  Degeneration of intervertebral disc of lumbar region without discogenic back pain or lower extremity pain-she continues to have some lower back discomfort.  A handout on back exercises was given.  Age-related osteoporosis without current pathological fracture - DEXA updated on May 14, 2022 T-score -3.1, BMD 0.610 left femoral neck.Reclast 2017, 2022 by Dr. Wyonia Hough.  Vitamin D deficiency  Mixed hyperlipidemia  Monoclonal  gammopathy - Followed by oncology  Orders: No orders of the defined types were placed in this encounter.  No orders of the defined types were placed in this encounter.    Follow-Up Instructions: Return in about 5 months (around 08/20/2023) for Rheumatoid arthritis, Osteoarthritis, Osteoporosis.   Pollyann Savoy, MD  Note - This record has been created using Animal nutritionist.  Chart creation errors have been sought, but may not always  have been located. Such creation errors do not reflect on  the standard of medical care.

## 2023-03-11 ENCOUNTER — Telehealth: Payer: Self-pay | Admitting: Cardiovascular Disease

## 2023-03-11 NOTE — Telephone Encounter (Signed)
error

## 2023-03-14 ENCOUNTER — Other Ambulatory Visit: Payer: Self-pay | Admitting: Cardiovascular Disease

## 2023-03-23 ENCOUNTER — Ambulatory Visit: Payer: Medicare Other | Attending: Rheumatology | Admitting: Rheumatology

## 2023-03-23 ENCOUNTER — Encounter: Payer: Self-pay | Admitting: Rheumatology

## 2023-03-23 VITALS — BP 135/84 | HR 87 | Resp 14 | Ht 67.0 in | Wt 160.0 lb

## 2023-03-23 DIAGNOSIS — M810 Age-related osteoporosis without current pathological fracture: Secondary | ICD-10-CM | POA: Diagnosis present

## 2023-03-23 DIAGNOSIS — M0609 Rheumatoid arthritis without rheumatoid factor, multiple sites: Secondary | ICD-10-CM | POA: Diagnosis not present

## 2023-03-23 DIAGNOSIS — E782 Mixed hyperlipidemia: Secondary | ICD-10-CM | POA: Diagnosis present

## 2023-03-23 DIAGNOSIS — D472 Monoclonal gammopathy: Secondary | ICD-10-CM | POA: Diagnosis present

## 2023-03-23 DIAGNOSIS — M170 Bilateral primary osteoarthritis of knee: Secondary | ICD-10-CM

## 2023-03-23 DIAGNOSIS — M25562 Pain in left knee: Secondary | ICD-10-CM | POA: Insufficient documentation

## 2023-03-23 DIAGNOSIS — E559 Vitamin D deficiency, unspecified: Secondary | ICD-10-CM

## 2023-03-23 DIAGNOSIS — H2013 Chronic iridocyclitis, bilateral: Secondary | ICD-10-CM

## 2023-03-23 DIAGNOSIS — M5030 Other cervical disc degeneration, unspecified cervical region: Secondary | ICD-10-CM | POA: Diagnosis present

## 2023-03-23 DIAGNOSIS — M51369 Other intervertebral disc degeneration, lumbar region without mention of lumbar back pain or lower extremity pain: Secondary | ICD-10-CM

## 2023-03-23 DIAGNOSIS — M25561 Pain in right knee: Secondary | ICD-10-CM | POA: Diagnosis present

## 2023-03-23 DIAGNOSIS — G8929 Other chronic pain: Secondary | ICD-10-CM

## 2023-03-23 MED ORDER — LIDOCAINE HCL 1 % IJ SOLN
1.5000 mL | INTRAMUSCULAR | Status: AC | PRN
  Administered 2023-03-23: 1.5 mL

## 2023-03-23 MED ORDER — TRIAMCINOLONE ACETONIDE 40 MG/ML IJ SUSP
40.0000 mg | INTRAMUSCULAR | Status: AC | PRN
  Administered 2023-03-23: 40 mg via INTRA_ARTICULAR

## 2023-03-23 NOTE — Patient Instructions (Signed)
 Cervical Strain and Sprain Rehab Ask your health care provider which exercises are safe for you. Do exercises exactly as told by your health care provider and adjust them as directed. It is normal to feel mild stretching, pulling, tightness, or discomfort as you do these exercises. Stop right away if you feel sudden pain or your pain gets worse. Do not begin these exercises until told by your health care provider. Stretching and range-of-motion exercises Cervical side bending  Using good posture, sit on a stable chair or stand up. Without moving your shoulders, slowly tilt your left / right ear to your shoulder until you feel a stretch in the neck muscles on the opposite side. You should be looking straight ahead. Hold for __________ seconds. Repeat with the other side of your neck. Repeat __________ times. Complete this exercise __________ times a day. Cervical rotation  Using good posture, sit on a stable chair or stand up. Slowly turn your head to the side as if you are looking over your left / right shoulder. Keep your eyes level with the ground. Stop when you feel a stretch along the side and the back of your neck. Hold for __________ seconds. Repeat this by turning to your other side. Repeat __________ times. Complete this exercise __________ times a day. Thoracic extension and pectoral stretch  Roll a towel or a small blanket so it is about 4 inches (10 cm) in diameter. Lie down on your back on a firm surface. Put the towel in the middle of your back across your spine. It should not be under your shoulder blades. Put your hands behind your head and let your elbows fall out to your sides. Hold for __________ seconds. Repeat __________ times. Complete this exercise __________ times a day. Strengthening exercises Upper cervical flexion  Lie on your back with a thin pillow behind your head or a small, rolled-up towel under your neck. Gently tuck your chin toward your chest and nod  your head down to look toward your feet. Do not lift your head off the pillow. Hold for __________ seconds. Release the tension slowly. Relax your neck muscles completely before you repeat this exercise. Repeat __________ times. Complete this exercise __________ times a day. Cervical extension  Stand about 6 inches (15 cm) away from a wall, with your back facing the wall. Place a soft object, about 6-8 inches (15-20 cm) in diameter, between the back of your head and the wall. A soft object could be a small pillow, a ball, or a folded towel. Gently tilt your head back and press into the soft object. Keep your jaw and forehead relaxed. Hold for __________ seconds. Release the tension slowly. Relax your neck muscles completely before you repeat this exercise. Repeat __________ times. Complete this exercise __________ times a day. Posture and body mechanics Body mechanics refer to the movements and positions of your body while you do your daily activities. Posture is part of body mechanics. Good posture and healthy body mechanics can help to relieve stress in your body's tissues and joints. Good posture means that your spine is in its natural S-curve position (your spine is neutral), your shoulders are pulled back slightly, and your head is not tipped forward. The following are general guidelines for using improved posture and body mechanics in your everyday activities. Sitting  When sitting, keep your spine neutral and keep your feet flat on the floor. Use a footrest, if needed, and keep your thighs parallel to the floor. Avoid rounding  your shoulders. Avoid tilting your head forward. When working at a desk or a computer, keep your desk at a height where your hands are slightly lower than your elbows. Slide your chair under your desk so you are close enough to maintain good posture. When working at a computer, place your monitor at a height where you are looking straight ahead and you do not have to  tilt your head forward or downward to look at the screen. Standing  When standing, keep your spine neutral and keep your feet about hip-width apart. Keep a slight bend in your knees. Your ears, shoulders, and hips should line up. When you do a task in which you stand in one place for a long time, place one foot up on a stable object that is 2-4 inches (5-10 cm) high, such as a footstool. This helps keep your spine neutral. Resting When lying down and resting, avoid positions that are most painful for you. Try to support your neck in a neutral position. You can use a contour pillow or a small rolled-up towel. Your pillow should support your neck but not push on it. This information is not intended to replace advice given to you by your health care provider. Make sure you discuss any questions you have with your health care provider. Document Revised: 06/01/2022 Document Reviewed: 08/18/2021 Else[employer] Patient Education  2024 [employer]  Low Back Sprain or Strain Rehab Ask your health care provider which exercises are safe for you. Do exercises exactly as told by your health care provider and adjust them as directed. It is normal to feel mild stretching, pulling, tightness, or discomfort as you do these exercises. Stop right away if you feel sudden pain or your pain gets worse. Do not begin these exercises until told by your health care provider. Stretching and range-of-motion exercises These exercises warm up your muscles and joints and improve the movement and flexibility of your back. These exercises also help to relieve pain, numbness, and tingling. Lumbar rotation  Lie on your back on a firm bed or the floor with your knees bent. Straighten your arms out to your sides so each arm forms a 90-degree angle (right angle) with a side of your body. Slowly move (rotate) both of your knees to one side of your body until you feel a stretch in your lower back (lumbar). Try not to let your shoulders  lift off the floor. Hold this position for __________ seconds. Tense your abdominal muscles and slowly move your knees back to the starting position. Repeat this exercise on the other side of your body. Repeat __________ times. Complete this exercise __________ times a day. Single knee to chest  Lie on your back on a firm bed or the floor with both legs straight. Bend one of your knees. Use your hands to move your knee up toward your chest until you feel a gentle stretch in your lower back and buttock. Hold your leg in this position by holding on to the front of your knee. Keep your other leg as straight as possible. Hold this position for __________ seconds. Slowly return to the starting position. Repeat with your other leg. Repeat __________ times. Complete this exercise __________ times a day. Prone extension on elbows  Lie on your abdomen on a firm bed or the floor (prone position). Prop yourself up on your elbows. Use your arms to help lift your chest up until you feel a gentle stretch in your abdomen and your lower  back. This will place some of your body weight on your elbows. If this is uncomfortable, try stacking pillows under your chest. Your hips should stay down, against the surface that you are lying on. Keep your hip and back muscles relaxed. Hold this position for __________ seconds. Slowly relax your upper body and return to the starting position. Repeat __________ times. Complete this exercise __________ times a day. Strengthening exercises These exercises build strength and endurance in your back. Endurance is the ability to use your muscles for a long time, even after they get tired. Pelvic tilt This exercise strengthens the muscles that lie deep in the abdomen. Lie on your back on a firm bed or the floor with your legs extended. Bend your knees so they are pointing toward the ceiling and your feet are flat on the floor. Tighten your lower abdominal muscles to press  your lower back against the floor. This motion will tilt your pelvis so your tailbone points up toward the ceiling instead of pointing to your feet or the floor. To help with this exercise, you may place a small towel under your lower back and try to push your back into the towel. Hold this position for __________ seconds. Let your muscles relax completely before you repeat this exercise. Repeat __________ times. Complete this exercise __________ times a day. Alternating arm and leg raises  Get on your hands and knees on a firm surface. If you are on a hard floor, you may want to use padding, such as an exercise mat, to cushion your knees. Line up your arms and legs. Your hands should be directly below your shoulders, and your knees should be directly below your hips. Lift your left leg behind you. At the same time, raise your right arm and straighten it in front of you. Do not lift your leg higher than your hip. Do not lift your arm higher than your shoulder. Keep your abdominal and back muscles tight. Keep your hips facing the ground. Do not arch your back. Keep your balance carefully, and do not hold your breath. Hold this position for __________ seconds. Slowly return to the starting position. Repeat with your right leg and your left arm. Repeat __________ times. Complete this exercise __________ times a day. Abdominal set with straight leg raise  Lie on your back on a firm bed or the floor. Bend one of your knees and keep your other leg straight. Tense your abdominal muscles and lift your straight leg up, 4-6 inches (10-15 cm) off the ground. Keep your abdominal muscles tight and hold this position for __________ seconds. Do not hold your breath. Do not arch your back. Keep it flat against the ground. Keep your abdominal muscles tense as you slowly lower your leg back to the starting position. Repeat with your other leg. Repeat __________ times. Complete this exercise __________  times a day. Single leg lower with bent knees Lie on your back on a firm bed or the floor. Tense your abdominal muscles and lift your feet off the floor, one foot at a time, so your knees and hips are bent in 90-degree angles (right angles). Your knees should be over your hips and your lower legs should be parallel to the floor. Keeping your abdominal muscles tense and your knee bent, slowly lower one of your legs so your toe touches the ground. Lift your leg back up to return to the starting position. Do not hold your breath. Do not let your back arch.  Keep your back flat against the ground. Repeat with your other leg. Repeat __________ times. Complete this exercise __________ times a day. Posture and body mechanics Good posture and healthy body mechanics can help to relieve stress in your body's tissues and joints. Body mechanics refers to the movements and positions of your body while you do your daily activities. Posture is part of body mechanics. Good posture means: Your spine is in its natural S-curve position (neutral). Your shoulders are pulled back slightly. Your head is not tipped forward (neutral). Follow these guidelines to improve your posture and body mechanics in your everyday activities. Standing  When standing, keep your spine neutral and your feet about hip-width apart. Keep a slight bend in your knees. Your ears, shoulders, and hips should line up. When you do a task in which you stand in one place for a long time, place one foot up on a stable object that is 2-4 inches (5-10 cm) high, such as a footstool. This helps keep your spine neutral. Sitting  When sitting, keep your spine neutral and keep your feet flat on the floor. Use a footrest, if necessary, and keep your thighs parallel to the floor. Avoid rounding your shoulders, and avoid tilting your head forward. When working at a desk or a computer, keep your desk at a height where your hands are slightly lower than your  elbows. Slide your chair under your desk so you are close enough to maintain good posture. When working at a computer, place your monitor at a height where you are looking straight ahead and you do not have to tilt your head forward or downward to look at the screen. Resting When lying down and resting, avoid positions that are most painful for you. If you have pain with activities such as sitting, bending, stooping, or squatting, lie in a position in which your body does not bend very much. For example, avoid curling up on your side with your arms and knees near your chest (fetal position). If you have pain with activities such as standing for a long time or reaching with your arms, lie with your spine in a neutral position and bend your knees slightly. Try the following positions: Lying on your side with a pillow between your knees. Lying on your back with a pillow under your knees. Lifting  When lifting objects, keep your feet at least shoulder-width apart and tighten your abdominal muscles. Bend your knees and hips and keep your spine neutral. It is important to lift using the strength of your legs, not your back. Do not lock your knees straight out. Always ask for help to lift heavy or awkward objects. This information is not intended to replace advice given to you by your health care provider. Make sure you discuss any questions you have with your health care provider. Document Revised: 06/01/2022 Document Reviewed: 04/15/2020 Elsevier Patient Education  2024 [employer] Exercises for Chronic Knee Pain Chronic knee pain is pain that lasts longer than 3 months. For most people with chronic knee pain, exercise and weight loss is an important part of treatment. Your health care provider may want you to focus on: Making the muscles that support your knee stronger. This can take pressure off your knee and reduce pain. Preventing knee stiffness. How far you can move your knee, keeping it there  or making it farther. Losing weight (if this applies) to take pressure off your knee, lower your risk for injury, and make it easier for  you to exercise. Your provider will help you make an exercise program that fits your needs and physical abilities. Below are simple, low-impact exercises you can do at home. Ask your provider or physical therapist how often you should do your exercise program and how many times to repeat each exercise. General safety tips  Get your provider's approval before doing any exercises. Start slowly and stop any time you feel pain. Do not exercise if your knee pain is flaring up. Warm up first. Stretching a cold muscle can cause an injury. Do 5-10 minutes of easy movement or light stretching before beginning your exercises. Do 5-10 minutes of low-impact activity (like walking or cycling) before starting strengthening exercises. Contact your provider any time you have pain during or after exercising. Exercise can cause discomfort but should not be painful. It is normal to be a little stiff or sore after exercising. Stretching and range-of-motion exercises Front thigh stretch  Stand up straight and support your body by holding on to a chair or resting one hand on a wall. With your legs straight and close together, bend one knee to lift your heel up toward your butt. Using one hand for support, grab your ankle with your free hand. Pull your foot up closer toward your butt to feel the stretch in front of your thigh. Hold the stretch for 30 seconds. Repeat __________ times. Complete this exercise __________ times a day. Back thigh stretch  Sit on the floor with your back straight and your legs out straight in front of you. Place the palms of your hands on the floor and slide them toward your feet as you bend at the hip. Try to touch your nose to your knees and feel the stretch in the back of your thighs. Hold for 30 seconds. Repeat __________ times. Complete this  exercise __________ times a day. Calf stretch  Stand facing a wall. Place the palms of your hands flat against the wall, arms extended, and lean slightly against the wall. Get into a lunge position with one leg bent at the knee and the other leg stretched out straight behind you. Keep both feet facing the wall and increase the bend in your knee while keeping the heel of the other leg flat on the ground. You should feel the stretch in your calf. Hold for 30 seconds. Repeat __________ times. Complete this exercise __________ times a day. Strengthening exercises Straight leg lift  Lie on your back with one knee bent and the other leg out straight. Slowly lift the straight leg without bending the knee. Lift until your foot is about 12 inches (30 cm) off the floor. Hold for 3-5 seconds and slowly lower your leg. Repeat __________ times. Complete this exercise __________ times a day. Single leg dip  Stand between two chairs and put both hands on the backs of the chairs for support. Extend one leg out straight with your body weight resting on the heel of the standing leg. Slowly bend your standing knee to dip your body to the level that is comfortable for you. Hold for 3-5 seconds. Repeat __________ times. Complete this exercise __________ times a day. Hamstring curls  Stand straight, knees close together, facing the back of a chair. Hold on to the back of a chair with both hands. Keep one leg straight. Bend the other knee while bringing the heel up toward the butt until the knee is bent at a 90-degree angle (right angle). Hold for 3-5 seconds. Repeat __________ times.  Complete this exercise __________ times a day. Wall squat  Stand straight with your back, hips, and head against a wall. Step forward one foot at a time with your back still against the wall. Your feet should be 2 feet (61 cm) from the wall at shoulder width. Keeping your back, hips, and head against the wall, slide down  the wall to as close to a sitting position as you can get. Hold for 5-10 seconds, then slowly slide back up. Repeat __________ times. Complete this exercise __________ times a day. Step-ups  Stand in front of a sturdy platform or stool that is about 6 inches (15 cm) high. Slowly step up with your left / right foot, keeping your knee in line with your hip and foot. Do not let your knee bend so far that you cannot see your toes. Hold on to a chair for balance, but do not use it for support. Slowly unlock your knee and lower yourself to the starting position. Repeat __________ times. Complete this exercise __________ times a day. Contact a health care provider if: Your exercises cause pain. Your pain is worse after you exercise. Your pain prevents you from doing your exercises. This information is not intended to replace advice given to you by your health care provider. Make sure you discuss any questions you have with your health care provider. Document Revised: 02/10/2022 Document Reviewed: 02/10/2022 Elsevier Patient Education  2024 [employer].

## 2023-03-24 ENCOUNTER — Telehealth: Payer: Self-pay | Admitting: *Deleted

## 2023-03-24 NOTE — Telephone Encounter (Signed)
-----   Message from Ulysees Barns IV sent at 03/21/2023  2:29 PM EST ----- Please let Mrs. Spearman- Margo Aye know that her met survey was negative for lytic lesions ----- Message ----- From: Interface, Rad Results In Sent: 03/18/2023   8:28 PM EST To: Jaci Standard, MD

## 2023-03-24 NOTE — Telephone Encounter (Signed)
TCT patient regarding recent bone survey scan. Spoke with her. Advised that her met survey was negative for lytic lesions. We did talk about her degenerative changes in her spine and knees and wrists-she has a h/o RA and does state that she will need knee replacements at some point. Assured her from a "lytic lesion" perspective-she was doing well with no lesions noted.  She voiced understanding.

## 2023-04-19 ENCOUNTER — Encounter: Payer: Self-pay | Admitting: Pharmacist Clinician (PhC)/ Clinical Pharmacy Specialist

## 2023-08-31 ENCOUNTER — Ambulatory Visit: Payer: Medicare Other | Attending: Family Medicine

## 2023-08-31 DIAGNOSIS — Z0000 Encounter for general adult medical examination without abnormal findings: Secondary | ICD-10-CM

## 2023-08-31 NOTE — Patient Instructions (Signed)
 [name] , Thank you for taking time out of your busy schedule to complete your Annual Wellness Visit with me. I enjoyed our conversation and look forward to speaking with you again next year. I, as well as your care team,  appreciate your ongoing commitment to your health goals. Please review the following plan we discussed and let me know if I can assist you in the future. Your Game plan/ To Do List    Referrals: If you haven't heard from the office you've been referred to, please reach out to them at the phone provided.  N/a Follow up Visits: Next Medicare AWV with our clinical staff: 09/05/2024 at 9:50   Have you seen your provider in the last 6 months (3 months if uncontrolled diabetes)? No Next Office Visit with your provider: will call to schedule  Clinician Recommendations:  Aim for 30 minutes of exercise or brisk walking, 6-8 glasses of water, and 5 servings of fruits and vegetables each day.       This is a list of the screening recommended for you and due dates:  Health Maintenance  Topic Date Due   DTaP/Tdap/Td vaccine (1 - Tdap) Never done   Zoster (Shingles) Vaccine (1 of 2) Never done   COVID-19 Vaccine (5 - Pfizer risk 2024-25 season) 06/08/2023   Flu Shot  09/10/2023   Medicare Annual Wellness Visit  08/30/2024   Colon Cancer Screening  09/18/2024   Mammogram  12/22/2024   Pneumococcal Vaccine for age over 22  Completed   DEXA scan (bone density measurement)  Completed   Hepatitis C Screening  Completed   Hepatitis B Vaccine  Aged Out   HPV Vaccine  Aged Out   Meningitis B Vaccine  Aged Out    Advanced directives: (ACP Link)Information on Advanced Care Planning can be found at [place]  Best boy Advance Health Care Directives Advance Health Care Directives. [email or web address]  Advance Care Planning is important because it:  [x]  Makes sure you receive the medical care that is consistent with your values, goals, and preferences  [x]  It provides  guidance to your family and loved ones and reduces their decisional burden about whether or not they are making the right decisions based on your wishes.  Follow the link provided in your after visit summary or read over the paperwork we have mailed to you to help you started getting your Advance Directives in place. If you need assistance in completing these, please reach out to us  so that we can help you!  See attachments for Preventive Care and Fall Prevention Tips.

## 2023-08-31 NOTE — Progress Notes (Signed)
 Subjective:   Vickie Pearson is a 68 y.o. who presents for a Medicare Wellness preventive visit.  As a reminder, Annual Wellness Visits don't include a physical exam, and some assessments may be limited, especially if this visit is performed virtually. We may recommend an in-person follow-up visit with your provider if needed.  Visit Complete: Virtual I connected with  MEAGAN ANCONA on 08/31/23 by a audio enabled telemedicine application and verified that I am speaking with the correct person using two identifiers.  Patient Location: Home  Provider Location: Office/Clinic  I discussed the limitations of evaluation and management by telemedicine. The patient expressed understanding and agreed to proceed.  Vital Signs: Because this visit was a virtual/telehealth visit, some criteria may be missing or patient reported. Any vitals not documented were not able to be obtained and vitals that have been documented are patient reported.  VideoError- Librarian, academic were attempted between this provider and patient, however failed, due to patient having technical difficulties OR patient did not have access to video capability.  We continued and completed visit with audio only.   Persons Participating in Visit: Patient.  AWV Questionnaire: Yes: Patient Medicare AWV questionnaire was completed by the patient on 08/27/2023; I have confirmed that all information answered by patient is correct and no changes since this date.  Cardiac Risk Factors include: advanced age (>62men, >39 women);dyslipidemia     Objective:    Today's Vitals   There is no height or weight on file to calculate BMI.     08/31/2023    9:31 AM 01/19/2023   10:32 AM 08/25/2022    5:54 PM 02/20/2022   11:19 AM 10/11/2020    1:50 PM 02/18/2017   10:16 AM 11/18/2016   10:07 AM  Advanced Directives  Does Patient Have a Medical Advance Directive? No No No No No No  No   Would  patient like information on creating a medical advance directive? No - Patient declined  Yes (MAU/Ambulatory/Procedural Areas - Information given) No - Patient declined  No - Patient declined  No - Patient declined      Data saved with a previous flowsheet row definition    Current Medications (verified) Outpatient Encounter Medications as of 08/31/2023  Medication Sig   cholecalciferol (VITAMIN D ) 1000 units tablet Take 3,000 Units by mouth daily.   Evolocumab  (REPATHA  SURECLICK) 140 MG/ML SOAJ Inject 140 mg into the skin every 14 (fourteen) days.   fluticasone (FLONASE) 50 MCG/ACT nasal spray Place 1 spray into both nostrils as needed for allergies or rhinitis.   hydrOXYzine  (VISTARIL ) 25 MG capsule Take 1 capsule (25 mg total) by mouth 2 (two) times daily as needed for anxiety. Please make PCP appt.   Lactobacillus (PROBIOTIC ACIDOPHILUS PO) Take 1 capsule by mouth daily.   Magnesium Citrate 100 MG TABS Take by mouth daily as needed.   metoprolol  succinate (TOPROL -XL) 25 MG 24 hr tablet Take 1 tablet by mouth once daily   omeprazole  (PRILOSEC) 20 MG capsule Take 1 capsule (20 mg total) by mouth daily. Take at least 3 weeks to see if cough improves   vitamin B-12 (CYANOCOBALAMIN) 1000 MCG tablet Take 1 tablet by mouth daily.   zinc sulfate 220 (50 Zn) MG capsule Take by mouth daily.    Zoledronic  Acid (RECLAST  IV)    No facility-administered encounter medications on file as of 08/31/2023.    Allergies (verified) Penicillins, Piroxicam, Statins, Sulfa antibiotics, and Zetia  [ezetimibe ]   History:  Past Medical History:  Diagnosis Date   Anxiety 08/27/20   Colon polyps    Coronary artery calcification of native artery 02/21/2019   Minimal calcification noted 11/2018 on coronary CTA   Costochondritis    DDD (degenerative disc disease), cervical    Degenerative arthritis of knee    Bilateral   Degenerative disc disease, cervical    Degenerative disc disease, cervical    Depression  08/27/20   Fibromyalgia    GERD (gastroesophageal reflux disease)    Hyperlipidemia    Inappropriate sinus tachycardia (HCC) 02/21/2019   Monoclonal gammopathy    Multiple thyroid  nodules    Osteoporosis    Rheumatoid arthritis (HCC)    Past Surgical History:  Procedure Laterality Date   BREAST BIOPSY Right 2022   COLONOSCOPY  2012   COLONOSCOPY  2015   POLYPECTOMY     Family History  Problem Relation Age of Onset   Hypertension Mother    Colon polyps Mother    Stroke Mother    Arthritis Mother    Hypertension Father    Heart disease Father    Arthritis Father    Kidney disease Father    Hypertension Sister    Hyperlipidemia Sister    Hypertension Brother    Hyperlipidemia Brother    Esophageal cancer Neg Hx    Rectal cancer Neg Hx    Stomach cancer Neg Hx    Osteoporosis Neg Hx    Social History   Socioeconomic History   Marital status: Widowed    Spouse name: Not on file   Number of children: Not on file   Years of education: Not on file   Highest education level: Bachelor's degree (e.g., BA, AB, BS)  Occupational History   Not on file  Tobacco Use   Smoking status: Former    Current packs/day: 0.00    Types: Cigarettes    Quit date: 07/24/1988    Years since quitting: 35.1    Passive exposure: Never   Smokeless tobacco: Never  Vaping Use   Vaping status: Never Used  Substance and Sexual Activity   Alcohol use: Not Currently    Comment: occasionally   Drug use: No   Sexual activity: Not Currently    Birth control/protection: Abstinence  Other Topics Concern   Not on file  Social History Narrative   Not on file   Social Drivers of Health   Financial Resource Strain: Low Risk  (08/27/2023)   Overall Financial Resource Strain (CARDIA)    Difficulty of Paying Living Expenses: Not very hard  Food Insecurity: No Food Insecurity (08/27/2023)   Hunger Vital Sign    Worried About Running Out of Food in the Last Year: Never true    Ran Out of Food in the  Last Year: Never true  Transportation Needs: No Transportation Needs (08/27/2023)   PRAPARE - Administrator, Civil Service (Medical): No    Lack of Transportation (Non-Medical): No  Physical Activity: Sufficiently Active (08/27/2023)   Exercise Vital Sign    Days of Exercise per Week: 4 days    Minutes of Exercise per Session: 60 min  Stress: Stress Concern Present (08/27/2023)   [employer] of Occupational Health - Occupational Stress Questionnaire    Feeling of Stress: To some extent  Social Connections: Moderately Integrated (08/27/2023)   Social Connection and Isolation Panel    Frequency of Communication with Friends and Family: More than three times a week    Frequency of Social  Gatherings with Friends and Family: Three times a week    Attends Religious Services: More than 4 times per year    Active Member of Clubs or Organizations: Yes    Attends Banker Meetings: 1 to 4 times per year    Marital Status: Widowed    Tobacco Counseling Counseling given: Not Answered    Clinical Intake:  Pre-visit preparation completed: Yes  Pain : No/denies pain     Nutritional Risks: None Diabetes: No  Lab Results  Component Value Date   HGBA1C 5.5 10/10/2021     How often do you need to have someone help you when you read instructions, pamphlets, or other written materials from your doctor or pharmacy?: 1 - Never  Interpreter Needed?: No  Information entered by :: NAllen LPN   Activities of Daily Living     08/31/2023    8:37 AM  In your present state of health, do you have any difficulty performing the following activities:  Hearing? 0  Vision? 0  Difficulty concentrating or making decisions? 0  Walking or climbing stairs? 0  Dressing or bathing? 0  Doing errands, shopping? 0  Preparing Food and eating ? N  Using the Toilet? N  In the past six months, have you accidently leaked urine? Y  Do you have problems with loss of bowel control?  N  Managing your Medications? N  Managing your Finances? N  Housekeeping or managing your Housekeeping? N    Patient Care Team: Vicci Barnie NOVAK, MD as PCP - General (Internal Medicine) Raford Riggs, MD as PCP - Cardiology (Cardiology) Trixie File, MD as Consulting Physician (Internal Medicine) Dolphus Reiter, MD as Consulting Physician (Rheumatology) [place] Federico Norleen [name], MD as Consulting Physician (Hematology and Oncology)  I have updated your Care Teams any recent Medical Services you may have received from other providers in the past year.     Assessment:   This is a routine wellness examination for Danitra.  Hearing/Vision screen Hearing Screening - Comments:: Denies hearing issues Vision Screening - Comments:: Regular eye exams, Dr. Tobie   Goals Addressed             This Visit's Progress    Patient Stated       08/31/2023, wants to start back to water aerobics       Depression Screen     08/31/2023    9:34 AM 01/19/2023   10:32 AM 08/25/2022    5:52 PM 10/10/2021   10:05 AM 05/27/2021   10:18 AM 05/06/2021   10:09 AM 04/15/2021   10:19 AM  PHQ 2/9 Scores  PHQ - 2 Score 1 0 2 2 0 0 0  PHQ- 9 Score 3  5 5 2 2 2     Fall Risk     08/31/2023    8:37 AM 01/19/2023   10:32 AM 10/29/2022    2:31 PM 08/25/2022    5:53 PM 08/22/2022    9:06 AM  Fall Risk   Falls in the past year? 0 0 0 0 0  Number falls in past yr: 0  0 0 0  Injury with Fall? 0  0 0 0  Risk for fall due to : Medication side effect No Fall Risks No Fall Risks No Fall Risks   Follow up Falls prevention discussed;Falls evaluation completed Falls evaluation completed  Falls prevention discussed;Education provided;Falls evaluation completed     MEDICARE RISK AT HOME:  Medicare Risk at Home Any  stairs in or around the home?: (Patient-Rptd) No Home free of loose throw rugs in walkways, pet beds, electrical cords, etc?: (Patient-Rptd) Yes Adequate lighting in your  home to reduce risk of falls?: (Patient-Rptd) Yes Life alert?: (Patient-Rptd) No Use of a cane, walker or w/c?: (Patient-Rptd) No Grab bars in the bathroom?: (Patient-Rptd) No Shower chair or bench in shower?: (Patient-Rptd) Yes Elevated toilet seat or a handicapped toilet?: (Patient-Rptd) No  TIMED UP AND GO:  Was the test performed?  No  Cognitive Function: 6CIT completed        08/31/2023    9:35 AM 08/25/2022    5:54 PM  6CIT Screen  What Year? 0 points 0 points  What month? 0 points 0 points  What time? 0 points 0 points  Count back from 20 0 points 0 points  Months in reverse 0 points 0 points  Repeat phrase 0 points 0 points  Total Score 0 points 0 points    Immunizations Immunization History  Administered Date(s) Administered   Fluad Quad(high Dose 65+) 02/21/2020   Influenza, High Dose Seasonal PF 11/10/2018   Influenza, Quadrivalent, Recombinant, Inj, Pf 11/15/2017   Influenza,inj,Quad PF,6+ Mos 11/09/2016, 11/10/2018   PFIZER(Purple Top)SARS-COV-2 Vaccination 03/17/2019, 04/07/2019, 11/17/2019   PNEUMOCOCCAL CONJUGATE-20 10/20/2021   Pfizer(Comirnaty)Fall Seasonal Vaccine 12 years and older 12/08/2022    Screening Tests Health Maintenance  Topic Date Due   DTaP/Tdap/Td (1 - Tdap) Never done   Zoster Vaccines- Shingrix (1 of 2) Never done   COVID-19 Vaccine (5 - Pfizer risk 2024-25 season) 06/08/2023   INFLUENZA VACCINE  09/10/2023   Medicare Annual Wellness (AWV)  08/30/2024   Colonoscopy  09/18/2024   MAMMOGRAM  12/22/2024   Pneumococcal Vaccine: 50+ Years  Completed   DEXA SCAN  Completed   Hepatitis C Screening  Completed   Hepatitis B Vaccines  Aged Out   HPV VACCINES  Aged Out   Meningococcal B Vaccine  Aged Out    Health Maintenance  Health Maintenance Due  Topic Date Due   DTaP/Tdap/Td (1 - Tdap) Never done   Zoster Vaccines- Shingrix (1 of 2) Never done   COVID-19 Vaccine (5 - Pfizer risk 2024-25 season) 06/08/2023   Health  Maintenance Items Addressed: Due for TDAP and shingles vaccine.  Additional Screening:  Vision Screening: Recommended annual ophthalmology exams for early detection of glaucoma and other disorders of the eye. Would you like a referral to an eye doctor? No    Dental Screening: Recommended annual dental exams for proper oral hygiene  Community Resource Referral / Chronic Care Management: CRR required this visit?  No   CCM required this visit?  No   Plan:    I have personally reviewed and noted the following in the patient's chart:   Medical and social history Use of alcohol, tobacco or illicit drugs  Current medications and supplements including opioid prescriptions. Patient is not currently taking opioid prescriptions. Functional ability and status Nutritional status Physical activity Advanced directives List of other physicians Hospitalizations, surgeries, and ER visits in previous 12 months Vitals Screenings to include cognitive, depression, and falls Referrals and appointments  In addition, I have reviewed and discussed with patient certain preventive protocols, quality metrics, and best practice recommendations. A written personalized care plan for preventive services as well as general preventive health recommendations were provided to patient.   Ardella FORBES Dawn, LPN   [id number]   After Visit Summary: (MyChart) Due to this being a telephonic visit, the after visit summary  with patients personalized plan was offered to patient via MyChart   Notes: Nothing significant to report at this time.

## 2023-09-01 ENCOUNTER — Telehealth: Payer: Self-pay | Admitting: Internal Medicine

## 2023-09-01 NOTE — Telephone Encounter (Signed)
-----   Message from Barnie Louder sent at 09/01/2023 12:08 PM EDT ----- Regarding: Appt Over due for routine follow up. Please give appt.

## 2023-09-01 NOTE — Telephone Encounter (Addendum)
 Called patient, unable to reach patient. Left a detailed voicemail for patient to call back. Please schedule a follow up appointment if the patient calls. Thank you.

## 2023-09-07 NOTE — Progress Notes (Signed)
 Office Visit Note  Patient: Vickie Pearson             Date of Birth: 19-Nov-1955           MRN: [id number]             PCP: Vicci Barnie NOVAK, MD Referring: Vicci Barnie NOVAK, MD Visit Date: 09/21/2023 Occupation: @GUAROCC @  Subjective:  Pain in both knees  History of Present Illness: Vickie Pearson is a 68 y.o. female with seropositive rheumatoid arthritis, osteoarthritis, degenerative disc disease and osteoporosis.  She returns today after her last visit in February 2025.  She states the cortisone injections at the last visit were helpful.  Now the pain has recurred.  She would like to have repeat injections.  None of the other joints are painful.  She has off-and-on discomfort in her lower back.  She has been getting Reclast  infusions through her endocrinologist.    Activities of Daily Living:  Patient reports morning stiffness for 15 minutes.   Patient Reports nocturnal pain.  Difficulty dressing/grooming: Denies Difficulty climbing stairs: Denies Difficulty getting out of chair: Denies Difficulty using hands for taps, buttons, cutlery, and/or writing: Reports  Review of Systems  Constitutional:  Negative for fatigue.  HENT:  Negative for mouth sores and mouth dryness.   Eyes:  Positive for dryness.  Respiratory:  Negative for shortness of breath.   Cardiovascular:  Negative for chest pain and palpitations.  Gastrointestinal:  Negative for blood in stool, constipation and diarrhea.  Endocrine: Positive for increased urination.  Genitourinary:  Positive for involuntary urination.  Musculoskeletal:  Positive for joint pain, joint pain and morning stiffness. Negative for gait problem, joint swelling, myalgias, muscle weakness, muscle tenderness and myalgias.  Skin:  Negative for color change, rash, hair loss and sensitivity to sunlight.  Allergic/Immunologic: Negative for susceptible to infections.  Neurological:  Positive for dizziness and headaches.   Hematological:  Negative for swollen glands.  Psychiatric/Behavioral:  Positive for depressed mood and sleep disturbance. The patient is nervous/anxious.     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   Hx of abnormal cervical Pap smear 07/24/2016   OA (osteoarthritis) of knee 07/24/2016    Past Medical History:  Diagnosis Date   Anxiety 08/27/20   Colon polyps    Coronary artery calcification of native artery 02/21/2019   Minimal calcification noted 11/2018 on coronary CTA   Costochondritis    DDD (degenerative disc disease), cervical    Degenerative arthritis of knee    Bilateral   Degenerative disc disease, cervical    Degenerative disc disease, cervical    Depression 08/27/20   Fibromyalgia    GERD (gastroesophageal reflux disease)    Hyperlipidemia    Inappropriate sinus tachycardia (HCC) 02/21/2019   Monoclonal gammopathy    Multiple thyroid  nodules    Osteoporosis    Rheumatoid arthritis (HCC)     Family History  Problem Relation Age of Onset   Hypertension Mother  Colon polyps Mother    Stroke Mother    Arthritis Mother    Hypertension Father    Heart disease Father    Arthritis Father    Kidney disease Father    Hypertension Sister    Hyperlipidemia Sister    Hypertension Brother    Hyperlipidemia Brother    Esophageal cancer Neg Hx    Rectal cancer Neg Hx    Stomach cancer Neg Hx     Osteoporosis Neg Hx    Past Surgical History:  Procedure Laterality Date   BREAST BIOPSY Right 2022   COLONOSCOPY  2012   COLONOSCOPY  2015   POLYPECTOMY     Social History   Social History Narrative   Not on file   Immunization History  Administered Date(s) Administered   Fluad Quad(high Dose 65+) 02/21/2020   Influenza, High Dose Seasonal PF 11/10/2018   Influenza, Quadrivalent, Recombinant, Inj, Pf 11/15/2017   Influenza,inj,Quad PF,6+ Mos 11/09/2016, 11/10/2018   PFIZER(Purple Top)SARS-COV-2 Vaccination 03/17/2019, 04/07/2019, 11/17/2019   PNEUMOCOCCAL CONJUGATE-20 10/20/2021   Pfizer(Comirnaty)Fall Seasonal Vaccine 12 years and older 12/08/2022     Objective: Vital Signs: BP 132/84 (BP Location: Left Arm, Patient Position: Sitting, Cuff Size: Normal)   Pulse 68   Resp 16   Ht 5' 7 (1.702 m)   Wt 157 lb 3.2 oz (71.3 kg)   BMI 24.62 kg/m    Physical Exam Vitals and nursing note reviewed.  Constitutional:      Appearance: She is well-developed.  HENT:     Head: Normocephalic and atraumatic.  Eyes:     Conjunctiva/sclera: Conjunctivae normal.  Cardiovascular:     Rate and Rhythm: Normal rate and regular rhythm.     Heart sounds: Normal heart sounds.  Pulmonary:     Effort: Pulmonary effort is normal.     Breath sounds: Normal breath sounds.  Abdominal:     General: Bowel sounds are normal.     Palpations: Abdomen is soft.  Musculoskeletal:     Cervical back: Normal range of motion.  Lymphadenopathy:     Cervical: No cervical adenopathy.  Skin:    General: Skin is warm and dry.     Capillary Refill: Capillary refill takes less than 2 seconds.  Neurological:     Mental Status: She is alert and oriented to person, place, and time.  Psychiatric:        Behavior: Behavior normal.      Musculoskeletal Exam: Cervical spine was in good range of motion.  Shoulders, elbow joints and wrist joints with good range of motion.  She synovial thickening and synovitis  of bilateral wrist joints.  She synovial thickening over some of the MCPs and PIP joints.  She had warmth and swelling in the bilateral knee joints.  CDAI Exam: CDAI Score: 16  Patient Global: 20 / 100; Provider Global: 50 / 100 Swollen: 5 ; Tender: 4  Joint Exam 09/21/2023      Right  Left  Wrist  Swollen Tender  Swollen Tender  PIP 2 (finger)     Swollen   Knee  Swollen Tender  Swollen Tender     Investigation: No additional findings.  Imaging: No results found.  Recent Labs: Lab Results  Component Value Date   WBC 5.7 02/22/2023   HGB 13.9 02/22/2023   PLT 273 02/22/2023   NA 135 02/22/2023   K 4.0 02/22/2023   CL 104 02/22/2023   CO2 26 02/22/2023   GLUCOSE 93 02/22/2023  BUN 10 02/22/2023   CREATININE 0.82 02/22/2023   BILITOT 0.3 02/22/2023   ALKPHOS 66 02/22/2023   AST 15 02/22/2023   ALT 6 02/22/2023   PROT 7.6 02/22/2023   ALBUMIN 4.2 02/22/2023   CALCIUM 9.5 02/22/2023   GFRAA 78 07/02/2020    Speciality Comments: No specialty comments available.  Procedures:  Large Joint Inj: bilateral knee on 09/21/2023 10:22 AM Indications: pain Details: 27 G 1.5 in needle, medial approach  Arthrogram: No  Medications (Right): 1.5 mL lidocaine  1 %; 40 mg triamcinolone  acetonide 40 MG/ML Medications (Left): 1.5 mL lidocaine  1 %; 40 mg triamcinolone  acetonide 40 MG/ML Outcome: tolerated well, no immediate complications  And the risk of infection, tendon injury, nerve injury, hypopigmentation and dermal atrophy were discussed. Procedure, treatment alternatives, risks and benefits explained, specific risks discussed. Consent was given by the patient. Immediately prior to procedure a time out was called to verify the correct patient, procedure, equipment, support staff and site/side marked as required. Patient was prepped and draped in the usual sterile fashion.     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  I had a discussion regarding use of DMARDs again but patient declined.  She states her symptoms are manageable.  Chronic iritis of both eyes - No recent episodes.  Chronic pain of both knees -she continues to have pain and discomfort in the bilateral knee joints.  She had good response to the cortisone injection given to her in February 2025.  She requested bilateral knee joint injections.  She has bilateral severe osteoarthritis and limited extension of the knee joints.  After informed consent was obtained and side effects were discussed bilateral knee joints were injected with lidocaine  and Kenalog  as described above.  Patient tolerated the procedure well.  Postprocedure instructions were given.  Primary osteoarthritis of both knees - Severe end-stage rheumatoid arthritis and osteoarthritis overlap.  Lower extremity muscle strengthening signs were discussed.  DDD (degenerative disc disease), cervical-currently not symptomatic.  Degeneration of intervertebral disc of lumbar region without discogenic back pain or lower extremity pain - Chronic discomfort.  Patient reports intermittent discomfort.    Age-related osteoporosis without current pathological fracture - DEXA May 14, 2022: T-score -3.1, BMD 0.610 left femoral neck.  Reclast  2017 and 2022 by Dr. Irean.  Vitamin D  deficiency  Monoclonal gammopathy - Followed by oncology.  Mixed hyperlipidemia  Multiple thyroid  nodules  Orders: Orders Placed This Encounter  Procedures   Large Joint Inj   No orders of the defined types were placed in this encounter.   Follow-Up Instructions: Return in about 6 months (around 03/23/2024) for Rheumatoid arthritis, Osteoarthritis, Osteoporosis.   Maya Nash, MD  Note - This record has been created  using Animal nutritionist.  Chart creation errors have been sought, but may not always  have been located. Such creation errors do not reflect on  the standard of medical care.

## 2023-09-18 ENCOUNTER — Other Ambulatory Visit: Payer: Self-pay | Admitting: Cardiovascular Disease

## 2023-09-21 ENCOUNTER — Ambulatory Visit: Payer: Medicare Other | Attending: Rheumatology | Admitting: Rheumatology

## 2023-09-21 ENCOUNTER — Encounter: Payer: Self-pay | Admitting: Rheumatology

## 2023-09-21 VITALS — BP 132/84 | HR 68 | Resp 16 | Ht 67.0 in | Wt 157.2 lb

## 2023-09-21 DIAGNOSIS — M25562 Pain in left knee: Secondary | ICD-10-CM | POA: Diagnosis present

## 2023-09-21 DIAGNOSIS — M5030 Other cervical disc degeneration, unspecified cervical region: Secondary | ICD-10-CM | POA: Insufficient documentation

## 2023-09-21 DIAGNOSIS — M810 Age-related osteoporosis without current pathological fracture: Secondary | ICD-10-CM | POA: Insufficient documentation

## 2023-09-21 DIAGNOSIS — G8929 Other chronic pain: Secondary | ICD-10-CM | POA: Insufficient documentation

## 2023-09-21 DIAGNOSIS — M51369 Other intervertebral disc degeneration, lumbar region without mention of lumbar back pain or lower extremity pain: Secondary | ICD-10-CM | POA: Diagnosis present

## 2023-09-21 DIAGNOSIS — E782 Mixed hyperlipidemia: Secondary | ICD-10-CM | POA: Insufficient documentation

## 2023-09-21 DIAGNOSIS — E559 Vitamin D deficiency, unspecified: Secondary | ICD-10-CM | POA: Insufficient documentation

## 2023-09-21 DIAGNOSIS — M170 Bilateral primary osteoarthritis of knee: Secondary | ICD-10-CM | POA: Insufficient documentation

## 2023-09-21 DIAGNOSIS — M25561 Pain in right knee: Secondary | ICD-10-CM | POA: Insufficient documentation

## 2023-09-21 DIAGNOSIS — M0609 Rheumatoid arthritis without rheumatoid factor, multiple sites: Secondary | ICD-10-CM | POA: Diagnosis not present

## 2023-09-21 DIAGNOSIS — H2013 Chronic iridocyclitis, bilateral: Secondary | ICD-10-CM | POA: Diagnosis not present

## 2023-09-21 DIAGNOSIS — E042 Nontoxic multinodular goiter: Secondary | ICD-10-CM | POA: Insufficient documentation

## 2023-09-21 DIAGNOSIS — D472 Monoclonal gammopathy: Secondary | ICD-10-CM | POA: Insufficient documentation

## 2023-09-21 MED ORDER — LIDOCAINE HCL 1 % IJ SOLN
1.5000 mL | INTRAMUSCULAR | Status: AC | PRN
  Administered 2023-09-21 (×2): 1.5 mL

## 2023-09-21 MED ORDER — TRIAMCINOLONE ACETONIDE 40 MG/ML IJ SUSP
40.0000 mg | INTRAMUSCULAR | Status: AC | PRN
  Administered 2023-09-21 (×2): 40 mg via INTRA_ARTICULAR

## 2023-09-27 ENCOUNTER — Other Ambulatory Visit: Payer: Self-pay | Admitting: Cardiovascular Disease

## 2023-10-05 ENCOUNTER — Other Ambulatory Visit: Payer: Self-pay | Admitting: Internal Medicine

## 2023-10-05 ENCOUNTER — Other Ambulatory Visit: Payer: Self-pay | Admitting: Cardiovascular Disease

## 2023-10-05 DIAGNOSIS — F419 Anxiety disorder, unspecified: Secondary | ICD-10-CM

## 2023-10-05 DIAGNOSIS — E785 Hyperlipidemia, unspecified: Secondary | ICD-10-CM

## 2023-10-05 DIAGNOSIS — I2510 Atherosclerotic heart disease of native coronary artery without angina pectoris: Secondary | ICD-10-CM

## 2023-10-05 DIAGNOSIS — E7849 Other hyperlipidemia: Secondary | ICD-10-CM

## 2023-10-18 ENCOUNTER — Encounter (HOSPITAL_BASED_OUTPATIENT_CLINIC_OR_DEPARTMENT_OTHER): Payer: Self-pay | Admitting: Family

## 2023-10-18 ENCOUNTER — Ambulatory Visit (INDEPENDENT_AMBULATORY_CARE_PROVIDER_SITE_OTHER): Admitting: Family

## 2023-10-18 VITALS — BP 128/82 | HR 60 | Ht 67.0 in | Wt 156.9 lb

## 2023-10-18 DIAGNOSIS — R002 Palpitations: Secondary | ICD-10-CM | POA: Diagnosis not present

## 2023-10-18 DIAGNOSIS — G720 Drug-induced myopathy: Secondary | ICD-10-CM

## 2023-10-18 DIAGNOSIS — T466X5D Adverse effect of antihyperlipidemic and antiarteriosclerotic drugs, subsequent encounter: Secondary | ICD-10-CM

## 2023-10-18 DIAGNOSIS — I2510 Atherosclerotic heart disease of native coronary artery without angina pectoris: Secondary | ICD-10-CM | POA: Diagnosis not present

## 2023-10-18 DIAGNOSIS — E7849 Other hyperlipidemia: Secondary | ICD-10-CM

## 2023-10-18 DIAGNOSIS — E785 Hyperlipidemia, unspecified: Secondary | ICD-10-CM

## 2023-10-18 MED ORDER — METOPROLOL SUCCINATE ER 25 MG PO TB24: 25.0000 mg | ORAL_TABLET | Freq: Every day | ORAL | 3 refills | Status: AC

## 2023-10-18 NOTE — Progress Notes (Signed)
 Cardiology Office Note   Date:  10/18/2023  ID:  Vickie Pearson, DOB [birth year], MRN [id number] PCP: Vickie Barnie NOVAK, MD  [facility] Providers Cardiologist:  Vickie Scarce, MD     History of Present Illness Vickie Pearson is a 68 y.o. female with a hx of palpitations, inappropriate sinus tachycardia, nonobstructive coronary artery disease, familial hyperlipidemia last seen 07/2019 by Dr. Scarce.   She previously wore heart monitor due to palpitations. Noted to be tachycardic with rate in the 140s at rest though no significant arrhythmia. She had coronary CTA 02/2019 with minimal obstruction, calcum score 50.3 which is 80th percentile. Her LDL was 251. She was started on Repatha . She has been intolerant to multiple statins. Metoprolol  previously up titrated due to palpitations. She contacted the office 03/2020 noting bradycardia and Toprol  was reduced to 25mg  QD.    At visit 05/2021 was noted she had stopped Zetia  due to lower extremity cramping.  Her lipids remained uncontrolled on Repatha .  Nexletol  180 mg daily was added.  She was last seen 06/25/2022 with intermittent, infrequent chest pain with most recent episode following upper body workout which she did not usually do.  No chest pain during exercise.  Chest pain was atypical and no ischemic evaluation recommended.  She noted not taking Nexletol  and declined, recommended to follow-up with Dr. Mona for additional recommendations.  She presents today for follow up. Feeling overall well since last seen. Reports occasional, rare chest pain that is overall similar to previous. Enjoys line dancing for exercise. No chest pain nor exertional dyspnea with exercise. Notes palpitations in the morning prior to her dose of Toprol . She is adding water in the morning to help with this. Reports occasioanl dizziness described as a spinning. She did just buy some Meclizine to trial. Endorses following a low sodium diet. Monitors  HR with Apple Watch, discussed normal for heart rate to drop to 40s when sleeping.   ROS: Please see the history of present illness.    All other systems reviewed and are negative.   Studies Reviewed EKG Interpretation Date/Time:  Monday October 18 2023 10:23:51 EDT Ventricular Rate:  60 PR Interval:  150 QRS Duration:  72 QT Interval:  384 QTC Calculation: 384 R Axis:   9  Text Interpretation: Normal sinus rhythm Low voltage QRS Confirmed by Vickie Pearson ([id number]) on 10/18/2023 10:37:29 AM    Cardiac Studies & Procedures   ______________________________________________________________________________________________        MONITORS  CARDIAC EVENT MONITOR 01/31/2019  Narrative 7 Day Event Monitor  Quality: Fair.  Baseline artifact. Predominant rhythm: sinus rhythm Average heart rate: 76 bpm Max heart rate: 149 bpm Min heart rate: 54 bpm Pauses >2.5 seconds: none  No arrhythmias.  Vickie C. Scarce, MD, [facility] 02/21/2019 5:51 PM   CT SCANS  CT CORONARY MORPH W/CTA COR W/SCORE 02/16/2019  Addendum 02/16/2019  4:49 PM ADDENDUM REPORT: 02/16/2019 16:47  CLINICAL DATA:  43F with hyperlipidemia, RA, chest pain and palpitations.  EXAM: Cardiac/Coronary  CT  TECHNIQUE: The patient was scanned on a [employer].  FINDINGS: A 120 kV prospective scan was triggered in the descending thoracic aorta at 111 HU's. Axial non-contrast 3 mm slices were carried out through the heart. The data set was analyzed on a dedicated work station and scored using the Agatson method. Gantry rotation speed was 250 msecs and collimation was .6 mm. No beta blockade and 0.8 mg of sl NTG was given. The 3D data set was reconstructed  in 5% intervals of the 67-82 % of the R-R cycle. Diastolic phases were analyzed on a dedicated work station using MPR, MIP and VRT modes. The patient received 80 cc of contrast.  Aorta: Normal size. Ascending aorta 3.2 cm. Mild calcification  of the aortic root and minimal calcification in the descending aorta. No dissection.  Aortic Valve:  Trileaflet.  No calcifications.  Coronary Arteries:  Normal coronary origin.  Right dominance.  RCA is a large dominant artery that gives rise to PDA and PLVB. There is no plaque.  Left main is a large artery that gives rise to LAD and LCX arteries. There is minimal (<25%) calcified plaque.  LAD is a large vessel that has minimal (<25%) calcified plaque proximally. There are two diagonals with no obstruction.  LCX is a non-dominant artery that gives rise to one large OM1 branch. There is minimal calcified plaque proximally. There is no plaque in OM1, OM2 or OM3.  Other findings:  Normal pulmonary vein drainage into the left atrium.  Normal let atrial appendage without a thrombus.  Normal size of the pulmonary artery.  IMPRESSION: 1. Coronary calcium score of 50.3. This was 80th percentile for age and sex matched control.  2. Normal coronary origin with right dominance.  3. No evidence of obstructive CAD.  4.  Recommend aggressive risk factor modification.  Vickie Scarce, MD   Electronically Signed By: Vickie Pearson On: 02/16/2019 16:47  Narrative EXAM: OVER-READ INTERPRETATION  CT CHEST  The following report is an over-read performed by radiologist Dr. Franky Crease of [facility] on 02/16/2019. This over-read does not include interpretation of cardiac or coronary anatomy or pathology. The coronary CTA interpretation by the cardiologist is attached.  COMPARISON:  None.  FINDINGS: Vascular: Heart is normal size.  Aorta is normal caliber.  Mediastinum/Nodes: No adenopathy in the lower mediastinum or hila.  Lungs/Pleura: Visualized lungs clear.  No effusions.  Upper Abdomen: Imaging into the upper abdomen shows no acute findings.  Musculoskeletal: Chest wall soft tissues are unremarkable. No acute bony abnormality.  IMPRESSION: No  acute or significant extra cardiac abnormality.  Electronically Signed: By: Franky Crease M.D. On: 02/16/2019 13:49     ______________________________________________________________________________________________      Risk Assessment/Calculations           Physical Exam VS:  BP 132/86   Pulse 60   Ht 5' 7 (1.702 m)   Wt 156 lb 14.4 oz (71.2 kg)   SpO2 98%   BMI 24.57 kg/m        Wt Readings from Last 3 Encounters:  10/18/23 156 lb 14.4 oz (71.2 kg)  09/21/23 157 lb 3.2 oz (71.3 kg)  03/23/23 160 lb (72.6 kg)    GEN: Well nourished, well developed in no acute distress NECK: No JVD; No carotid bruits CARDIAC: RRR, no murmurs, rubs, gallops RESPIRATORY:  Clear to auscultation without rales, wheezing or rhonchi  ABDOMEN: Soft, non-tender, non-distended EXTREMITIES:  No edema; No deformity   ASSESSMENT AND PLAN  Palpitations / Inappropriate sinus tachycardia -Notes AM palpitations prior to toprol . Hesitant to increase dose due to relative bradycardia. Encouraged to hydrate in the morning, limit caffeine. Discussed if palpitations persistently bothersome could trial Toprol  12.5mg  BID instead of 25mg  daily. Refill of Toprol  25mg  daily provided.    Nonobstructive CAD - Stable with no anginal symptoms. No indication for ischemic evaluation.  EKG today no acute St/T wave changes.  GDMT includes metoprolol  25mg  daily, Repatha  140mg  q14 weeks. Recommend aiming  for 150 minutes of moderate intensity activity per week and following a heart healthy diet.     HLD, LDL goal <70 / statin myopathy / Familial hyperlipidemia -Previous intolerance to statin with myalgias. Notes recent missed dose of Repatha . Repeat fasting lipid panel mid October. To reassess LDL after 4 continuous doses of Repatha .        Dispo: follow up in 1 year  Signed, Reche GORMAN Finder, NP

## 2023-10-18 NOTE — Patient Instructions (Signed)
 Medication Instructions:  Continue your current medications.   *If you need a refill on your cardiac medications before your next appointment, please call your pharmacy*  Lab Work: Your physician recommends that you return for lab work in the middle of October for fasting lipid panel  If you have labs (blood work) drawn today and your tests are completely normal, you will receive your results only by: MyChart Message (if you have MyChart) OR A paper copy in the mail If you have any lab test that is abnormal or we need to change your treatment, we will call you to review the results.  Testing/Procedures: Your EKG looks good!  Follow-Up: At [facility], you and your health needs are our priority.  As part of our continuing mission to provide you with exceptional heart care, our providers are all part of one team.  This team includes your primary Cardiologist (physician) and Advanced Practice Providers or APPs (Physician Assistants and Nurse Practitioners) who all work together to provide you with the care you need, when you need it.  Your next appointment:   1 year(s)  Provider:   Annabella Scarce, MD, Rosaline Bane, NP, or Reche Finder, NP    We recommend signing up for the patient portal called MyChart.  Sign up information is provided on this After Visit Summary.  MyChart is used to connect with patients for Virtual Visits (Telemedicine).  Patients are able to view lab/test results, encounter notes, upcoming appointments, etc.  Non-urgent messages can be sent to your provider as well.   To learn more about what you can do with MyChart, go to [email or web address].   Other Instructions Heart Healthy Diet Recommendations: A low-salt diet is recommended. Meats should be grilled, baked, or boiled. Avoid fried foods. Focus on lean protein sources like fish or chicken with vegetables and fruits. The American Heart Association is a Chief Technology Officer!  American Heart  Association Diet and Lifeystyle Recommendations   Exercise recommendations: The American Heart Association recommends 150 minutes of moderate intensity exercise weekly. Try 30 minutes of moderate intensity exercise 4-5 times per week. This could include walking, jogging, or swimming.

## 2023-11-02 ENCOUNTER — Other Ambulatory Visit: Payer: Self-pay | Admitting: Cardiovascular Disease

## 2023-11-02 DIAGNOSIS — I2510 Atherosclerotic heart disease of native coronary artery without angina pectoris: Secondary | ICD-10-CM

## 2023-11-02 DIAGNOSIS — E785 Hyperlipidemia, unspecified: Secondary | ICD-10-CM

## 2023-11-02 DIAGNOSIS — E7849 Other hyperlipidemia: Secondary | ICD-10-CM

## 2023-11-09 ENCOUNTER — Other Ambulatory Visit: Payer: Self-pay | Admitting: Physician Assistant

## 2023-11-09 DIAGNOSIS — K219 Gastro-esophageal reflux disease without esophagitis: Secondary | ICD-10-CM

## 2023-11-23 NOTE — Progress Notes (Unsigned)
 Patient ID: Vickie Pearson, female   DOB: 03-30-1955, 68 y.o.   MRN: [id number]  HPI  Vickie Pearson is a 68 y.o.-year-old female, returning for follow-up for osteoporosis (OP) and multinodular goiter.  She previously saw Dr. Kassie, last visit 1 year ago.  Interim history: No falls or fractures since last visit.  No dizziness or vertigo. No weightbearing exercise. She does dancing and balance exercises. She had a persistent cough >> she was restarted on omeprazole  since last visit.  OP: - dx'ed  in 2018  I reviewed pt's DXA scans: Date L1-L4 T score FN T score 33% distal Radius Ultra distal radius  [facility] N/a RFN: -2.6 LFN: -3.1 -1.1 -3.4  07/07/2019 ([facility]) N/a RFN: -2.8 LFN: -3.0 -1.9 -0.8  04/29/2012 ([facility]) -0.6 RFN: N/a LFN: -2.3 N/a N/a   She has no fracture history.  Previous OP treatments:  - Reclast : 2018 (1 dose), 11/13/2020, 12/22/2021, 01/19/2023 - no jaw/hip/thigh pain.  Vitamin D  insufficiency:  Reviewed available vit D levels: Lab Results  Component Value Date   VD25OH 41.65 11/25/2022   VD25OH 51.35 11/18/2021   VD25OH 43.47 10/02/2020   VD25OH 51.84 09/27/2019   VD25OH 45 01/25/2017   VD25OH 27 (L) 10/02/2016   VD25OH 35.7 07/24/2016   Pt is on: - vitamin D  3000 units daily.  No weight bearing exercises. She does chair yoga, line dancing.  She does not take high vitamin A doses.  She has a history RA and was on prednisone  between 2015-2018.   She also had steroid injections in the knees.  She has a history of MGUS.  She smoked between 1979 and 1989.  Menopause was at 25s y/o.   No FH of osteoporosis.  No h/o hyper/hypocalcemia or hyperparathyroidism. No h/o kidney stones. Lab Results  Component Value Date   PTH 64 10/02/2020   PTH 35 09/27/2019   CALCIUM 9.5 02/22/2023   CALCIUM 10.1 11/25/2022   CALCIUM 9.7 02/20/2022   CALCIUM 9.9 11/18/2021   CALCIUM 9.3 02/20/2021    CALCIUM 9.7 10/02/2020   CALCIUM 9.7 10/02/2020   CALCIUM 10.1 09/30/2020   CALCIUM 9.9 07/02/2020   CALCIUM 9.7 02/21/2020   No h/o thyrotoxicosis. Reviewed TSH recent levels:  Lab Results  Component Value Date   TSH 0.93 10/02/2020   TSH 1.51 09/27/2019   TSH 0.935 11/24/2018   No h/o CKD. Last BUN/Cr: Lab Results  Component Value Date   BUN 10 02/22/2023   CREATININE 0.82 02/22/2023   MNG: - euthyroid - dx'ed 2019  Thyroid  ultrasound (10/09/2020): Thyroid  nodules were low risk and no follow-up was needed: Parenchymal Echotexture: Mildly heterogenous Isthmus: 0.3 cm Right lobe: 4.6 x 1.2 x 2.1 cm Left lobe: 5.1 x 1.6 x 1.9 cm  Estimated total number of nodules >/= 1 cm: 3   Multiple small spongiform and mixed cystic/solid nodules with isoechoic solid component and peripheral colloid artifact in the left mid and lower gland.   None of these nodules meet criteria to warrant biopsy or dedicated imaging follow-up.   No new nodules or suspicious features identified.   IMPRESSION: Sonographically benign/low risk spongiform nodules and mixed cystic and solid nodules in the left mid and lower gland. None of the lesions meets criteria to warrant biopsy or further imaging follow-up.  Pt denies: - feeling nodules in neck - hoarseness - dysphagia - choking  ROS: + see HPI  I reviewed pt's medications, allergies, PMH, social hx, family hx, and changes  were documented in the history of present illness. Otherwise, unchanged from my initial visit note.  Past Medical History:  Diagnosis Date   Anxiety 08/27/20   Colon polyps    Coronary artery calcification of native artery 02/21/2019   Minimal calcification noted 11/2018 on coronary CTA   Costochondritis    DDD (degenerative disc disease), cervical    Degenerative arthritis of knee    Bilateral   Degenerative disc disease, cervical    Degenerative disc disease, cervical    Depression 08/27/20   Fibromyalgia    GERD  (gastroesophageal reflux disease)    Hyperlipidemia    Inappropriate sinus tachycardia 02/21/2019   Monoclonal gammopathy    Multiple thyroid  nodules    Osteoporosis    Rheumatoid arthritis [facility])    Past Surgical History:  Procedure Laterality Date   BREAST BIOPSY Right 2022   COLONOSCOPY  2012   COLONOSCOPY  2015   POLYPECTOMY     Social History   Socioeconomic History   Marital status: Widowed    Spouse name: Not on file   Number of children: Not on file   Years of education: Not on file   Highest education level: Bachelor's degree (e.g., BA, AB, BS)  Occupational History   Not on file  Tobacco Use   Smoking status: Former    Current packs/day: 0.00    Types: Cigarettes    Quit date: 07/24/1988    Years since quitting: 35.3    Passive exposure: Never   Smokeless tobacco: Never  Vaping Use   Vaping status: Never Used  Substance and Sexual Activity   Alcohol use: Not Currently   Drug use: No   Sexual activity: Not Currently    Birth control/protection: Abstinence  Other Topics Concern   Not on file  Social History Narrative   Not on file   Social Drivers of Health   Financial Resource Strain: Low Risk  (08/27/2023)   Overall Financial Resource Strain (CARDIA)    Difficulty of Paying Living Expenses: Not very hard  Food Insecurity: No Food Insecurity (08/27/2023)   Hunger Vital Sign    Worried About Running Out of Food in the Last Year: Never true    Ran Out of Food in the Last Year: Never true  Transportation Needs: No Transportation Needs (08/27/2023)   PRAPARE - Administrator, Civil Service (Medical): No    Lack of Transportation (Non-Medical): No  Physical Activity: Sufficiently Active (08/27/2023)   Exercise Vital Sign    Days of Exercise per Week: 4 days    Minutes of Exercise per Session: 60 min  Stress: Stress Concern Present (08/27/2023)   [employer] of Occupational Health - Occupational Stress Questionnaire    Feeling of Stress: To  some extent  Social Connections: Moderately Integrated (08/27/2023)   Social Connection and Isolation Panel    Frequency of Communication with Friends and Family: More than three times a week    Frequency of Social Gatherings with Friends and Family: Three times a week    Attends Religious Services: More than 4 times per year    Active Member of Clubs or Organizations: Yes    Attends Banker Meetings: 1 to 4 times per year    Marital Status: Widowed  Intimate Partner Violence: Not At Risk (08/31/2023)   Humiliation, Afraid, Rape, and Kick questionnaire    Fear of Current or Ex-Partner: No    Emotionally Abused: No    Physically Abused: No  Sexually Abused: No   Current Outpatient Medications on File Prior to Visit  Medication Sig Dispense Refill   cholecalciferol (VITAMIN D ) 1000 units tablet Take 3,000 Units by mouth daily.     Evolocumab  (REPATHA  SURECLICK) 140 MG/ML SOAJ INJECT 140 MG INTO THE SKIN EVERY 14 DAYS 6 mL 3   fluticasone (FLONASE) 50 MCG/ACT nasal spray Place 1 spray into both nostrils as needed for allergies or rhinitis.     hydrOXYzine  (VISTARIL ) 25 MG capsule Take 1 capsule (25 mg total) by mouth 2 (two) times daily as needed for anxiety. Please make PCP appt. 30 capsule 0   Lactobacillus (PROBIOTIC ACIDOPHILUS PO) Take 1 capsule by mouth daily.     Magnesium Citrate 100 MG TABS Take by mouth daily as needed.     metoprolol  succinate (TOPROL -XL) 25 MG 24 hr tablet Take 1 tablet (25 mg total) by mouth daily. 90 tablet 3   omeprazole  (PRILOSEC) 20 MG capsule Take 1 capsule (20 mg total) by mouth daily. Take at least 3 weeks to see if cough improves 30 capsule 3   vitamin B-12 (CYANOCOBALAMIN) 1000 MCG tablet Take 1 tablet by mouth daily.     zinc sulfate 220 (50 Zn) MG capsule Take by mouth daily.      Zoledronic  Acid (RECLAST  IV)      No current facility-administered medications on file prior to visit.   Allergies  Allergen Reactions   Penicillins Hives     Has patient had a PCN reaction causing immediate rash, facial/tongue/throat swelling, SOB or lightheadedness with hypotension: No Has patient had a PCN reaction causing severe rash involving mucus membranes or skin necrosis: Yes Has patient had a PCN reaction that required hospitalization: No Has patient had a PCN reaction occurring within the last 10 years: No If all of the above answers are NO, then may proceed with Cephalosporin use.    Piroxicam Hives   Statins    Sulfa Antibiotics Hives   Zetia  [Ezetimibe ]     Muscle cramps   Family History  Problem Relation Age of Onset   Hypertension Mother    Colon polyps Mother    Stroke Mother    Arthritis Mother    Hypertension Father    Heart disease Father    Arthritis Father    Kidney disease Father    Hypertension Sister    Hyperlipidemia Sister    Hypertension Brother    Hyperlipidemia Brother    Esophageal cancer Neg Hx    Rectal cancer Neg Hx    Stomach cancer Neg Hx    Osteoporosis Neg Hx    PE: There were no vitals taken for this visit. Wt Readings from Last 3 Encounters:  10/18/23 156 lb 14.4 oz (71.2 kg)  09/21/23 157 lb 3.2 oz (71.3 kg)  03/23/23 160 lb (72.6 kg)   Constitutional: Normal weight, in NAD. No kyphosis. Eyes: EOMI, no exophthalmos ENT: no thyromegaly, no cervical lymphadenopathy Cardiovascular: RRR, No MRG Respiratory: CTA B Musculoskeletal: no deformities Skin: no rashes Neurological: no tremor with outstretched hands  Assessment: 1. Osteoporosis  2.  Vitamin D  insufficiency 3. MNG  Plan: 1. Osteoporosis - Likely postmenopausal/age-related, but possibly also contributed to by her history of RA, steroid treatment, and MGUS -Reviewing the latest bone density scan from 05/14/2022, the T-scores were stable at the femoral necks, but slightly worse at the ultra distal radius (T-score -3.4).  We discussed that this is a site where limited penetrability by adding the osteoporotic drugs.  The  best way to increase the bone density at this site is actually weightbearing exercise.   - I previously gave her the National osteoporosis foundation recommendations about weightbearing exercises and advised her to do this every day or at least 5 out of 7 days - We reviewed fall precautions - At last visit, vitamin D  level was normal so we continued the same supplements.  We also discussed about getting 1200 mg of calcium preferentially from the diet.  We discussed that PPIs can reduce the absorption of calcium.  She was off at last visit but planning to restart. - She has a history of smoking, which can contribute to osteoporosis, however, she quit more than 30 years ago resulting -When I last saw her, she was started on Reclast , and she was tolerating it well.  She had 1 injection in 2018 and 1 in 2022.  Afterwards, she was able to obtain it in 2023 and 2024.  She tolerated well, without jaw/thigh/hip pain.  Will plan to continue for a total of approximately 5 years. - We will repeat her BMP today along with a vitamin D  level - She will be due for another bone density scan in 05/2024 - will see pt back in 1 year  2.  Vitamin D  insufficiency -She continues on 3000 units vitamin D  daily - Latest vitamin D  level was normal last year: Lab Results  Component Value Date   VD25OH 41.65 11/25/2022   VD25OH 51.35 11/18/2021   VD25OH 43.47 10/02/2020   VD25OH 51.84 09/27/2019   VD25OH 45 01/25/2017   VD25OH 27 (L) 10/02/2016   VD25OH 35.7 07/24/2016  -We will recheck this today  3. MNG - Euthyroid - No neck compression symptoms - Latest thyroid  ultrasound from 09/2020 was reassuring, with none of the thyroid  nodules needing interventional follow-up - Will continue to follow her expectantly for this  Lela Fendt, MD PhD [facility]

## 2023-11-24 ENCOUNTER — Ambulatory Visit (INDEPENDENT_AMBULATORY_CARE_PROVIDER_SITE_OTHER): Payer: Medicare Other | Admitting: Internal Medicine

## 2023-11-24 ENCOUNTER — Encounter: Payer: Self-pay | Admitting: Internal Medicine

## 2023-11-24 VITALS — BP 120/70 | HR 58 | Ht 67.0 in | Wt 155.8 lb

## 2023-11-24 DIAGNOSIS — E042 Nontoxic multinodular goiter: Secondary | ICD-10-CM

## 2023-11-24 DIAGNOSIS — M810 Age-related osteoporosis without current pathological fracture: Secondary | ICD-10-CM

## 2023-11-24 NOTE — Patient Instructions (Signed)
 Please stop at the lab.  Continue Vitamin D  3000 units.  Continue Reclast  for now.  Restart weightbearing exercises.  Please return for another endocrinology follow-up appointment in 1 year.

## 2023-11-25 ENCOUNTER — Telehealth: Payer: Self-pay | Admitting: Pharmacy Technician

## 2023-11-25 ENCOUNTER — Encounter: Payer: Self-pay | Admitting: Internal Medicine

## 2023-11-25 ENCOUNTER — Ambulatory Visit: Payer: Self-pay | Admitting: Internal Medicine

## 2023-11-25 LAB — BASIC METABOLIC PANEL WITHOUT GFR
BUN: 13 mg/dL (ref 7–25)
CO2: 26 mmol/L (ref 20–32)
Calcium: 9.9 mg/dL (ref 8.6–10.4)
Chloride: 105 mmol/L (ref 98–110)
Creat: 0.86 mg/dL (ref 0.50–1.05)
Glucose, Bld: 80 mg/dL (ref 65–99)
Potassium: 4.2 mmol/L (ref 3.5–5.3)
Sodium: 139 mmol/L (ref 135–146)

## 2023-11-25 LAB — VITAMIN D 25 HYDROXY (VIT D DEFICIENCY, FRACTURES): Vit D, 25-Hydroxy: 31 ng/mL (ref 30–100)

## 2023-11-25 NOTE — Telephone Encounter (Signed)
 Auth Submission: NO AUTH NEEDED Site of care: Site of care: [facility] Payer: MEDICARE A/B & ATENA SUPP Medication & CPT/J Code(s) submitted: Reclast  (Zolendronic acid) J3489 Diagnosis Code: M81.0 Route of submission (phone, fax, portal):  Phone # Fax # Auth type: Buy/Bill PB Units/visits requested: X1 DOSE Reference number:  Approval from: 11/25/23 to 03/11/24

## 2023-11-25 NOTE — Addendum Note (Signed)
 Addended by: TRIXIE FILE on: 11/25/2023 10:17 AM   Modules accepted: Orders

## 2023-12-06 ENCOUNTER — Other Ambulatory Visit: Payer: Self-pay | Admitting: Internal Medicine

## 2023-12-06 DIAGNOSIS — Z1231 Encounter for screening mammogram for malignant neoplasm of breast: Secondary | ICD-10-CM

## 2023-12-23 LAB — LIPID PANEL
Chol/HDL Ratio: 3.5 ratio (ref 0.0–4.4)
Cholesterol, Total: 225 mg/dL — ABNORMAL HIGH (ref 100–199)
HDL: 64 mg/dL (ref >39)
LDL Chol Calc (NIH): 143 mg/dL — ABNORMAL HIGH (ref 0–99)
Triglycerides: 101 mg/dL (ref 0–149)
VLDL Cholesterol Cal: 18 mg/dL (ref 5–40)

## 2023-12-24 ENCOUNTER — Ambulatory Visit (HOSPITAL_BASED_OUTPATIENT_CLINIC_OR_DEPARTMENT_OTHER): Payer: Self-pay | Admitting: Family

## 2023-12-24 DIAGNOSIS — M0609 Rheumatoid arthritis without rheumatoid factor, multiple sites: Secondary | ICD-10-CM

## 2023-12-24 DIAGNOSIS — E7849 Other hyperlipidemia: Secondary | ICD-10-CM

## 2023-12-24 DIAGNOSIS — G720 Drug-induced myopathy: Secondary | ICD-10-CM

## 2023-12-24 DIAGNOSIS — I2510 Atherosclerotic heart disease of native coronary artery without angina pectoris: Secondary | ICD-10-CM

## 2023-12-24 DIAGNOSIS — E785 Hyperlipidemia, unspecified: Secondary | ICD-10-CM

## 2023-12-24 NOTE — Telephone Encounter (Signed)
 The patient has been notified of the result and verbalized understanding.  All questions (if any) were answered.  Pt is interested in being referred to Dr. [facility]a [facility] to discuss other treatment options.  She is very hesitant, due to her history of RA.   Pt aware I will place the referral in the system and have his scheduler reach out to her sometime next week to coordinate this appt.   Pt verbalized understanding and agrees with this plan.

## 2023-12-24 NOTE — Telephone Encounter (Signed)
-----   Message from Reche GORMAN Finder sent at 12/24/2023  1:18 PM EST ----- LDL (bad cholesterol)  remains elevated. Please ensure taking Repatha  every 14 days as prescribed.  Did not tolerate statin or zetia .She was previously prescribed Nexletol  but not certain she ever started. Recommend Nexletol  180mg  daily with repeat FLP in 2 months.   If available, can provide 2 week sample.   If declines med changes, refer to Dr. Mona in lipid clinic  ----- Message ----- From: Interface, [facility] Lab Results In Sent: 12/23/2023  11:36 PM EST To: Reche GORMAN Finder, NP

## 2024-01-04 ENCOUNTER — Ambulatory Visit
Admission: RE | Admit: 2024-01-04 | Discharge: 2024-01-04 | Disposition: A | Discharge status: Home / Self Care | Source: Ambulatory Visit | Attending: Internal Medicine | Admitting: Internal Medicine

## 2024-01-04 DIAGNOSIS — Z1231 Encounter for screening mammogram for malignant neoplasm of breast: Secondary | ICD-10-CM

## 2024-01-10 ENCOUNTER — Ambulatory Visit: Payer: Self-pay | Admitting: Internal Medicine

## 2024-01-20 ENCOUNTER — Ambulatory Visit (INDEPENDENT_AMBULATORY_CARE_PROVIDER_SITE_OTHER)

## 2024-01-20 DIAGNOSIS — M810 Age-related osteoporosis without current pathological fracture: Secondary | ICD-10-CM

## 2024-01-20 MED ORDER — ACETAMINOPHEN 325 MG PO TABS
650.0000 mg | ORAL_TABLET | Freq: Once | ORAL | Status: AC
  Administered 2024-01-20: 650 mg via ORAL
  Filled 2024-01-20: qty 2

## 2024-01-20 MED ORDER — DIPHENHYDRAMINE HCL 25 MG PO CAPS
25.0000 mg | ORAL_CAPSULE | Freq: Once | ORAL | Status: AC
  Administered 2024-01-20: 25 mg via ORAL
  Filled 2024-01-20: qty 1

## 2024-01-20 MED ORDER — ZOLEDRONIC ACID 5 MG/100ML IV SOLN
5.0000 mg | Freq: Once | INTRAVENOUS | Status: AC
  Administered 2024-01-20: 5 mg via INTRAVENOUS
  Filled 2024-01-20: qty 100

## 2024-01-20 NOTE — Progress Notes (Signed)
 Diagnosis: Osteoporosis  Provider:  Lonna Coder MD  Procedure: IV Infusion  IV Type: Peripheral, IV Location: L Antecubital   Reclast  (Zolendronic Acid), Dose: 5 mg  Infusion Start Time: 1033  Infusion Stop Time: 1105  Post Infusion IV Care: Observation period completed and Peripheral IV Discontinued  Discharge: Condition: Good, Destination: Home . AVS Declined  Performed by:  Leita FORBES Miles, LPN

## 2024-01-27 ENCOUNTER — Ambulatory Visit (INDEPENDENT_AMBULATORY_CARE_PROVIDER_SITE_OTHER): Admitting: Internal Medicine

## 2024-01-27 ENCOUNTER — Encounter (HOSPITAL_BASED_OUTPATIENT_CLINIC_OR_DEPARTMENT_OTHER): Payer: Self-pay | Admitting: Internal Medicine

## 2024-01-27 VITALS — BP 122/80 | HR 67 | Ht 66.0 in | Wt 155.7 lb

## 2024-01-27 DIAGNOSIS — T466X5D Adverse effect of antihyperlipidemic and antiarteriosclerotic drugs, subsequent encounter: Secondary | ICD-10-CM

## 2024-01-27 DIAGNOSIS — G720 Drug-induced myopathy: Secondary | ICD-10-CM

## 2024-01-27 DIAGNOSIS — E785 Hyperlipidemia, unspecified: Secondary | ICD-10-CM | POA: Diagnosis not present

## 2024-01-27 DIAGNOSIS — E7849 Other hyperlipidemia: Secondary | ICD-10-CM | POA: Diagnosis not present

## 2024-01-27 DIAGNOSIS — I2510 Atherosclerotic heart disease of native coronary artery without angina pectoris: Secondary | ICD-10-CM

## 2024-01-27 DIAGNOSIS — I2584 Coronary atherosclerosis due to calcified coronary lesion: Secondary | ICD-10-CM | POA: Diagnosis not present

## 2024-01-27 MED ORDER — EZETIMIBE 10 MG PO TABS: 10.0000 mg | ORAL_TABLET | Freq: Every day | ORAL | 3 refills | Status: AC

## 2024-01-27 NOTE — Patient Instructions (Signed)
 Medication Instructions:   START zetia  10mg  once daily for cholesterol    *If you need a refill on your cardiac medications before your next appointment, please call your pharmacy*  Lab Work:  FASTING lab work in 3-4 months   If you have labs (blood work) drawn today and your tests are completely normal, you will receive your results only by: [employer] (if you have MyChart) OR A paper copy in the mail If you have any lab test that is abnormal or we need to change your treatment, we will call you to review the results.  Follow-Up: At [facility], you and your health needs are our priority.  As part of our continuing mission to provide you with exceptional heart care, our providers are all part of one team.  This team includes your primary Cardiologist (physician) and Advanced Practice Providers or APPs (Physician Assistants and Nurse Practitioners) who all work together to provide you with the care you need, when you need it.  Your next appointment:    AS SCHEDULED/PER RECALL with Dr. Caleb Finder NP   AS NEEDED with Dr. Mona  We recommend signing up for the patient portal called MyChart.  Sign up information is provided on this After Visit Summary.  MyChart is used to connect with patients for Virtual Visits (Telemedicine).  Patients are able to view lab/test results, encounter notes, upcoming appointments, etc.  Non-urgent messages can be sent to your provider as well.   To learn more about what you can do with MyChart, go to [email or web address].   Other Instructions

## 2024-01-27 NOTE — Progress Notes (Signed)
 LIPID CLINIC CONSULT NOTE  Chief Complaint:  Manage dyslipidemia  Primary Care Physician: Vickie Barnie NOVAK, MD  Primary Cardiologist:  Vickie Scarce, MD  HPI:  Vickie Pearson is a 68 y.o. female who is being seen today for the evaluation of dyslipidemia at the request of Vickie Reche RAMAN, NP.  [name], NP.  She was referred for evaluation and management of dyslipidemia.  She has previously had a history of high cholesterol and has been on Repatha  due to intolerance of statins.  Her most recent lipid profile shows persistently elevated cholesterol in November with total 225, triglycerides 101, HDL 64 and LDL 143.  She does report compliance with the medication.  She had previously not been able to tolerate ezetimibe  as well apparently caused some muscle cramps.  We discussed options such as Nexletol  however there are few additional options at this point.  She had previously had a mildly elevated LP(a) as well at 92.  PMHx:  Past Medical History:  Diagnosis Date   Anxiety 08/27/20   Colon polyps    Coronary artery calcification of native artery 02/21/2019   Minimal calcification noted 11/2018 on coronary CTA   Costochondritis    DDD (degenerative disc disease), cervical    Degenerative arthritis of knee    Bilateral   Degenerative disc disease, cervical    Degenerative disc disease, cervical    Depression 08/27/20   Fibromyalgia    GERD (gastroesophageal reflux disease)    Hyperlipidemia    Inappropriate sinus tachycardia 02/21/2019   Monoclonal gammopathy    Multiple thyroid  nodules    Osteoporosis    Rheumatoid arthritis (HCC)     Past Surgical History:  Procedure Laterality Date   BREAST BIOPSY Right 2022   COLONOSCOPY  2012   COLONOSCOPY  2015   POLYPECTOMY      FAMHx:  Family History  Problem Relation Age of Onset   Hypertension Mother    Colon polyps Mother    Stroke Mother     Arthritis Mother    Hypertension Father    Heart disease Father    Arthritis Father    Kidney disease Father    Hypertension Sister    Hyperlipidemia Sister    Hypertension Brother    Hyperlipidemia Brother    Esophageal cancer Neg Hx    Rectal cancer Neg Hx    Stomach cancer Neg Hx    Osteoporosis Neg Hx    Breast cancer Neg Hx     SOCHx:   reports that she quit smoking about 35 years ago. Her smoking use included cigarettes. She smoked an average of 1 pack per day. She has never been exposed to tobacco smoke. She has never used smokeless tobacco. She reports that she does not currently use alcohol. She reports that she does not use drugs.  ALLERGIES:  Allergies[1]  ROS: Pertinent items noted in HPI and remainder of comprehensive ROS otherwise negative.  HOME MEDS: Medications Ordered Prior to Encounter[2]  LABS/IMAGING: No results found for this or any previous visit (from the past 48 hours). No results found.  LIPID PANEL:    Component Value Date/Time   CHOL 225 (H) 12/23/2023 0927   TRIG 101 12/23/2023 0927   HDL 64 12/23/2023 0927   CHOLHDL 3.5 12/23/2023 0927   LDLCALC 143 (H) 12/23/2023 0927    Lipoprotein (a)  Date/Time Value Ref Range Status  09/22/2022 09:58 AM 92.1 (H) <75.0  nmol/L Final    Comment:    Note:  Values greater than or equal to 75.0 nmol/L may        indicate an independent risk factor for CHD,        but must be evaluated with caution when applied        to non-Caucasian populations due to the        influence of genetic factors on Lp(a) across        ethnicities.      WEIGHTS: Wt Readings from Last 3 Encounters:  01/27/24 155 lb 11.2 oz (70.6 kg)  01/20/24 152 lb 6.4 oz (69.1 kg)  11/24/23 155 lb 12.8 oz (70.7 kg)    VITALS: BP 122/80   Pulse 67   Ht 5' 6 (1.676 m)   Wt 155 lb 11.2 oz (70.6 kg)   SpO2 95%   BMI 25.13 kg/m   EXAM: Deferred  EKG: Deferred  ASSESSMENT: Dyslipidemia, goal LDL less than 70 Elevated  LP(a)-92 nmol/L Statin intolerant CAC score 50.3, 80th percentile with mild nonobstructive coronary disease (02/2019)  PLAN: 1.   Vickie Pearson is on Repatha  but still has persistently elevated LDL cholesterol suggesting a possible familial hyperlipidemia.  Her LP(a) is mildly elevated.  She cannot tolerate statins.  We discussed alternatives.  She was hesitant to take Nexletol  due to side effects.  She did indicate she would try ezetimibe  again because she had some mild cramps but felt that it might be tolerable.  If this is tolerable and she still does not reach target would consider adding Nexletol  to that.  Plan repeat lipids in 3 to 4 months and follow-up with Vickie Pearson or Vickie Pearson.  Vickie KYM Maxcy, MD, [facility], FNLA, FACP  [place]  [facility]  Medical Director of the Advanced Lipid Disorders &  Cardiovascular Risk Reduction Clinic Diplomate of the American Board of Clinical Lipidology Attending Cardiologist  Direct Dial: [phone]  Fax: [phone]  Website:  [email or web address]  Vickie Pearson 01/27/2024, 10:58 AM      [1]  Allergies Allergen Reactions   Penicillins Hives    Has patient had a PCN reaction causing immediate rash, facial/tongue/throat swelling, SOB or lightheadedness with hypotension: No Has patient had a PCN reaction causing severe rash involving mucus membranes or skin necrosis: Yes Has patient had a PCN reaction that required hospitalization: No Has patient had a PCN reaction occurring within the last 10 years: No If all of the above answers are NO, then may proceed with Cephalosporin use.    Piroxicam Hives   Statins    Sulfa Antibiotics Hives   Zetia  [Ezetimibe ]     Muscle cramps  [2]  Current Outpatient Medications on File Prior to Visit  Medication Sig Dispense Refill   cholecalciferol (VITAMIN D ) 1000 units tablet Take 3,000 Units by mouth daily.     Evolocumab  (REPATHA  SURECLICK) 140 MG/ML SOAJ INJECT 140 MG INTO THE SKIN  EVERY 14 DAYS 6 mL 3   fluticasone (FLONASE) 50 MCG/ACT nasal spray Place 1 spray into both nostrils as needed for allergies or rhinitis.     hydrOXYzine  (VISTARIL ) 25 MG capsule Take 1 capsule (25 mg total) by mouth 2 (two) times daily as needed for anxiety. Please make PCP appt. 30 capsule 0   Lactobacillus (PROBIOTIC ACIDOPHILUS PO) Take 1 capsule by mouth daily.     Magnesium Citrate 100 MG TABS Take by mouth daily as needed.     metoprolol  succinate (TOPROL -XL) 25 MG 24  hr tablet Take 1 tablet (25 mg total) by mouth daily. 90 tablet 3   omeprazole  (PRILOSEC) 20 MG capsule Take 1 capsule (20 mg total) by mouth daily. Take at least 3 weeks to see if cough improves 30 capsule 3   vitamin B-12 (CYANOCOBALAMIN) 1000 MCG tablet Take 1 tablet by mouth daily.     zinc sulfate 220 (50 Zn) MG capsule Take by mouth daily.      Zoledronic  Acid (RECLAST  IV)      No current facility-administered medications on file prior to visit.

## 2024-02-29 ENCOUNTER — Inpatient Hospital Stay: Payer: Medicare Other | Attending: Hematology and Oncology

## 2024-02-29 ENCOUNTER — Other Ambulatory Visit: Payer: Self-pay

## 2024-02-29 DIAGNOSIS — D472 Monoclonal gammopathy: Secondary | ICD-10-CM

## 2024-02-29 DIAGNOSIS — Z79899 Other long term (current) drug therapy: Secondary | ICD-10-CM | POA: Insufficient documentation

## 2024-02-29 LAB — CBC WITH DIFFERENTIAL (CANCER CENTER ONLY)
Abs Immature Granulocytes: 0.01 K/uL (ref 0.00–0.07)
Basophils Absolute: 0 K/uL (ref 0.0–0.1)
Basophils Relative: 0 %
Eosinophils Absolute: 0.2 K/uL (ref 0.0–0.5)
Eosinophils Relative: 3 %
HCT: 41.8 % (ref 36.0–46.0)
Hemoglobin: 13.7 g/dL (ref 12.0–15.0)
Immature Granulocytes: 0 %
Lymphocytes Relative: 44 %
Lymphs Abs: 2.5 K/uL (ref 0.7–4.0)
MCH: 31.1 pg (ref 26.0–34.0)
MCHC: 32.8 g/dL (ref 30.0–36.0)
MCV: 94.8 fL (ref 80.0–100.0)
Monocytes Absolute: 0.4 K/uL (ref 0.1–1.0)
Monocytes Relative: 7 %
Neutro Abs: 2.6 K/uL (ref 1.7–7.7)
Neutrophils Relative %: 46 %
Platelet Count: 265 K/uL (ref 150–400)
RBC: 4.41 MIL/uL (ref 3.87–5.11)
RDW: 14.5 % (ref 11.5–15.5)
WBC Count: 5.7 K/uL (ref 4.0–10.5)
nRBC: 0 % (ref 0.0–0.2)

## 2024-02-29 LAB — CMP (CANCER CENTER ONLY)
ALT: 7 U/L (ref 0–44)
AST: 18 U/L (ref 15–41)
Albumin: 4.4 g/dL (ref 3.5–5.0)
Alkaline Phosphatase: 75 U/L (ref 38–126)
Anion gap: 11 (ref 5–15)
BUN: 11 mg/dL (ref 8–23)
CO2: 26 mmol/L (ref 22–32)
Calcium: 10 mg/dL (ref 8.9–10.3)
Chloride: 104 mmol/L (ref 98–111)
Creatinine: 0.9 mg/dL (ref 0.44–1.00)
GFR, Estimated: >60 mL/min
Glucose, Bld: 100 mg/dL — ABNORMAL HIGH (ref 70–99)
Potassium: 3.8 mmol/L (ref 3.5–5.1)
Sodium: 141 mmol/L (ref 135–145)
Total Bilirubin: 0.3 mg/dL (ref 0.0–1.2)
Total Protein: 7.7 g/dL (ref 6.5–8.1)

## 2024-02-29 LAB — LACTATE DEHYDROGENASE: LDH: 179 U/L (ref 105–235)

## 2024-02-29 NOTE — Progress Notes (Unsigned)
 "  Office Visit Note  Patient: Vickie Pearson             Date of Birth: [birth year]           MRN: [id number]             PCP: Vicci Barnie NOVAK, MD Referring: Vicci Barnie NOVAK, MD Visit Date: 03/14/2024 Occupation: Data Unavailable  Subjective:  No chief complaint on file.   History of Present Illness: Vickie Pearson is a 69 y.o. female ***     Activities of Daily Living:  Patient reports morning stiffness for *** {minute/hour:19697}.   Patient {ACTIONS;DENIES/REPORTS:21021675::Denies} nocturnal pain.  Difficulty dressing/grooming: {ACTIONS;DENIES/REPORTS:21021675::Denies} Difficulty climbing stairs: {ACTIONS;DENIES/REPORTS:21021675::Denies} Difficulty getting out of chair: {ACTIONS;DENIES/REPORTS:21021675::Denies} Difficulty using hands for taps, buttons, cutlery, and/or writing: {ACTIONS;DENIES/REPORTS:21021675::Denies}  No Rheumatology ROS completed.   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   Hx of abnormal cervical Pap smear 07/24/2016   OA (osteoarthritis) of knee 07/24/2016    Past Medical History:  Diagnosis Date   Anxiety 08/27/20   Colon polyps    Coronary artery calcification of native artery 02/21/2019   Minimal calcification noted 11/2018 on coronary CTA   Costochondritis    DDD (degenerative disc disease), cervical    Degenerative arthritis of knee    Bilateral    Degenerative disc disease, cervical    Degenerative disc disease, cervical    Depression 08/27/20   Fibromyalgia    GERD (gastroesophageal reflux disease)    Hyperlipidemia    Inappropriate sinus tachycardia 02/21/2019   Monoclonal gammopathy    Multiple thyroid  nodules    Osteoporosis    Rheumatoid arthritis (HCC)     Family History  Problem Relation Age of Onset   Hypertension Mother    Colon polyps Mother    Stroke Mother    Arthritis Mother    Hypertension Father    Heart disease Father    Arthritis Father    Kidney disease Father    Hypertension Sister    Hyperlipidemia Sister    Hypertension Brother    Hyperlipidemia Brother    Esophageal cancer Neg Hx    Rectal cancer Neg Hx    Stomach cancer Neg Hx    Osteoporosis Neg Hx    Breast cancer Neg Hx    Past Surgical History:  Procedure Laterality Date   BREAST BIOPSY Right 2022   COLONOSCOPY  2012   COLONOSCOPY  2015   POLYPECTOMY     Social History[1] Social History   Social History Narrative   Not on file     Immunization History  Administered Date(s) Administered   Fluad Quad(high Dose 65+) 02/21/2020   INFLUENZA, HIGH DOSE SEASONAL PF 11/10/2018   Influenza, Quadrivalent, Recombinant, Inj, Pf 11/15/2017   Influenza,inj,Quad PF,6+ Mos 11/09/2016, 11/10/2018   Influenza-Unspecified 10/22/2021, 11/26/2023   PFIZER(Purple Top)SARS-COV-2 Vaccination 03/17/2019, 04/07/2019, 11/17/2019   PNEUMOCOCCAL CONJUGATE-20 10/20/2021   Pfizer(Comirnaty)Fall Seasonal Vaccine 12 years and older 12/08/2022     Objective: Vital Signs:  There were no vitals taken for this visit.   Physical Exam   Musculoskeletal Exam: ***  CDAI Exam: CDAI Score: -- Patient Global: --; Provider Global: -- Swollen: --; Tender: -- Joint Exam 03/14/2024   No joint exam has been documented for this visit   There is currently no information documented on the homunculus. Go to the Rheumatology activity and complete the homunculus  joint exam.  Investigation: No additional findings.  Imaging: No results found.  Recent Labs: Lab Results  Component Value Date   WBC 5.7 02/29/2024   HGB 13.7 02/29/2024   PLT 265 02/29/2024   NA 141 02/29/2024   K 3.8 02/29/2024   CL 104 02/29/2024   CO2 26 02/29/2024   GLUCOSE 100 (H) 02/29/2024   BUN 11 02/29/2024   CREATININE 0.90 02/29/2024   BILITOT 0.3 02/29/2024   ALKPHOS 75 02/29/2024   AST 18 02/29/2024   ALT 7 02/29/2024   PROT 7.7 02/29/2024   ALBUMIN 4.4 02/29/2024   CALCIUM 10.0 02/29/2024   GFRAA 78 07/02/2020    Speciality Comments: No specialty comments available.  Procedures:  No procedures performed Allergies: Penicillins, Piroxicam, Statins, Sulfa antibiotics, and Zetia  [ezetimibe ]   Assessment / Plan:     Visit Diagnoses: No diagnosis found.  Orders: No orders of the defined types were placed in this encounter.  No orders of the defined types were placed in this encounter.   Face-to-face time spent with patient was *** minutes. Greater than 50% of time was spent in counseling and coordination of care.  Follow-Up Instructions: No follow-ups on file.   Daved JAYSON Gavel, CMA  Note - This record has been created using Animal nutritionist.  Chart creation errors have been sought, but may not always  have been located. Such creation errors do not reflect on  the standard of medical care.    [1]  Social History Tobacco Use   Smoking status: Former    Current packs/day: 0.00    Average packs/day: 1.0 packs/day    Types: Cigarettes    Quit date: 07/24/1988    Years since quitting: 35.6    Passive exposure: Never   Smokeless tobacco: Never  Vaping Use   Vaping status: Never Used  Substance Use Topics   Alcohol use: Not Currently   Drug use: No   "

## 2024-03-01 LAB — KAPPA/LAMBDA LIGHT CHAINS
Kappa free light chain: 17.7 mg/L (ref 3.3–19.4)
Kappa, lambda light chain ratio: 1.26 (ref 0.26–1.65)
Lambda free light chains: 14.1 mg/L (ref 5.7–26.3)

## 2024-03-02 ENCOUNTER — Telehealth: Payer: Self-pay | Admitting: Hematology and Oncology

## 2024-03-02 LAB — MULTIPLE MYELOMA PANEL, SERUM
Albumin SerPl Elph-Mcnc: 3.6 g/dL (ref 2.9–4.4)
Albumin/Glob SerPl: 1.1 (ref 0.7–1.7)
Alpha 1: 0.2 g/dL (ref 0.0–0.4)
Alpha2 Glob SerPl Elph-Mcnc: 0.8 g/dL (ref 0.4–1.0)
B-Globulin SerPl Elph-Mcnc: 1.1 g/dL (ref 0.7–1.3)
Gamma Glob SerPl Elph-Mcnc: 1.2 g/dL (ref 0.4–1.8)
Globulin, Total: 3.4 g/dL (ref 2.2–3.9)
IgA: 276 mg/dL (ref 87–352)
IgG (Immunoglobin G), Serum: 1264 mg/dL (ref 586–1602)
IgM (Immunoglobulin M), Srm: 54 mg/dL (ref 26–217)
M Protein SerPl Elph-Mcnc: 0.2 g/dL — ABNORMAL HIGH
Total Protein ELP: 7 g/dL (ref 6.0–8.5)

## 2024-03-02 NOTE — Telephone Encounter (Signed)
 Pt called me to schedule upcoming appt due to bad weather

## 2024-03-06 ENCOUNTER — Ambulatory Visit: Payer: Medicare Other | Admitting: Hematology and Oncology

## 2024-03-07 NOTE — Progress Notes (Unsigned)
 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 Cala at [facility]. M protein 0.2, kappa 10.1, lambda 9.6, ratio 1.05. Met survey 11/24/2016 showed no evidence of lytic lesions.  2) 11/10/2017: M protein 0.1, kappa 12.5, lambda 8.2, ratio 1.52.  3) 02/14/2019: establish care with Dr. Federico   Interval History:  Vickie Pearson 69 y.o. female with medical history significant for IgA kappa MGUS presents for a follow up visit. The patient's last visit was on 03/01/2023. In the interim since the last visit has had no major changes in her health.  On exam today Ms. Pearson Vickie notes she is been well overall in interim since her last visit with no major changes in her health.  She reports that she did start a new medication for cholesterol but has otherwise been stable.  She has had no hospitalizations or ER visits.  She reports she had a viral illness recently where she had some cough but no fever.  She does not think it was the flu.  She has had no issues with nausea, vomiting, or diarrhea.  She reports does have some occasional neck pain from her degenerative disc disease but has had no other new bone or back pain.  Her weight has been stable.  Overall she feels well and has no additional questions concerns or complaints.  A full 10 point ROS is otherwise negative.  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   Monoclonal gammopathy    Multiple thyroid  nodules    Osteoporosis    Rheumatoid arthritis (HCC)     SURGICAL HISTORY: Past Surgical History:  Procedure Laterality Date   BREAST BIOPSY Right 2022   COLONOSCOPY  2012   COLONOSCOPY  2015   POLYPECTOMY      SOCIAL HISTORY: Social History   Socioeconomic History   Marital status: Widowed    Spouse name: Not on file   Number of children: Not on file   Years of education: Not on file   Highest education level: Bachelor's degree (e.g., BA, AB, BS)  Occupational History   Not on file  Tobacco Use   Smoking status: Former    Current packs/day: 0.00    Average packs/day: 1.0 packs/day    Types: Cigarettes    Quit date: 07/24/1988    Years since quitting: 35.6    Passive exposure: Never   Smokeless tobacco: Never  Vaping Use   Vaping status: Never Used  Substance and Sexual Activity   Alcohol use: Not Currently   Drug use: No  Sexual activity: Not Currently    Birth control/protection: Abstinence  Other Topics Concern   Not on file  Social History Narrative   Not on file   Social Drivers of Health   Tobacco Use: Medium Risk (01/27/2024)   Patient History    Smoking Tobacco Use: Former    Smokeless Tobacco Use: Never    Passive Exposure: Never  Physicist, Medical Strain: Low Risk (08/27/2023)   Overall Financial Resource Strain (CARDIA)    Difficulty of Paying Living Expenses: Not very hard  Food Insecurity: No Food Insecurity (08/27/2023)   Epic    Worried About Programme Researcher, Broadcasting/film/video  in the Last Year: Never true    Ran Out of Food in the Last Year: Never true  Transportation Needs: No Transportation Needs (08/27/2023)   Epic    Lack of Transportation (Medical): No    Lack of Transportation (Non-Medical): No  Physical Activity: Sufficiently Active (08/27/2023)   Exercise Vital Sign    Days of Exercise per Week: 4 days    Minutes of Exercise per Session: 60 min  Stress: Stress Concern Present (08/27/2023)   [employer] of Occupational Health - Occupational Stress Questionnaire    Feeling of Stress: To some extent  Social Connections: Moderately Integrated (08/27/2023)   Social Connection and Isolation Panel    Frequency of Communication with Friends and Family: More than three times a week    Frequency of Social Gatherings with Friends and Family: Three times a week    Attends Religious Services: More than 4 times per year    Active Member of Clubs or Organizations: Yes    Attends Banker Meetings: 1 to 4 times per year    Marital Status: Widowed  Intimate Partner Violence: Not At Risk (08/31/2023)   Epic    Fear of Current or Ex-Partner: No    Emotionally Abused: No    Physically Abused: No    Sexually Abused: No  Depression (PHQ2-9): Low Risk (08/31/2023)   Depression (PHQ2-9)    PHQ-2 Score: 3  Alcohol Screen: Low Risk (08/27/2023)   Alcohol Screen    Last Alcohol Screening Score (AUDIT): 0  Housing: Low Risk (08/27/2023)   Epic    Unable to Pay for Housing in the Last Year: No    Number of Times Moved in the Last Year: 0    Homeless in the Last Year: No  Utilities: Not At Risk (08/31/2023)   Epic    Threatened with loss of utilities: No  Health Literacy: Adequate Health Literacy (08/31/2023)   B1300 Health Literacy    Frequency of need for help with medical instructions: Never    FAMILY HISTORY: Family History  Problem Relation Age of Onset   Hypertension Mother    Colon polyps Mother    Stroke Mother    Arthritis Mother     Hypertension Father    Heart disease Father    Arthritis Father    Kidney disease Father    Hypertension Sister    Hyperlipidemia Sister    Hypertension Brother    Hyperlipidemia Brother    Esophageal cancer Neg Hx    Rectal cancer Neg Hx    Stomach cancer Neg Hx    Osteoporosis Neg Hx    Breast cancer Neg Hx     ALLERGIES:  is allergic to penicillins, piroxicam, statins, sulfa antibiotics, and zetia  [ezetimibe ].  MEDICATIONS:  Current Outpatient Medications  Medication Sig Dispense Refill   cholecalciferol (VITAMIN D ) 1000 units  tablet Take 3,000 Units by mouth daily.     Evolocumab  (REPATHA  SURECLICK) 140 MG/ML SOAJ INJECT 140 MG INTO THE SKIN EVERY 14 DAYS 6 mL 3   ezetimibe  (ZETIA ) 10 MG tablet Take 1 tablet (10 mg total) by mouth daily. 90 tablet 3   fluticasone (FLONASE) 50 MCG/ACT nasal spray Place 1 spray into both nostrils as needed for allergies or rhinitis.     hydrOXYzine  (VISTARIL ) 25 MG capsule Take 1 capsule (25 mg total) by mouth 2 (two) times daily as needed for anxiety. Please make PCP appt. 30 capsule 0   Lactobacillus (PROBIOTIC ACIDOPHILUS PO) Take 1 capsule by mouth daily.     Magnesium Citrate 100 MG TABS Take by mouth daily as needed.     metoprolol  succinate (TOPROL -XL) 25 MG 24 hr tablet Take 1 tablet (25 mg total) by mouth daily. 90 tablet 3   omeprazole  (PRILOSEC) 20 MG capsule Take 1 capsule (20 mg total) by mouth daily. Take at least 3 weeks to see if cough improves 30 capsule 3   vitamin B-12 (CYANOCOBALAMIN) 1000 MCG tablet Take 1 tablet by mouth daily.     zinc sulfate 220 (50 Zn) MG capsule Take by mouth daily.      Zoledronic  Acid (RECLAST  IV)      No current facility-administered medications for this visit.    REVIEW OF SYSTEMS:   All other systems were reviewed with the patient and are negative.  PHYSICAL EXAMINATION: NO PHYSICAL EXAM-- telephone visit.   LABORATORY DATA:  I have reviewed the data as listed    Latest Ref Rng & Units  02/29/2024   11:11 AM 02/22/2023    2:11 PM 02/20/2022   10:32 AM  CBC  WBC 4.0 - 10.5 K/uL 5.7  5.7  5.5   Hemoglobin 12.0 - 15.0 g/dL 86.2  86.0  86.9   Hematocrit 36.0 - 46.0 % 41.8  42.5  39.8   Platelets 150 - 400 K/uL 265  273  256        Latest Ref Rng & Units 02/29/2024   11:11 AM 11/24/2023   10:32 AM 02/22/2023    2:11 PM  CMP  Glucose 70 - 99 mg/dL 899  80  93   BUN 8 - 23 mg/dL 11  13  10    Creatinine 0.44 - 1.00 mg/dL 9.09  9.13  9.17   Sodium 135 - 145 mmol/L 141  139  135   Potassium 3.5 - 5.1 mmol/L 3.8  4.2  4.0   Chloride 98 - 111 mmol/L 104  105  104   CO2 22 - 32 mmol/L 26  26  26    Calcium 8.9 - 10.3 mg/dL 89.9  9.9  9.5   Total Protein 6.5 - 8.1 g/dL 7.7   7.6   Total Bilirubin 0.0 - 1.2 mg/dL 0.3   0.3   Alkaline Phos 38 - 126 U/L 75   66   AST 15 - 41 U/L 18   15   ALT 0 - 44 U/L 7   6     Lab Results  Component Value Date   MPROTEIN 0.2 (H) 02/29/2024   MPROTEIN Not Observed 02/22/2023   MPROTEIN Comment (A) 02/20/2022   Lab Results  Component Value Date   KPAFRELGTCHN 17.7 02/29/2024   KPAFRELGTCHN 19.8 (H) 02/22/2023   KPAFRELGTCHN 22.7 (H) 02/20/2022   LAMBDASER 14.1 02/29/2024   LAMBDASER 12.2 02/22/2023   LAMBDASER 13.4 02/20/2022   KAPLAMBRATIO 1.26 02/29/2024  KAPLAMBRATIO 7.69 03/01/2023   KAPLAMBRATIO 1.62 02/22/2023    RADIOGRAPHIC STUDIES: No results found.  ASSESSMENT & PLAN Vickie Pearson 69 y.o. female with medical history significant for IgA kappa MGUS  presents for a follow up visit.    After review of the labs and discussion with the patient, her finding is most consistent with monoclonal gammopathy of undetermined significance.  It appears at this has been very stable over the last 2 years, with a very low M protein.  Given these findings I think would be reasonable to continue to observe on a yearly basis with SPEP, UPEP, and serum free light chains.   Technically the patient meets the criteria for a bone marrow  biopsy with an IgA MGUS, however given the extremely low levels of M protein and the lack of any anemia or increased calcium I think that observation would be appropriate at this time.   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 --last metastatic survey normal in Jan 2025. Can repeat in 2027.  --M protein is only mildly elevated and Hgb/Ca/Cr are WNL. As such I recommend holding on a bone marrow biopsy at this time. --labs today show white blood cell 5.7, Hgb 13.7, MCV 94.8, Plt 265   --SFLC stable with normal ratio, M protein levels 0.2 --RTC in 1 years time for repeat serologies  No orders of the defined types were placed in this encounter.   All questions were answered. The patient knows to call the clinic with any problems, questions or concerns.  A total of more than 25 minutes were spent on this encounter and over half of that time was spent on counseling and coordination of care as outlined above.   Norleen IVAR Kidney, MD Department of Hematology/Oncology [facility] Phone: [phone] Pager: [phone] Email: [email or web address]  03/08/2024 3:53 PM "

## 2024-03-08 ENCOUNTER — Inpatient Hospital Stay: Admitting: Hematology and Oncology

## 2024-03-08 DIAGNOSIS — D472 Monoclonal gammopathy: Secondary | ICD-10-CM

## 2024-03-14 ENCOUNTER — Ambulatory Visit: Admitting: Rheumatology

## 2024-03-14 DIAGNOSIS — M51369 Other intervertebral disc degeneration, lumbar region without mention of lumbar back pain or lower extremity pain: Secondary | ICD-10-CM

## 2024-03-14 DIAGNOSIS — E559 Vitamin D deficiency, unspecified: Secondary | ICD-10-CM

## 2024-03-14 DIAGNOSIS — M0609 Rheumatoid arthritis without rheumatoid factor, multiple sites: Secondary | ICD-10-CM

## 2024-03-14 DIAGNOSIS — E782 Mixed hyperlipidemia: Secondary | ICD-10-CM

## 2024-03-14 DIAGNOSIS — M810 Age-related osteoporosis without current pathological fracture: Secondary | ICD-10-CM

## 2024-03-14 DIAGNOSIS — H2013 Chronic iridocyclitis, bilateral: Secondary | ICD-10-CM

## 2024-03-14 DIAGNOSIS — M5030 Other cervical disc degeneration, unspecified cervical region: Secondary | ICD-10-CM

## 2024-03-14 DIAGNOSIS — G8929 Other chronic pain: Secondary | ICD-10-CM

## 2024-03-14 DIAGNOSIS — D472 Monoclonal gammopathy: Secondary | ICD-10-CM

## 2024-03-14 DIAGNOSIS — E042 Nontoxic multinodular goiter: Secondary | ICD-10-CM

## 2024-03-14 DIAGNOSIS — M170 Bilateral primary osteoarthritis of knee: Secondary | ICD-10-CM

## 2024-04-21 ENCOUNTER — Ambulatory Visit: Admitting: Rheumatology

## 2024-09-05 ENCOUNTER — Ambulatory Visit

## 2024-11-23 ENCOUNTER — Ambulatory Visit: Admitting: Internal Medicine
